# Patient Record
Sex: Male | Born: 1980 | Race: White | Hispanic: No | Marital: Married | Smoking: Current every day smoker
Health system: Southern US, Community
[De-identification: ages and names within clinical notes are randomized; demographics above are authoritative.]

## PROBLEM LIST (undated history)

## (undated) DIAGNOSIS — I2510 Atherosclerotic heart disease of native coronary artery without angina pectoris: Secondary | ICD-10-CM

## (undated) DIAGNOSIS — K219 Gastro-esophageal reflux disease without esophagitis: Secondary | ICD-10-CM

## (undated) DIAGNOSIS — M779 Enthesopathy, unspecified: Secondary | ICD-10-CM

## (undated) DIAGNOSIS — I739 Peripheral vascular disease, unspecified: Secondary | ICD-10-CM

## (undated) DIAGNOSIS — I6529 Occlusion and stenosis of unspecified carotid artery: Secondary | ICD-10-CM

## (undated) DIAGNOSIS — I219 Acute myocardial infarction, unspecified: Secondary | ICD-10-CM

## (undated) DIAGNOSIS — J939 Pneumothorax, unspecified: Secondary | ICD-10-CM

## (undated) DIAGNOSIS — I214 Non-ST elevation (NSTEMI) myocardial infarction: Secondary | ICD-10-CM

## (undated) HISTORY — DX: Enthesopathy, unspecified: M77.9

## (undated) HISTORY — DX: Non-ST elevation (NSTEMI) myocardial infarction: I21.4

## (undated) HISTORY — DX: Pneumothorax, unspecified: J93.9

## (undated) HISTORY — PX: APPENDECTOMY: SHX54

## (undated) HISTORY — DX: Gastro-esophageal reflux disease without esophagitis: K21.9

## (undated) HISTORY — DX: Occlusion and stenosis of unspecified carotid artery: I65.29

## (undated) HISTORY — DX: Atherosclerotic heart disease of native coronary artery without angina pectoris: I25.10

## (undated) HISTORY — DX: Acute myocardial infarction, unspecified: I21.9

---

## 2004-08-12 ENCOUNTER — Inpatient Hospital Stay: Payer: Self-pay | Admitting: Vascular Surgery

## 2010-05-25 ENCOUNTER — Emergency Department: Payer: Self-pay | Admitting: Internal Medicine

## 2010-06-18 ENCOUNTER — Inpatient Hospital Stay: Payer: Self-pay | Admitting: *Deleted

## 2010-07-08 ENCOUNTER — Ambulatory Visit: Payer: Self-pay | Admitting: Internal Medicine

## 2010-10-28 ENCOUNTER — Ambulatory Visit: Payer: Self-pay | Admitting: Internal Medicine

## 2011-04-22 DIAGNOSIS — I219 Acute myocardial infarction, unspecified: Secondary | ICD-10-CM

## 2011-04-22 HISTORY — DX: Acute myocardial infarction, unspecified: I21.9

## 2011-04-22 HISTORY — PX: CARDIAC CATHETERIZATION: SHX172

## 2011-04-22 HISTORY — PX: CORONARY ANGIOPLASTY: SHX604

## 2011-04-22 HISTORY — PX: CORONARY ARTERY BYPASS GRAFT: SHX141

## 2011-11-05 ENCOUNTER — Inpatient Hospital Stay: Payer: Self-pay | Admitting: Internal Medicine

## 2011-11-05 LAB — BASIC METABOLIC PANEL
Anion Gap: 11 (ref 7–16)
Creatinine: 1.02 mg/dL (ref 0.60–1.30)
EGFR (African American): >60
EGFR (Non-African Amer.): >60
Glucose: 101 mg/dL — ABNORMAL HIGH (ref 65–99)
Sodium: 141 mmol/L (ref 136–145)

## 2011-11-05 LAB — CBC
HCT: 48.5 % (ref 40.0–52.0)
HGB: 16.1 g/dL (ref 13.0–18.0)
MCH: 31.4 pg (ref 26.0–34.0)
MCV: 94 fL (ref 80–100)
Platelet: 201 10*3/uL (ref 150–440)
RBC: 5.14 10*6/uL (ref 4.40–5.90)
RDW: 12.2 % (ref 11.5–14.5)
WBC: 14.8 10*3/uL — ABNORMAL HIGH (ref 3.8–10.6)

## 2011-11-05 LAB — CK TOTAL AND CKMB (NOT AT ARMC): CK-MB: 2.1 ng/mL (ref 0.5–3.6)

## 2011-11-05 LAB — TROPONIN I: Troponin-I: 0.28 ng/mL — ABNORMAL HIGH

## 2011-11-12 ENCOUNTER — Encounter: Payer: Self-pay | Admitting: Cardiovascular Disease

## 2011-11-20 ENCOUNTER — Emergency Department: Payer: Self-pay | Admitting: Emergency Medicine

## 2011-11-20 HISTORY — PX: CARDIAC CATHETERIZATION: SHX172

## 2011-11-20 LAB — APTT: Activated PTT: 28.1 secs (ref 23.6–35.9)

## 2011-11-20 LAB — COMPREHENSIVE METABOLIC PANEL
Alkaline Phosphatase: 109 U/L (ref 50–136)
Calcium, Total: 9.2 mg/dL (ref 8.5–10.1)
Chloride: 104 mmol/L (ref 98–107)
Co2: 29 mmol/L (ref 21–32)
EGFR (African American): >60
EGFR (Non-African Amer.): >60
Glucose: 142 mg/dL — ABNORMAL HIGH (ref 65–99)
Osmolality: 282 (ref 275–301)
SGPT (ALT): 73 U/L
Sodium: 140 mmol/L (ref 136–145)

## 2011-11-20 LAB — CBC
HGB: 13.2 g/dL (ref 13.0–18.0)
MCH: 31.4 pg (ref 26.0–34.0)
MCHC: 34.1 g/dL (ref 32.0–36.0)
MCV: 92 fL (ref 80–100)
Platelet: 516 10*3/uL — ABNORMAL HIGH (ref 150–440)
RBC: 4.21 10*6/uL — ABNORMAL LOW (ref 4.40–5.90)
RDW: 12.5 % (ref 11.5–14.5)

## 2011-11-20 LAB — CK TOTAL AND CKMB (NOT AT ARMC)
CK, Total: 102 U/L (ref 35–232)
CK, Total: 90 U/L (ref 35–232)
CK-MB: <0.5 ng/mL — ABNORMAL LOW (ref 0.5–3.6)

## 2011-11-20 LAB — TROPONIN I: Troponin-I: 0.29 ng/mL — ABNORMAL HIGH

## 2011-11-20 LAB — PROTIME-INR: INR: 1

## 2011-11-27 ENCOUNTER — Encounter: Payer: Self-pay | Admitting: Cardiovascular Disease

## 2011-11-27 ENCOUNTER — Encounter: Payer: Self-pay | Admitting: *Deleted

## 2011-11-28 ENCOUNTER — Encounter: Payer: Self-pay | Admitting: Cardiovascular Disease

## 2011-11-28 ENCOUNTER — Ambulatory Visit (INDEPENDENT_AMBULATORY_CARE_PROVIDER_SITE_OTHER): Payer: 59 | Admitting: Cardiovascular Disease

## 2011-11-28 VITALS — BP 98/60 | HR 89 | Ht 74.0 in | Wt 252.8 lb

## 2011-11-28 DIAGNOSIS — I2510 Atherosclerotic heart disease of native coronary artery without angina pectoris: Secondary | ICD-10-CM | POA: Insufficient documentation

## 2011-11-28 DIAGNOSIS — Z951 Presence of aortocoronary bypass graft: Secondary | ICD-10-CM | POA: Insufficient documentation

## 2011-11-28 NOTE — Progress Notes (Signed)
Patient ID: Brandon Parks, male    DOB: [birth year], 31 y.o.   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 Balloon pump was placed and he was transferred to [facility] on Integrilin and heparin infusion. He underwent bypass surgery with LIMA to the LAD, vein graft to the OM, vein graft to the diagonal.  He developed recurrent chest pain/burning injury August and presented to [facility] and again was transferred to [facility] with repeat catheterization. He reports that something was "blocked up" but the details are not available to [place] at this time. He is recovering from his surgery. He denies any significant chest burning. 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  He is out of work, previously worked in Production designer, theatre/television/film. He does not have any further followup at [facility]. He was planning on being out of work for 2 months and then going back with light duty.  EKG today shows normal sinus rhythm with rate 18 beats per minute, T-wave abnormality V1 through V6, 1 and aVL   Outpatient Encounter Prescriptions as of 11/28/2011  Medication Sig Dispense Refill  . aspirin 81 MG tablet Take 81 mg by mouth daily.      Marland Kitchen atorvastatin (LIPITOR) 20 MG tablet Take 20 mg by mouth daily.      . clopidogrel (PLAVIX) 75 MG tablet Take 75 mg by mouth daily.      Marland Kitchen lisinopril (PRINIVIL,ZESTRIL) 2.5 MG tablet Take 2.5 mg by mouth daily.      . metoprolol tartrate (LOPRESSOR) 25 MG tablet Take 25 mg by mouth 2 (two) times daily.      . nitroGLYCERIN (NITROSTAT) 0.4 MG SL tablet Place 0.4 mg under the tongue every 5 (five) minutes as needed.      Marland Kitchen oxyCODONE (OXY IR/ROXICODONE) 5 MG immediate release tablet Takes 1-2 tablets every 6 hours as needed for pain.         Review of Systems  Constitutional: Negative.   HENT: Negative.   Eyes: Negative.   Respiratory: Negative.   Cardiovascular: Negative.   Gastrointestinal: Negative.   Musculoskeletal: Negative.   Skin: Negative.   Neurological: Negative.   Hematological: Negative.   Psychiatric/Behavioral: Negative.   All other systems reviewed and are negative.    BP 98/60  Pulse 89  Ht 6\' 2"  (1.88 m)  Wt 252 lb 12 oz (114.647 kg)  BMI 32.45 kg/m2  Physical Exam  Nursing note and vitals reviewed. Constitutional: He is oriented to person, place, and time. He appears well-developed and well-nourished.  HENT:  Head: Normocephalic.  Nose: Nose normal.  Mouth/Throat: Oropharynx is clear and moist.  Eyes: Conjunctivae are normal. Pupils are equal, round, and reactive to light.  Neck: Normal range of motion. Neck supple. No JVD present.  Cardiovascular: Normal rate, regular rhythm, S1 normal, S2 normal, normal heart sounds and intact distal pulses.  Exam reveals no gallop and no friction rub.   No murmur heard.      Well-healed mediastinal scar  Pulmonary/Chest: Effort normal and breath sounds normal. No respiratory distress. He has no wheezes. He has no rales. He exhibits no tenderness.  Abdominal: Soft. Bowel sounds are normal. He exhibits no distension. There is no tenderness.  Musculoskeletal: Normal range of motion. He exhibits no edema and no  tenderness.  Lymphadenopathy:    He has no cervical adenopathy.  Neurological: He is alert and oriented to person, place, and time. Coordination normal.  Skin: Skin is warm and dry. No rash noted. No erythema.  Psychiatric: He has a normal mood and affect. His behavior is normal. Judgment and thought content normal.           Assessment and Plan

## 2011-11-28 NOTE — Assessment & Plan Note (Signed)
Initial catheterization showing complex ostial and proximal LAD stenosis and extensive thrombus into the left main. Treated with bypass surgery. Currently with no chest burning

## 2011-11-28 NOTE — Assessment & Plan Note (Addendum)
July 2013 he had CABG x3. Catheterization in August 2013. Results are not available. We will try to obtain this for our records. He will likely need to be out of work for several months, then back on light duty. Possible full work at 3 months.

## 2011-11-28 NOTE — Patient Instructions (Addendum)
You are doing well. No medication changes were made.  We will set up heart track at [facility]  Please call us if you have new issues that need to be addressed before your next appt.  Your physician wants you to follow-up in: 3 months.  You will receive a reminder letter in the mail two months in advance. If you don't receive a letter, please call our office to schedule the follow-up appointment.

## 2011-12-02 ENCOUNTER — Telehealth: Payer: Self-pay | Admitting: Cardiovascular Disease

## 2011-12-02 ENCOUNTER — Other Ambulatory Visit: Payer: Self-pay

## 2011-12-02 MED ORDER — VARENICLINE TARTRATE 0.5 MG X 11 & 1 MG X 42 PO MISC: ORAL | Status: DC

## 2011-12-02 NOTE — Telephone Encounter (Signed)
Rx sent to [facility] and Mrs Cantara was notified.

## 2011-12-02 NOTE — Telephone Encounter (Signed)
Pt called wanting to know if he could get a script for Chantix. [facility] [address] [place]. Please call pt wife if called in [phone].

## 2011-12-03 ENCOUNTER — Other Ambulatory Visit: Payer: Self-pay | Admitting: *Deleted

## 2011-12-03 MED ORDER — VARENICLINE TARTRATE 0.5 MG X 11 & 1 MG X 42 PO MISC: ORAL | Status: AC

## 2011-12-03 NOTE — Telephone Encounter (Signed)
Refilled Chantix

## 2011-12-09 ENCOUNTER — Ambulatory Visit: Payer: Self-pay | Admitting: Cardiovascular Disease

## 2011-12-10 ENCOUNTER — Ambulatory Visit: Payer: Self-pay | Admitting: Cardiovascular Disease

## 2011-12-11 ENCOUNTER — Telehealth: Payer: Self-pay

## 2011-12-11 NOTE — Telephone Encounter (Signed)
Pt had CABG x 1 month ago at [facility]. He c/o lower portion of sternal incision being "opened" and draining. He says drainage was at first, clear but now is white.  I advised him to call [facility] office ASAP to tell them what is going on. I explained he probably needs to be seen. I gave him their #. He verb. Understanding and will call them now.

## 2012-04-08 ENCOUNTER — Telehealth: Payer: Self-pay | Admitting: Cardiovascular Disease

## 2012-04-08 NOTE — Telephone Encounter (Signed)
Pt wife called wanting to know if pt is ok to get into hot tub and has other questions for nurse

## 2012-04-08 NOTE — Telephone Encounter (Signed)
Pts wife asks if ok for pt to get in hot tub.  I advised against this, given the fact that he is not quite 6 months post CABG and is taking beta blockers. She verb. Understanding. She also asks if pt is cleared for cortisone injections in shoulder for tendonitis.  She reminds me pt is hesitant since he was on prednisone "when he had his heart attack". I told wife I would discuss with Dr. Mariah Milling and let her know if he is cleared to have cortisone injections. Understanding verb

## 2012-04-08 NOTE — Telephone Encounter (Signed)
Pt wife called wanting to speak to nurse. Ask if ok for pt to get into hot tub after CABG

## 2012-04-14 NOTE — Telephone Encounter (Signed)
Cortisone should be ok Should time in hot tub ok, 5 to 10 min, not too hot

## 2012-04-15 NOTE — Telephone Encounter (Signed)
Pt's wife informed Understanding verb

## 2012-05-26 ENCOUNTER — Ambulatory Visit: Payer: 59 | Admitting: Cardiovascular Disease

## 2012-05-26 ENCOUNTER — Encounter: Payer: Self-pay | Admitting: *Deleted

## 2012-06-11 ENCOUNTER — Ambulatory Visit: Payer: 59 | Admitting: Cardiovascular Disease

## 2012-06-11 ENCOUNTER — Ambulatory Visit (INDEPENDENT_AMBULATORY_CARE_PROVIDER_SITE_OTHER): Payer: 59 | Admitting: Cardiovascular Disease

## 2012-06-11 ENCOUNTER — Encounter: Payer: Self-pay | Admitting: Cardiovascular Disease

## 2012-06-11 VITALS — BP 112/88 | HR 78 | Ht 74.0 in | Wt 269.0 lb

## 2012-06-11 DIAGNOSIS — I25810 Atherosclerosis of coronary artery bypass graft(s) without angina pectoris: Secondary | ICD-10-CM

## 2012-06-11 DIAGNOSIS — Z79899 Other long term (current) drug therapy: Secondary | ICD-10-CM

## 2012-06-11 DIAGNOSIS — E785 Hyperlipidemia, unspecified: Secondary | ICD-10-CM | POA: Insufficient documentation

## 2012-06-11 DIAGNOSIS — I10 Essential (primary) hypertension: Secondary | ICD-10-CM

## 2012-06-11 DIAGNOSIS — Z951 Presence of aortocoronary bypass graft: Secondary | ICD-10-CM

## 2012-06-11 DIAGNOSIS — R079 Chest pain, unspecified: Secondary | ICD-10-CM

## 2012-06-11 MED ORDER — ATORVASTATIN CALCIUM 20 MG PO TABS: 20.0000 mg | ORAL_TABLET | Freq: Every day | ORAL | Status: DC

## 2012-06-11 MED ORDER — LISINOPRIL 2.5 MG PO TABS: 2.5000 mg | ORAL_TABLET | Freq: Every day | ORAL | Status: DC

## 2012-06-11 MED ORDER — CLOPIDOGREL BISULFATE 75 MG PO TABS: 75.0000 mg | ORAL_TABLET | Freq: Every day | ORAL | Status: DC

## 2012-06-11 MED ORDER — METOPROLOL TARTRATE 25 MG PO TABS: 25.0000 mg | ORAL_TABLET | Freq: Two times a day (BID) | ORAL | Status: DC

## 2012-06-11 MED ORDER — NITROGLYCERIN 0.4 MG SL SUBL: 0.4000 mg | SUBLINGUAL_TABLET | SUBLINGUAL | Status: DC | PRN

## 2012-06-11 NOTE — Assessment & Plan Note (Signed)
Rare brief chest pain. We have suggested it pain scale more frequent or more severe, lasting longer, he take nitroglycerin sublingual and call the office.

## 2012-06-11 NOTE — Assessment & Plan Note (Signed)
Continue his current cardiac regimen. He is on aspirin, Plavix, beta blocker, low-dose ACE inhibitor.

## 2012-06-11 NOTE — Assessment & Plan Note (Signed)
We'll check lipid panel and LFTs today

## 2012-06-11 NOTE — Progress Notes (Signed)
Patient ID: Brandon Parks, male    DOB: 07-30-1980, 32 y.o.   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 Balloon pump was placed and he was transferred to [facility] on Integrilin and heparin infusion. He underwent bypass surgery with LIMA to the LAD, vein graft to the OM, vein graft to the diagonal.   recurrent chest pain/burning injury August  2013 and presented to [facility] and again was transferred to [facility] with repeat catheterization. He reports that something was "blocked up" but the details are not available to [place]. He  recovered from his surgery. He denies any significant chest burning. 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  He does report having periodic discomfort from the stent mediastinal incision. He often feels he is back to normal and lifts something heavy. He reports lifting a 4 wheeler by himself and he hurt his chest and has not been back at work since then, only doing light work. He has numbness down his left leg when he is on the ground for prolonged period of time.  Possibly still smoking a small amount, using nicotine supplement  EKG today shows normal sinus rhythm with rate 78 beats per minute, T-wave abnormality 1 and aVL   Outpatient Encounter Prescriptions as of 06/11/2012  Medication Sig Dispense Refill  . aspirin 81 MG tablet Take 81 mg by mouth daily.      Marland Kitchen atorvastatin (LIPITOR) 20 MG tablet Take 1 tablet (20 mg total) by mouth daily.  90 tablet  4  . clopidogrel (PLAVIX) 75 MG tablet Take 1 tablet (75 mg total) by mouth daily.  90 tablet  4  . lisinopril (PRINIVIL,ZESTRIL) 2.5 MG tablet Take 1 tablet (2.5 mg total) by mouth daily.  90 tablet  4  . metoprolol tartrate (LOPRESSOR) 25 MG tablet  Take 1 tablet (25 mg total) by mouth 2 (two) times daily.  180 tablet  4  . nitroGLYCERIN (NITROSTAT) 0.4 MG SL tablet Place 1 tablet (0.4 mg total) under the tongue every 5 (five) minutes as needed.  25 tablet  6     Review of Systems  Constitutional: Negative.   HENT: Negative.   Eyes: Negative.   Respiratory: Negative.   Cardiovascular: Positive for chest pain.  Gastrointestinal: Negative.   Musculoskeletal: Negative.   Skin: Negative.   Neurological: Negative.   Psychiatric/Behavioral: Negative.   All other systems reviewed and are negative.    BP 112/88  Pulse 78  Ht 6\' 2"  (1.88 m)  Wt 269 lb (122.018 kg)  BMI 34.52 kg/m2  Physical Exam  Nursing note and vitals reviewed. Constitutional: He is oriented to person, place, and time. He appears well-developed and well-nourished.  HENT:  Head: Normocephalic.  Nose: Nose normal.  Mouth/Throat: Oropharynx is clear and moist.  Eyes: Conjunctivae are normal. Pupils are equal, round, and reactive to light.  Neck: Normal range of motion. Neck supple. No JVD present.  Cardiovascular: Normal rate, regular rhythm, S1 normal, S2 normal, normal heart sounds and intact distal pulses.  Exam reveals no gallop and no friction rub.   No murmur heard. Well-healed mediastinal scar  Pulmonary/Chest: Effort normal and breath sounds normal. No respiratory distress. He has no wheezes. He has no rales. He exhibits no tenderness.  Abdominal: Soft. Bowel  sounds are normal. He exhibits no distension. There is no tenderness.  Musculoskeletal: Normal range of motion. He exhibits no edema and no tenderness.  Lymphadenopathy:    He has no cervical adenopathy.  Neurological: He is alert and oriented to person, place, and time. Coordination normal.  Skin: Skin is warm and dry. No rash noted. No erythema.  Psychiatric: He has a normal mood and affect. His behavior is normal. Judgment and thought content normal.      Assessment and Plan

## 2012-06-11 NOTE — Patient Instructions (Addendum)
You are doing well. No medication changes were made.  We will check your lab work today  Please call us if you have new issues that need to be addressed before your next appt.  Your physician wants you to follow-up in: 6 months.  You will receive a reminder letter in the mail two months in advance. If you don't receive a letter, please call our office to schedule the follow-up appointment.

## 2012-06-18 ENCOUNTER — Telehealth: Payer: Self-pay

## 2012-06-18 NOTE — Telephone Encounter (Signed)
Message copied by Festus Aloe on Fri Jun 18, 2012  9:36 AM ------      Message from: [facility], MELISSA E      Created: Thu Jun 17, 2012  8:07 AM      Regarding: FW: labs                   ----- Message -----         From: Antonieta Iba, MD         Sent: 06/16/2012   6:17 PM           To: Marcelle Overlie, RN      Subject: RE: labs                                                 Cholesterol is okay on current dose of Lipitor      thx            ----- Message -----         From: Marcelle Overlie, RN         Sent: 06/14/2012   4:56 PM           To: Antonieta Iba, MD      Subject: labs                                                     fyi      Labs scanned in       ------

## 2012-06-18 NOTE — Telephone Encounter (Signed)
Patient notified lab work looks good and to continue on the current dose of lipitor.

## 2012-10-12 ENCOUNTER — Telehealth: Payer: Self-pay

## 2012-10-12 NOTE — Telephone Encounter (Signed)
Request from [facility], sent to [employer] on 10/12/2012 .

## 2012-10-27 ENCOUNTER — Telehealth: Payer: Self-pay

## 2012-10-27 NOTE — Telephone Encounter (Signed)
Request from [employer], sent to [employer] on 10/28/2012 .

## 2012-11-12 ENCOUNTER — Telehealth: Payer: Self-pay

## 2012-11-12 NOTE — Telephone Encounter (Signed)
Request from [employer]ability [employer] , sent to [employer] on 11/12/2012 .

## 2012-12-10 ENCOUNTER — Ambulatory Visit: Payer: 59 | Admitting: Cardiovascular Disease

## 2012-12-30 ENCOUNTER — Ambulatory Visit: Payer: Self-pay | Admitting: Cardiovascular Disease

## 2012-12-30 ENCOUNTER — Ambulatory Visit (INDEPENDENT_AMBULATORY_CARE_PROVIDER_SITE_OTHER): Payer: 59 | Admitting: Cardiovascular Disease

## 2012-12-30 ENCOUNTER — Encounter: Payer: Self-pay | Admitting: Cardiovascular Disease

## 2012-12-30 VITALS — BP 131/95 | HR 77 | Ht 74.0 in | Wt 266.8 lb

## 2012-12-30 DIAGNOSIS — I10 Essential (primary) hypertension: Secondary | ICD-10-CM

## 2012-12-30 DIAGNOSIS — E785 Hyperlipidemia, unspecified: Secondary | ICD-10-CM

## 2012-12-30 DIAGNOSIS — R079 Chest pain, unspecified: Secondary | ICD-10-CM

## 2012-12-30 DIAGNOSIS — Z951 Presence of aortocoronary bypass graft: Secondary | ICD-10-CM

## 2012-12-30 DIAGNOSIS — R0789 Other chest pain: Secondary | ICD-10-CM | POA: Insufficient documentation

## 2012-12-30 NOTE — Progress Notes (Signed)
Patient ID: Brandon Parks, male    DOB: 1980/11/22, 32 y.o.   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 Balloon pump was placed and he was transferred to [facility] on Integrilin and heparin infusion. He underwent bypass surgery with LIMA to the LAD, vein graft to the OM, vein graft to the diagonal.  He developed recurrent chest pain August  2013 and presented to [facility] and again was transferred to [facility] with repeat catheterization. He reports that something was "blocked up" but the details are not available to [place]. He  recovered from his surgery. He denies any significant chest burning. 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  He has had discomfort from the mediastinal incision  Starting approximately 2 months after the surgery. He reports that he was lifting something heavy and he hurt his mediastinum. Also hurt his chest a second time lifting a 4 wheeler by himself. He has pain when he stretches his arms out to his side. Pain is excruciating, left of the mediastinum, halfway down. Sometimes has pain with pushing, often with movement. He has pain daily, often several times per day  Possibly still smoking a small amount, using nicotine supplement  EKG today shows normal sinus rhythm with rate 78 beats per minute, T-wave abnormality 1 and aVL  Outpatient Encounter Prescriptions as of 12/30/2012  Medication Sig Dispense Refill  . aspirin 81 MG tablet Take 81 mg by mouth daily.      Marland Kitchen atorvastatin (LIPITOR) 20 MG tablet Take 1 tablet (20 mg total) by mouth daily.  90 tablet  4  . clopidogrel (PLAVIX) 75 MG tablet Take 1 tablet (75 mg total) by mouth daily.  90 tablet  4  . lisinopril (PRINIVIL,ZESTRIL) 2.5 MG tablet Take 1  tablet (2.5 mg total) by mouth daily.  90 tablet  4  . metoprolol tartrate (LOPRESSOR) 25 MG tablet Take 1 tablet (25 mg total) by mouth 2 (two) times daily.  180 tablet  4  . nitroGLYCERIN (NITROSTAT) 0.4 MG SL tablet Place 1 tablet (0.4 mg total) under the tongue every 5 (five) minutes as needed.  25 tablet  6  . oxyCODONE (OXY IR/ROXICODONE) 5 MG immediate release tablet Takes 1-2 tablets every 6 hours as needed for pain.       No facility-administered encounter medications on file as of 12/30/2012.     Review of Systems  Constitutional: Negative.   HENT: Negative.   Eyes: Negative.   Respiratory: Negative.   Cardiovascular: Positive for chest pain.  Gastrointestinal: Negative.   Musculoskeletal: Negative.   Skin: Negative.   Neurological: Negative.   Psychiatric/Behavioral: Negative.   All other systems reviewed and are negative.    BP 131/95  Pulse 77  Ht 6\' 2"  (1.88 m)  Wt 266 lb 12 oz (120.997 kg)  BMI 34.23 kg/m2  Physical Exam  Nursing note and vitals reviewed. Constitutional: He is oriented to person, place, and time. He appears well-developed and well-nourished.  HENT:  Head: Normocephalic.  Nose: Nose normal.  Mouth/Throat: Oropharynx is clear and moist.  Eyes: Conjunctivae are normal. Pupils are equal, round, and reactive to light.  Neck: Normal range of motion. Neck supple. No JVD present.  Cardiovascular: Normal rate, regular rhythm, S1 normal, S2 normal, normal heart  sounds and intact distal pulses.  Exam reveals no gallop and no friction rub.   No murmur heard. Well-healed mediastinal scar  Pulmonary/Chest: Effort normal and breath sounds normal. No respiratory distress. He has no wheezes. He has no rales. He exhibits no tenderness.  Abdominal: Soft. Bowel sounds are normal. He exhibits no distension. There is no tenderness.  Musculoskeletal: Normal range of motion. He exhibits no edema and no tenderness.  Lymphadenopathy:    He has no cervical  adenopathy.  Neurological: He is alert and oriented to person, place, and time. Coordination normal.  Skin: Skin is warm and dry. No rash noted. No erythema.  Psychiatric: He has a normal mood and affect. His behavior is normal. Judgment and thought content normal.      Assessment and Plan

## 2012-12-30 NOTE — Assessment & Plan Note (Signed)
He does report having vague daily chest pain which he feels is his heart, separate to his sternum pain. Options include long-acting nitrates for possible spasm, we'll also try ranexa 500 mg twice a day titrating up to 1000 mg twice a day. If no improvement on ranexa, we could hold the medication and start Imdur 30 mg daily.

## 2012-12-30 NOTE — Assessment & Plan Note (Signed)
No changes made to the medications.

## 2012-12-30 NOTE — Assessment & Plan Note (Signed)
Encouraged him to stay on his generic Lipitor. 

## 2012-12-30 NOTE — Assessment & Plan Note (Signed)
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 We will order a x-ray of his sternum.

## 2012-12-30 NOTE — Patient Instructions (Addendum)
We will take an Xray of your chest to look for "mal-union of the sternum" Please take the order over to the outpatient imaging center on [address]  Please start ranexa 500 nmg tceia a day for one week, Then increase to 1000 mg twice a day This medication is for the cardiac chest pains  Please call us if you have new issues that need to be addressed before your next appt.  Your physician wants you to follow-up in: 6 months.  You will receive a reminder letter in the mail two months in advance. If you don't receive a letter, please call our office to schedule the follow-up appointment.

## 2012-12-31 ENCOUNTER — Encounter: Payer: Self-pay | Admitting: Cardiovascular Disease

## 2012-12-31 ENCOUNTER — Telehealth: Payer: Self-pay

## 2012-12-31 NOTE — Telephone Encounter (Signed)
Spoke w/ pt.  Per Dr. Mariah Milling, called to inform pt of normal chest x-ray results.  Instructed pt that if pain still persists, we can order a CT. Pt reports that he is still having "quite a bit of pain in about a 3 inch circle on his chest." He is very concerned about the cost of a CT and "if he even wants to know what's wrong", as it may incur even more cost "in the long run." Instructed pt to think about it over the weekend and call me Monday. He is agreeable to this and will "try to figure something out to pay for it" and give me a date that would be good for him.

## 2013-04-20 ENCOUNTER — Telehealth: Payer: Self-pay

## 2013-04-20 NOTE — Telephone Encounter (Signed)
Request from disability determination services, sent to [employer] on 04/22/2012 .

## 2013-04-22 ENCOUNTER — Telehealth: Payer: Self-pay

## 2013-04-22 NOTE — Telephone Encounter (Signed)
Spoke w/ pt and informed him that I would leave paperwork at the front desk for pt to p/u. He states that he will have his wife pick it up for him.

## 2013-04-22 NOTE — Telephone Encounter (Signed)
Spoke w/ pt regarding paperwork received from [employer] screening results appeal form.   Pt reports that he was previously prescribed Chantix by Dr. Mariah MillingGollan but did not take it at that time. He reports that he recently started and is currently finishing up his 1st week.  He would like to be notified when it is signed so that he can pick it up.

## 2013-04-27 ENCOUNTER — Telehealth: Payer: Self-pay

## 2013-04-27 NOTE — Telephone Encounter (Signed)
Request from Disability [employer], sent to [employer] on 04/27/2013.  

## 2013-07-13 ENCOUNTER — Telehealth: Payer: Self-pay

## 2013-07-13 NOTE — Telephone Encounter (Signed)
Request from [employer], sent to [employer] on 07/14/2013.

## 2013-08-08 ENCOUNTER — Telehealth: Payer: Self-pay | Admitting: *Deleted

## 2013-08-08 DIAGNOSIS — M9689 Other intraoperative and postprocedural complications and disorders of the musculoskeletal system: Secondary | ICD-10-CM

## 2013-08-08 NOTE — Telephone Encounter (Signed)
Patient called and stating that bone on front of chest and having lots of pain. Dr. Mariah MillingGollan wanted a ct in the past. Patient wondering if he can order a ct scan.

## 2013-08-09 NOTE — Telephone Encounter (Signed)
Okay to order chest CT with no contrast To look for nonunion of the sternum following bypass surgery

## 2013-08-09 NOTE — Telephone Encounter (Signed)
Spoke w/ pt.  He reports that his sternum is becoming more painful, esp to the touch. He was offered to sched CT as last ov, but he declined due to the cost. He would like to see if this can be done on Friday. What type of CT would you like him to have?

## 2013-08-09 NOTE — Telephone Encounter (Signed)
Spoke w/ pt.  He is aware that he is sched for Chest CT w/o contrast on Fri, 06/14/13 @ 9:30, [facility] @ 9:15.

## 2013-08-09 NOTE — Telephone Encounter (Signed)
Left message for pt to call back  °

## 2013-08-12 ENCOUNTER — Telehealth: Payer: Self-pay

## 2013-08-12 NOTE — Telephone Encounter (Signed)
We will need to wait and preapprove the CT If he is severe pain, he could go to the emergency room If he does have chronic pain like this, he may need to see the chronic pain clinic or a CT surgeon for possible evaluation

## 2013-08-12 NOTE — Telephone Encounter (Signed)
Spoke w/ pt.  He reports that his CT was cancelled for today, as his ins needs "peer review approval" in order to cover this test. He does not understand this, as he reports that he is usually responsible for the majority of the cost of his testing thru this ins.  Pt states that his pain has worsened since his last ov, when he raises his arm, "feels like it's pulling on my ribs". Pt admits that he has fallen a few times while helping some friends since his last visit, and thinks he may have aggravated sx. He states that he does not want to have his chest "cracked open again", but he would like Dr. Mariah MillingGollan to be able to see what is going on so he can proceed w/ the next step. He would like to know if he should reschedule CT or if there is another test that can be done to give a good pic of his sternum.  Please advise.  Thank you.

## 2013-08-12 NOTE — Telephone Encounter (Signed)
Spoke w/ pt.  Advised him of Dr. Windell HummingbirdGollan's recommendations.  He states that he would prefer to wait until ins will pay for this test and have it rescheduled thru us.  He would also like to know if we can refer him to a surgeon, as he would like to have this issue fixed, rather than just treating the pain.  Advised him that if the pain worsens before pre-cert is obtained, to go to the ED.  He admits that he has considered this before, but he does not want to wait in the ED for that long.  Pt does not have PCP.  Please advise on who you recommend he see. Thank you.

## 2013-08-12 NOTE — Telephone Encounter (Signed)
Left message for pt to call back  °

## 2013-08-15 NOTE — Telephone Encounter (Signed)
Would wait on CT results before surgical referral If no "malunion" would need pain clinic and not surgery as nothing to fix

## 2013-08-19 ENCOUNTER — Encounter: Payer: Self-pay | Admitting: Cardiovascular Disease

## 2013-08-19 ENCOUNTER — Ambulatory Visit (INDEPENDENT_AMBULATORY_CARE_PROVIDER_SITE_OTHER): Payer: 59 | Admitting: Cardiovascular Disease

## 2013-08-19 VITALS — BP 110/84 | HR 82 | Ht 74.0 in | Wt 273.8 lb

## 2013-08-19 DIAGNOSIS — M9689 Other intraoperative and postprocedural complications and disorders of the musculoskeletal system: Secondary | ICD-10-CM

## 2013-08-19 DIAGNOSIS — Z951 Presence of aortocoronary bypass graft: Secondary | ICD-10-CM

## 2013-08-19 DIAGNOSIS — E785 Hyperlipidemia, unspecified: Secondary | ICD-10-CM

## 2013-08-19 DIAGNOSIS — R0789 Other chest pain: Secondary | ICD-10-CM

## 2013-08-19 DIAGNOSIS — R079 Chest pain, unspecified: Secondary | ICD-10-CM

## 2013-08-19 DIAGNOSIS — I2510 Atherosclerotic heart disease of native coronary artery without angina pectoris: Secondary | ICD-10-CM

## 2013-08-19 NOTE — Telephone Encounter (Signed)
Chest CT sched for 08/22/13 at 4:00 Approval # :  [id number] Exp:   10/03/13

## 2013-08-19 NOTE — Assessment & Plan Note (Signed)
Encouraged him to continue on his medications

## 2013-08-19 NOTE — Patient Instructions (Addendum)
We will reschedule your Chest CT:  Monday, May 4 @ 4:00 on [address]  and call you with the results.  We will refer you to the pain clinic:  Your records have been sent to them for review and they contact you with an appt time.   Call or return to clinic prn if these symptoms worsen or fail to improve as anticipated.

## 2013-08-19 NOTE — Assessment & Plan Note (Signed)
Currently with no symptoms of angina. No further workup at this time. Continue current medication regimen. 

## 2013-08-19 NOTE — Progress Notes (Signed)
Patient ID: Brandon LoreRobert Parks, male    DOB: 12/18/1980, 33 y.o.   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 Balloon pump was placed and he was transferred to [facility] on Integrilin and heparin infusion. He underwent bypass surgery with LIMA to the LAD, vein graft to the OM, vein graft to the diagonal.  He developed recurrent chest pain August  2013 and presented to [facility] and again was transferred [facility] with repeat catheterization. He reports that something was "blocked up" but the details are not available to us. He  recovered from his surgery. He denies any significant chest burning. 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  On his prior clinic visit, he reported having discomfort from the mediastinal incision  Starting approximately 2 months after the surgery. He reports that he was lifting something heavy and he hurt his mediastinum. Today he reports that he was lifting a carpet over his head . He did have acute pain at the time in the area left of his mediastinum . Also hurt his chest a second time lifting a 4 wheeler by himself.  He continues to have pain when he stretches his arms out to his side. Pain is excruciating, left of the mediastinum, halfway down. Sometimes has pain with pushing, often with movement. He has pain daily, often several times per day. He feels the pain has been getting worse over the past several months.  Recent x-ray of his sternum did not show wire fracture. CT scan was recommended  Possibly still smoking a small amount, using nicotine supplement  EKG today shows normal sinus rhythm with rate 82 beats per minute, T-wave abnormality 1 and aVL  Outpatient Encounter Prescriptions as of  08/19/2013  Medication Sig  . aspirin 81 MG tablet Take 81 mg by mouth daily.  Marland Kitchen. atorvastatin (LIPITOR) 20 MG tablet Take 1 tablet (20 mg total) by mouth daily.  . clopidogrel (PLAVIX) 75 MG tablet Take 1 tablet (75 mg total) by mouth daily.  Marland Kitchen. lisinopril (PRINIVIL,ZESTRIL) 2.5 MG tablet Take 1 tablet (2.5 mg total) by mouth daily.  . metoprolol tartrate (LOPRESSOR) 25 MG tablet Take 1 tablet (25 mg total) by mouth 2 (two) times daily.  . nitroGLYCERIN (NITROSTAT) 0.4 MG SL tablet Place 1 tablet (0.4 mg total) under the tongue every 5 (five) minutes as needed.  Marland Kitchen. oxyCODONE (OXY IR/ROXICODONE) 5 MG immediate release tablet Takes 1-2 tablets every 6 hours as needed for pain.    Review of Systems  Constitutional: Negative.   HENT: Negative.   Eyes: Negative.   Respiratory: Negative.   Cardiovascular: Positive for chest pain.  Gastrointestinal: Negative.   Endocrine: Negative.   Musculoskeletal: Negative.   Skin: Negative.   Allergic/Immunologic: Negative.   Neurological: Negative.   Hematological: Negative.   Psychiatric/Behavioral: Negative.   All other systems reviewed and are negative.   BP 110/84  Pulse 82  Ht 6\' 2"  (1.88 m)  Wt 273 lb 12 oz (124.172 kg)  BMI 35.13 kg/m2  Physical Exam  Nursing note and vitals reviewed. Constitutional: He is oriented to person, place, and time. He appears well-developed and well-nourished.  HENT:  Head: Normocephalic.  Nose: Nose normal.  Mouth/Throat: Oropharynx is clear and moist.  Eyes: Conjunctivae are normal. Pupils are  equal, round, and reactive to light.  Neck: Normal range of motion. Neck supple. No JVD present.  Cardiovascular: Normal rate, regular rhythm, S1 normal, S2 normal, normal heart sounds and intact distal pulses.  Exam reveals no gallop and no friction rub.   No murmur heard. Well-healed mediastinal scar  Pulmonary/Chest: Effort normal and breath sounds normal. No respiratory distress. He has no wheezes. He has no  rales. He exhibits no tenderness.  Abdominal: Soft. Bowel sounds are normal. He exhibits no distension. There is no tenderness.  Musculoskeletal: Normal range of motion. He exhibits no edema and no tenderness.  Pain with pushing on the left side of his mediastinum  Lymphadenopathy:    He has no cervical adenopathy.  Neurological: He is alert and oriented to person, place, and time. Coordination normal.  Skin: Skin is warm and dry. No rash noted. No erythema.  Psychiatric: He has a normal mood and affect. His behavior is normal. Judgment and thought content normal.      Assessment and Plan

## 2013-08-19 NOTE — Assessment & Plan Note (Signed)
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 Wires are intact on chest x-ray. CT scan of the chest has been ordered and approved. Also referred him to pain clinic. Uncertain if he would be a candidate for localized pain therapy, nerve block.

## 2013-08-22 ENCOUNTER — Ambulatory Visit: Payer: Self-pay | Admitting: Cardiovascular Disease

## 2013-08-23 ENCOUNTER — Other Ambulatory Visit: Payer: Self-pay

## 2013-08-23 DIAGNOSIS — R079 Chest pain, unspecified: Secondary | ICD-10-CM

## 2013-08-23 DIAGNOSIS — M9689 Other intraoperative and postprocedural complications and disorders of the musculoskeletal system: Secondary | ICD-10-CM

## 2013-09-20 ENCOUNTER — Telehealth: Payer: Self-pay

## 2013-09-20 NOTE — Telephone Encounter (Signed)
Pt would like CT results

## 2013-09-20 NOTE — Telephone Encounter (Signed)
Reviewed CT results w/ pt.  He reports continued "intense", daily pain that is interfering w/ his day-to-day activities.  He does not have a PCP and states that going to the pain clinic is "costing me a lot of money, I can't work and I still don't know what's wrong". Pt would like to know how to proceed from here.  Please advise.  Thank you.

## 2013-09-20 NOTE — Telephone Encounter (Signed)
CT did not show any findings that would explain his chest pain He was clearly musculoskeletal, noncardiac Do not know where that leads [place] other than the pain clinic as they can try to alleviate his pain with a shot or other treatment  No further cardiac testing needed as pain is musculoskeletal No surgeon would do surgery given the findings of well healed sternum He could certainly meet with the surgeon who did the surgery but they would want to see his CT results They would likely not do any surgery unless they found something that was missed

## 2013-09-21 NOTE — Telephone Encounter (Signed)
Spoke w/ pt.  Advised him of Dr. Windell Hummingbird recommendation.  He verbalizes understanding and will call if we can be of further assistance.

## 2013-09-28 ENCOUNTER — Ambulatory Visit: Payer: Self-pay | Admitting: Pain Medicine

## 2013-09-29 ENCOUNTER — Ambulatory Visit: Payer: Self-pay | Admitting: Pain Medicine

## 2013-11-02 ENCOUNTER — Ambulatory Visit: Payer: Self-pay | Admitting: Pain Medicine

## 2014-02-03 ENCOUNTER — Ambulatory Visit (INDEPENDENT_AMBULATORY_CARE_PROVIDER_SITE_OTHER): Payer: 59 | Admitting: Cardiovascular Disease

## 2014-02-03 ENCOUNTER — Encounter: Payer: Self-pay | Admitting: Cardiovascular Disease

## 2014-02-03 VITALS — BP 110/90 | HR 72 | Ht 74.0 in | Wt 280.8 lb

## 2014-02-03 DIAGNOSIS — R079 Chest pain, unspecified: Secondary | ICD-10-CM

## 2014-02-03 DIAGNOSIS — I10 Essential (primary) hypertension: Secondary | ICD-10-CM

## 2014-02-03 DIAGNOSIS — R0789 Other chest pain: Secondary | ICD-10-CM

## 2014-02-03 DIAGNOSIS — Z951 Presence of aortocoronary bypass graft: Secondary | ICD-10-CM

## 2014-02-03 DIAGNOSIS — E785 Hyperlipidemia, unspecified: Secondary | ICD-10-CM

## 2014-02-03 DIAGNOSIS — I25119 Atherosclerotic heart disease of native coronary artery with unspecified angina pectoris: Secondary | ICD-10-CM

## 2014-02-03 MED ORDER — ATORVASTATIN CALCIUM 20 MG PO TABS: 20.0000 mg | ORAL_TABLET | Freq: Every day | ORAL | Status: DC

## 2014-02-03 MED ORDER — CLOPIDOGREL BISULFATE 75 MG PO TABS: 75.0000 mg | ORAL_TABLET | Freq: Every day | ORAL | Status: DC

## 2014-02-03 MED ORDER — NITROGLYCERIN 0.4 MG SL SUBL: 0.4000 mg | SUBLINGUAL_TABLET | SUBLINGUAL | Status: DC | PRN

## 2014-02-03 MED ORDER — METOPROLOL TARTRATE 25 MG PO TABS: 25.0000 mg | ORAL_TABLET | Freq: Two times a day (BID) | ORAL | Status: DC

## 2014-02-03 NOTE — Assessment & Plan Note (Addendum)
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 No further workup at this time. Continue current medication regimen. We have offered isosorbide mononitrate 30 mg daily. He prefers to take metoprolol. He we'll restart his Plavix in addition to his aspirin

## 2014-02-03 NOTE — Progress Notes (Signed)
Patient ID: Ruben ImRobert N Palos, male    DOB: 03/15/1981, 33 y.o.   MRN: [id number]  HPI Comments: Mr. 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 Balloon pump was placed and he was transferred to [facility] on Integrilin and heparin infusion. He underwent bypass surgery with LIMA to the LAD, vein graft to the OM, vein graft to the diagonal.  He developed recurrent chest pain August  2013 and presented [facility] and again was [facility] with repeat catheterization. He reports that something was "blocked up" but the details are not available to us. He  recovered from his surgery. He denies any significant chest burning. 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  In followup, overall he feels well. He continues to have occasional chest pain symptoms, sometimes at rest, sometimes with exertion. This has been a chronic issue since his surgery.   He has always had discomfort from the mediastinal incision  Starting approximately 2 months after the surgery. He reports that he was lifting something heavy and he hurt his mediastinum.  he was lifting a carpet over his head . He did have acute pain at the time in the area left of his mediastinum . Also hurt his chest a second time lifting a 4 wheeler by himself.  Today in office, he reports that he is not taking any of his medications Prior lab work showing total cholesterol 211, LDL 162, HDL 29, hemoglobin A1c 5.5  Previous x-ray of his sternum did not show wire fracture. CT scan was recommended  still smoking a small amount, using nicotine supplement  EKG today shows normal sinus rhythm with rate 72 beats per minute, T-wave abnormality 1 and aVL  Outpatient Encounter Prescriptions as of 02/03/2014  Medication  Sig  . aspirin 81 MG tablet Take 81 mg by mouth daily.  . nitroGLYCERIN (NITROSTAT) 0.4 MG SL tablet Place 1 tablet (0.4 mg total) under the tongue every 5 (five) minutes as needed.  Marland Kitchen. atorvastatin (LIPITOR) 20 MG tablet Take 1 tablet (20 mg total) by mouth daily.  . clopidogrel (PLAVIX) 75 MG tablet Take 1 tablet (75 mg total) by mouth daily.  Marland Kitchen. lisinopril (PRINIVIL,ZESTRIL) 2.5 MG tablet Take 1 tablet (2.5 mg total) by mouth daily.  . metoprolol tartrate (LOPRESSOR) 25 MG tablet Take 1 tablet (25 mg total) by mouth 2 (two) times daily.  Marland Kitchen. oxyCODONE (OXY IR/ROXICODONE) 5 MG immediate release tablet Takes 1-2 tablets every 6 hours as needed for pain.   he's not taking any of his medications at this time  Review of Systems  Constitutional: Negative.   HENT: Negative.   Eyes: Negative.   Respiratory: Negative.   Cardiovascular: Positive for chest pain.  Gastrointestinal: Negative.   Endocrine: Negative.   Musculoskeletal: Negative.   Skin: Negative.   Allergic/Immunologic: Negative.   Neurological: Negative.   Hematological: Negative.   Psychiatric/Behavioral: Negative.   All other systems reviewed and are negative.   BP 110/90  Pulse 72  Ht 6\' 2"  (1.88 m)  Wt 280 lb 12 oz (127.347 kg)  BMI 36.03 kg/m2  Physical Exam  Nursing note and vitals reviewed. Constitutional: He is oriented to person, place, and time. He appears well-developed and well-nourished.  HENT:  Head: Normocephalic.  Nose: Nose normal.  Mouth/Throat: Oropharynx is clear  and moist.  Eyes: Conjunctivae are normal. Pupils are equal, round, and reactive to light.  Neck: Normal range of motion. Neck supple. No JVD present.  Cardiovascular: Normal rate, regular rhythm, S1 normal, S2 normal, normal heart sounds and intact distal pulses.  Exam reveals no gallop and no friction rub.   No murmur heard. Well-healed mediastinal scar  Pulmonary/Chest: Effort normal and breath sounds normal. No respiratory distress. He  has no wheezes. He has no rales. He exhibits no tenderness.  Abdominal: Soft. Bowel sounds are normal. He exhibits no distension. There is no tenderness.  Musculoskeletal: Normal range of motion. He exhibits no edema and no tenderness.  Lymphadenopathy:    He has no cervical adenopathy.  Neurological: He is alert and oriented to person, place, and time. Coordination normal.  Skin: Skin is warm and dry. No rash noted. No erythema.  Psychiatric: He has a normal mood and affect. His behavior is normal. Judgment and thought content normal.      Assessment and Plan

## 2014-02-03 NOTE — Assessment & Plan Note (Signed)
We have recommended he restart his Lipitor daily. Recheck cholesterol at his convenience in several months time

## 2014-02-03 NOTE — Assessment & Plan Note (Signed)
We have suggested he restart his metoprolol 25 mg twice a day

## 2014-02-03 NOTE — Assessment & Plan Note (Signed)
No further workup. Symptoms are relatively stable. Chronic atypical chest pain

## 2014-02-03 NOTE — Patient Instructions (Addendum)
Your next appointment will be scheduled in our new office located at :  [facility]  [address], [address]  [place], [place] [address]  You are doing well. No medication changes were made.  Please call us if you have new issues that need to be addressed before your next appt.  Your physician wants you to follow-up in: 6 months.  You will receive a reminder letter in the mail two months in advance. If you don't receive a letter, please call our office to schedule the follow-up appointment.   

## 2014-02-03 NOTE — Assessment & Plan Note (Signed)
He continues to have chronic sternum pain. By his report, does not take pain medication

## 2014-08-08 NOTE — Consult Note (Signed)
PATIENT NAME:  Brandon Parks, Marston N MR#:  [id number] DATE OF BIRTH:  [birth year]  DATE OF CONSULTATION:  11/05/2011  REFERRING PHYSICIAN:  Dr. Luberta MutterKonidena  CONSULTING PHYSICIAN:  Marcina MillardAlexander Emilyann Banka, MD  CHIEF COMPLAINT: Chest pain.   REASON FOR CONSULTATION: Consultation requested by Dr. Luberta MutterKonidena for evaluation of chest pain and borderline elevated troponin.   HISTORY OF PRESENT ILLNESS: The patient is a 34 year old gentleman referred for evaluation of chest pain and elevated troponin. The patient recently has been experiencing bilateral shoulder tendinitis and has been on prednisone. Two days ago he started to note midsternal burning sensation attributed to heartburn. The [facility] today where his symptoms have progressed. In the Emergency Room EKG was unremarkable. The patient was treated initially with GI cocktail, Valium, and Dilaudid without relief. Troponin was 0.28. The patient is now admitted to the CCU for possible acute coronary syndrome. The patient reports the discomfort to be a burning sensation perhaps with radiation down both arms to his elbows. The patient has had no prior history of chest pain.   PAST MEDICAL HISTORY: Bilateral shoulder tendinitis.  MEDICATIONS: Prednisone taper.   SOCIAL HISTORY: The patient is part of an apartment maintenance crew. He previously was smoking a pack of cigarettes a day. Right now he smokes a little less than a pack a day. The patient is married and has one stepson.   FAMILY HISTORY: No immediate family history for coronary artery disease.   REVIEW OF SYSTEMS: CONSTITUTIONAL: No fever or chills. EYES: No blurry vision. EARS: No hearing loss. RESPIRATORY: No shortness of breath. CARDIOVASCULAR: Midsternal chest discomfort described as burning as described above. GI: No nausea, vomiting, diarrhea, or constipation. GU: No dysuria or hematuria. ENDOCRINE: No polyuria or polydipsia. INTEGUMENTARY: No rashes. MUSCULOSKELETAL:  Bilateral shoulder arthritis as described above. NEUROLOGICAL: No focal muscle weakness or numbness. PSYCHOLOGICAL: No anxiety or depression.   PHYSICAL EXAMINATION:   GENERAL: The patient appears anxious and diaphoretic and in moderate discomfort.   HEENT: Pupils equal and reactive to light and accommodation.   NECK: Supple without thyromegaly.   LUNGS: Clear.   CARDIOVASCULAR: Normal JVP. Normal PMI. Regular rate and rhythm. Normal S1, S2. No appreciable gallop, murmur, or rub.   ABDOMEN: Soft, nontender. Pulses were equal and bilateral.   MUSCULOSKELETAL: Normal muscle tone.   NEUROLOGIC: The patient is alert and oriented x3. Motor and sensory both grossly intact.   ACCESSORY DATA: Chest CT was performed which reportedly showed normal thoracic aorta without evidence for dissection. No evidence for pulmonary embolus.   IMPRESSION: This is a 34 year old gentleman who presents with substernal chest discomfort described as a burning sensation with unremarkable EKG. Initial troponin is borderline elevated. The patient has recently been on prednisone for bilateral shoulder tendinitis.    RECOMMENDATIONS:  1. Agree with overall current therapy.  2. Would agree with heparin bolus and drip.  3. Agree with nitroglycerin drip if tolerated. 4. Add low dose metoprolol       5. Further recommendations including cardiac catheterization pending patient's initial clinical course. If the patient develops ECG changes, then would proceed directly to cardiac catheterization.  ____________________________ Marcina MillardAlexander Legacy Lacivita, MD ap:drc D: 11/05/2011 16:37:22 ET T: 11/05/2011 17:18:36 ET JOB#: [id number]  cc: Marcina MillardAlexander Shaneece Stockburger, MD, <Dictator> Marcina MillardALEXANDER Jaqualyn Juday MD ELECTRONICALLY SIGNED 11/18/2011 8:26

## 2014-08-08 NOTE — Discharge Summary (Signed)
PATIENT NAME:  Brandon Parks, Brandon Parks MR#:  [id number] DATE OF BIRTH:  [birth year]  DATE OF ADMISSION:  11/05/2011 DATE OF DISCHARGE:  11/05/2011  DISPOSITION: To [facility] for emergency bypass surgery.   REASON FOR TRANSFER: The patient has occluded 95% LAD and has a thrombus in LAD. The patient is going for emergency bypass.   HOSPITAL COURSE: The patient is a 34 year old male with no past medical history except history of tendinitis in shoulders, on steroids for the last five days, came in with chest pain and diaphoresis. The patient complained of chest pain in the middle on the left side, burning sensation, did not get better with GI cocktail and also Dilaudid and sublingual nitroglycerin. EKG not significant initially for first two EKGs. Troponins up to 0.28. Because of symptoms with diaphoresis, the patient was admitted to the Intensive Care Unit for possible acute coronary syndrome, started on heparin drip and nitro drip. Because of persistent chest pain, he had an EKG which showed ST elevation myocardial infarction, taken to emergency cardiac catheterization which showed complex ostial proximal LAD stenosis with extensive thrombus extending back to the left main. The patient is right now [facility]. The patient is started on Eptifibatide dip and also heparin drip and nitro drip, IV fluids with normal saline. The patient did not get beta blockers because of bradycardia, heart rate was in 50s. The patient's CT angiogram of the chest did not show any PE. Creatinine is normal with BUN 13, creatinine 1.02. The patient's chest x-ray did not show any acute changes. CBC shows slight elevation of WBC 14.8, thought to be secondary to recent prednisone use. The [facility].   TIME SPENT ON DISCHARGE PREPARATION: Less than 30 minutes.   ____________________________ Katha HammingSnehalatha Lexee Brashears, MD sk:ap D: 11/05/2011 18:56:42 ET T: 11/06/2011 13:29:53  ET JOB#: [id number] cc: Katha HammingSnehalatha Gresham Caetano, MD, <Dictator> Katha HammingSNEHALATHA Lexis Potenza MD ELECTRONICALLY SIGNED 11/14/2011 21:30

## 2014-08-08 NOTE — H&P (Signed)
PATIENT NAME:  Brandon Parks, Brandon Parks MR#:  [id number] DATE OF BIRTH:  1980-06-29  DATE OF ADMISSION:  11/05/2011  PRIMARY DOCTOR: None.  ER PHYSICIAN: Dr. Olivia MackieGina Martin    CHIEF COMPLAINT: Chest pain.   HISTORY OF PRESENT ILLNESS: The patient is a 34 year old male with history of recent tendinitis on prednisone for the last five days who came in because of chest pain on the left side. He mainly complains of burning in the chest. No radiation of pain to the left side or back. The patient is very diaphoretic and having chest pain. In the ER he received a GI cocktail along with Valium and Dilaudid 1 mg IV, and sublingual nitro. Chest pain did not get better and he continues to have chest pain. Repeat EKG did not show much changes but troponin up to 0.28. I spoke with Dr. Gwen PoundsKowalski who recommended to start him on heparin and nitro drips and admit to CCU for possible acute coronary syndrome. He is having still chest pain on the left side and no other complaints. No trouble breathing. No nausea or vomiting.   PAST MEDICAL HISTORY: Tendinitis of both shoulders. Steroids were started five days ago. No other medical problems.   ALLERGIES: No known allergies.   SOCIAL HISTORY: Right now he is on smoking cessation program using nicotine patches and electronic cigars. No alcohol. No drugs. 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   PAST SURGICAL HISTORY: Significant for appendectomy.   FAMILY HISTORY: No family history of premature coronary artery disease.   MEDICATIONS: On prednisone right now. He is on 30 mg of prednisone now, was started at 45 mg and then going down to 5 mg until next Monday.   REVIEW OF SYSTEMS: CONSTITUTIONAL: Has no fever. EYES: No blurred vision. ENT: No tinnitus. No ear pain. No epistaxis. No difficulty swallowing. RESPIRATORY: No trouble breathing. No cough. CARDIOVASCULAR: Chest pain present on the left side with diaphoresis. Has no pedal edema. No dyspnea on exertion. GI: Complains of  heartburn and epigastric pain. No abdominal pain. No constipation. No diarrhea. GU: No dysuria. ENDOCRINE: No polyuria or nocturia. INTEGUMENTARY: No skin rashes. MUSCULOSKELETAL: Has tendinitis in both shoulders. NEUROLOGIC: No numbness or weakness. PSYCH: No anxiety or insomnia.   PHYSICAL EXAMINATION:   VITAL SIGNS: Temperature 96.4, pulse 63, respirations 22, blood pressure 165/87, repeat blood pressure 138/85, pulse 60. The patient's sats are 90% on room air.  GENERAL: The patient is awake, alert, and oriented in moderate distress because of chest pain and sweating profusely.   HEENT: Head atraumatic, normocephalic. Pupils equally reacting to light. Extraocular movements are intact.   ENT: The patient has no tympanic membrane congestion. No turbinate hypertrophy. No oropharyngeal erythema.   NECK: Normal range of motion. No JVD. No carotid bruit.   RESPIRATORY: Clear to auscultation. No wheeze. No rales.   CARDIOVASCULAR: S1, S2 regular. No murmurs. PMI not displaced.   ABDOMEN: Soft, nontender, nondistended. Bowel sounds present.   MUSCULOSKELETAL: Strength 5/5 in upper and lower extremities.   SKIN: No skin rashes.   NEUROLOGIC: No cranial nerve deficit. No sensory deficit. No dysarthria.   LABORATORY, DIAGNOSTIC, AND RADIOLOGICAL DATA: CT angiography of the chest showed no significant abnormality. No aortic dissection. Thoracic aorta unremarkable.   WBC 14.8, hemoglobin 16.1, hematocrit 48.5, platelets 201. Electrolytes sodium 141, potassium 3.9, chloride 104, bicarb 26, BUN 13, creatinine 1.02, glucose 101. Troponin 0.28. CK total 171. CPK-MB 2.1.   Chest x-ray no acute disease of the chest.   EKG showed  sinus brady, 57 beats per minute. No ST-T changes. EKG is repeated twice because of the patient's symptoms and both EKGs did not reveal any acute ST changes.   ASSESSMENT AND PLAN:  1. This is a 34 year old male with chest pain, diaphoresis, and elevated troponin with  symptoms concerning for ACS. The patient is going to be admitted to CCU. I spoke with Dr. Gwen Pounds who is on call for Cardiology who recommended heparin and nitro drip. Start him on both and use morphine for the pain control. Unable to give beta-blockers because of bradycardia. Continue the aspirin. Keep Parks.p.o. for possible cardiac cath.  2. Acute gastritis. The patient has been on high dose steroids and will start him on IV Protonix along with Mylanta and see how he responds.  3. Leukocytosis likely secondary to steroids. No evidence of fever. No evidence of pneumonia on x-ray. Will watch CBC at this time.   TIME SPENT: About 55 minutes.   ____________________________ Katha Hamming, MD sk:drc D: 11/05/2011 15:35:50 ET T: 11/05/2011 16:01:52 ET JOB#: [id number]  cc: Katha Hamming, MD, <Dictator> Katha Hamming MD ELECTRONICALLY SIGNED 11/24/2011 21:52

## 2015-01-19 ENCOUNTER — Ambulatory Visit (INDEPENDENT_AMBULATORY_CARE_PROVIDER_SITE_OTHER): Payer: 59 | Admitting: Cardiovascular Disease

## 2015-01-19 ENCOUNTER — Encounter: Payer: Self-pay | Admitting: Cardiovascular Disease

## 2015-01-19 VITALS — BP 120/78 | HR 68 | Ht 74.0 in | Wt 266.2 lb

## 2015-01-19 DIAGNOSIS — I739 Peripheral vascular disease, unspecified: Secondary | ICD-10-CM

## 2015-01-19 DIAGNOSIS — I10 Essential (primary) hypertension: Secondary | ICD-10-CM | POA: Diagnosis not present

## 2015-01-19 DIAGNOSIS — I25119 Atherosclerotic heart disease of native coronary artery with unspecified angina pectoris: Secondary | ICD-10-CM | POA: Diagnosis not present

## 2015-01-19 DIAGNOSIS — Z951 Presence of aortocoronary bypass graft: Secondary | ICD-10-CM | POA: Diagnosis not present

## 2015-01-19 DIAGNOSIS — E785 Hyperlipidemia, unspecified: Secondary | ICD-10-CM

## 2015-01-19 MED ORDER — ATORVASTATIN CALCIUM 40 MG PO TABS: 20.0000 mg | ORAL_TABLET | Freq: Every day | ORAL | Status: DC

## 2015-01-19 MED ORDER — METOPROLOL TARTRATE 25 MG PO TABS: 25.0000 mg | ORAL_TABLET | Freq: Two times a day (BID) | ORAL | Status: DC

## 2015-01-19 NOTE — Assessment & Plan Note (Signed)
Currently with no symptoms of angina Stressed importance of weight loss, education compliance, cholesterol control

## 2015-01-19 NOTE — Assessment & Plan Note (Signed)
Recommended he restart his metoprolol.

## 2015-01-19 NOTE — Progress Notes (Signed)
Patient ID: Brandon Parks, male    DOB: 1980-05-19, 34 y.o.   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 Balloon pump was placed and he was transferred to [facility] on [facility] and heparin infusion. He underwent bypass surgery with LIMA to the LAD, vein graft to the OM, vein graft to the diagonal.  He developed recurrent chest pain August  2013 and presented to [facility] and again was transferred to [facility] with repeat catheterization. He reports that something was "blocked up" but the details are not available to [place]. He  recovered from his surgery. He denies any significant chest burning. 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  In follow-up today, he is not taking any of his medications. He did not seem to think he needed any until he had recent lab work done through his work. He is smoking, using a vapor cigarette, several cigarettes per day. Review of lab work with him shows total cholesterol 208, LDL 157, elevated BMI of 33.  His biggest complaint is right leg pain when he exerts himself. This has been a chronic issue, getting worse over the past several months. Pain through the thigh down to his toes. No pain at rest.  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 He reports that he was lifting something heavy and he hurt his mediastinum.  he was lifting a carpet over his head . He did have acute pain at the time in the area left of his mediastinum . Also hurt his chest a second time lifting a four wheeler by  himself.  Previous x-ray of his sternum did not show wire fracture. CT scan was recommended   No Known Allergies  Current Outpatient Prescriptions on File Prior to Visit  Medication Sig Dispense Refill  . aspirin 81 MG tablet Take 81 mg by mouth daily.    . nitroGLYCERIN (NITROSTAT) 0.4 MG SL tablet Place 1 tablet (0.4 mg total) under the tongue every 5 (five) minutes as needed. (Patient not taking: Reported on 01/19/2015) 25 tablet 6   No current facility-administered medications on file prior to visit.   he's not taking any of his medications at this time including Lipitor, metoprolol, aspirin, Plavix   Past Medical History  Diagnosis Date  . Tendinitis     bilateral  . GERD (gastroesophageal reflux disease)   . Carotid artery occlusion   . NSTEMI (non-ST elevated myocardial infarction)   . CAD (coronary artery disease)   . Pneumothorax on left   . MI (myocardial infarction) 2013    Past Surgical History  Procedure Laterality Date  . Appendectomy    . Coronary artery bypass graft  2013    @ [facility]  . Cardiac catheterization  2013    @ [facility]; [facility]  . Coronary angioplasty  2013      . Cardiac catheterization  Aug 2013    @ [facility]    Social History  reports that he has been smoking Cigarettes.  He has a 7 pack-year smoking history. He does not have any smokeless tobacco history on file.  He reports that he drinks about 1.2 oz of alcohol per week. He reports that he uses illicit drugs (Marijuana).  Family History Family history is unknown by patient.   Review of Systems  Constitutional: Negative.   Respiratory: Negative.   Cardiovascular: Negative.   Gastrointestinal: Negative.   Musculoskeletal: Negative.        Right leg pain with walking  Neurological: Negative.   Hematological: Negative.   Psychiatric/Behavioral: Negative.   All other systems reviewed and are negative.   BP 120/78 mmHg  Pulse 68  Ht  (1.88 m)  Wt 266 lb 4 oz (120.77 kg)  BMI  34.17 kg/m2  Physical Exam  Constitutional: He is oriented to person, place, and time. He appears well-developed and well-nourished.  HENT:  Head: Normocephalic.  Nose: Nose normal.  Mouth/Throat: Oropharynx is clear and moist.  Eyes: Conjunctivae are normal. Pupils are equal, round, and reactive to light.  Neck: Normal range of motion. Neck supple. No JVD present.  Cardiovascular: Normal rate, regular rhythm, S1 normal, S2 normal, normal heart sounds and intact distal pulses.  Exam reveals no gallop and no friction rub.   No murmur heard. Pulses:      Dorsalis pedis pulses are 0 on the right side, and 2+ on the left side.       Posterior tibial pulses are 0 on the right side, and 2+ on the left side.  Well-healed mediastinal scar  Pulmonary/Chest: Effort normal and breath sounds normal. No respiratory distress. He has no wheezes. He has no rales. He exhibits no tenderness.  Abdominal: Soft. Bowel sounds are normal. He exhibits no distension. There is no tenderness.  Musculoskeletal: Normal range of motion. He exhibits no edema or tenderness.  Lymphadenopathy:    He has no cervical adenopathy.  Neurological: He is alert and oriented to person, place, and time. Coordination normal.  Skin: Skin is warm and dry. No rash noted. No erythema.  Psychiatric: He has a normal mood and affect. His behavior is normal. Judgment and thought content normal.      Assessment and Plan   Nursing note and vitals reviewed.

## 2015-01-19 NOTE — Assessment & Plan Note (Signed)
Currently with no symptoms of angina. No further workup at this time. We have recommended he restart his medications including aspirin, statin, beta blocker He does not want Plavix

## 2015-01-19 NOTE — Patient Instructions (Addendum)
  For leg pain:  We will order ABI/LE arterial ultrasound for claudication Ambulatory ABIs  Please restart your medications  Please call us if you have new issues that need to be addressed before your next appt.  Your physician wants you to follow-up in: 6 months.  You will receive a reminder letter in the mail two months in advance. If you don't receive a letter, please call our office to schedule the follow-up appointment.  Ankle-Brachial Index Test The Ankle-Brachial index is a test used to find peripheral vascular disease (PVD). PVD is also known as peripheral arterial disease (PAD). PVD is a blocking or hardening of the arteries anywhere within the circulatory system beyond the heart. The cause is cholesterol deposits within the blood vessels (atherosclerosis). These deposits cause arteries to narrow. The delivery of oxygen to tissues is impaired as a result. This can cause muscle pain and fatigue. This is called claudication.  PVD means there may also be build up of cholesterol in the:  Heart, increasing the risk for heart attacks.  Brain, increasing the risk for strokes. This test measures the blood flow in the arms and legs. The test also determines if blood vessels are clogged by cholesterol deposits.  HOW IS THE ANKLE-BRACHIAL INDEX TEST DONE? The test is done while you are lying down and resting. The arm (brachial) and ankle systolic pressure are measured. The measurements are taken two times on both sides. Systolic pressure is the pressure within the arteries when the heart pumps. The highest systolic pressure of the ankle is then divided by the highest arm systolic pressure. The result is the ankle-brachial pressure ratio or ABI. There should be a difference of less than 10 mm Hg. Sometimes this test is repeated after five minutes of exercising on a treadmill.  You may have leg pain during the treadmill portion of the test if you suffer from PAD. If the index number drops after  exercise, this may show that PAD is present. Document Released: 04/11/2004 Document Revised: 04/12/2013 Document Reviewed: 06/02/2007 [facility] Patient Information 2015 [place], [place]. This information is not intended to replace advice given to you by your health care provider. Make sure you discuss any questions you have with your health care provider.

## 2015-01-19 NOTE — Assessment & Plan Note (Signed)
We have encouraged continued exercise, careful diet management in an effort to lose weight. Recommended he start Lipitor 40 mg daily

## 2015-02-16 ENCOUNTER — Ambulatory Visit (INDEPENDENT_AMBULATORY_CARE_PROVIDER_SITE_OTHER): Payer: 59

## 2015-02-16 DIAGNOSIS — I10 Essential (primary) hypertension: Secondary | ICD-10-CM

## 2015-02-16 DIAGNOSIS — I25119 Atherosclerotic heart disease of native coronary artery with unspecified angina pectoris: Secondary | ICD-10-CM | POA: Diagnosis not present

## 2015-02-16 DIAGNOSIS — Z951 Presence of aortocoronary bypass graft: Secondary | ICD-10-CM

## 2015-02-16 DIAGNOSIS — I739 Peripheral vascular disease, unspecified: Secondary | ICD-10-CM

## 2015-02-26 ENCOUNTER — Ambulatory Visit (INDEPENDENT_AMBULATORY_CARE_PROVIDER_SITE_OTHER): Payer: 59 | Admitting: Cardiovascular Disease

## 2015-02-26 ENCOUNTER — Encounter: Payer: Self-pay | Admitting: Cardiovascular Disease

## 2015-02-26 VITALS — BP 118/78 | HR 74 | Ht 74.0 in | Wt 267.0 lb

## 2015-02-26 DIAGNOSIS — Z01812 Encounter for preprocedural laboratory examination: Secondary | ICD-10-CM | POA: Diagnosis not present

## 2015-02-26 DIAGNOSIS — I739 Peripheral vascular disease, unspecified: Secondary | ICD-10-CM | POA: Diagnosis not present

## 2015-02-26 DIAGNOSIS — R079 Chest pain, unspecified: Secondary | ICD-10-CM | POA: Diagnosis not present

## 2015-02-26 DIAGNOSIS — Z720 Tobacco use: Secondary | ICD-10-CM | POA: Insufficient documentation

## 2015-02-26 NOTE — Patient Instructions (Addendum)
Medication Instructions:  Your physician recommends that you continue on your current medications as directed. Please refer to the Current Medication list given to you today.   Labwork: BMET, CBC, PT/INR  Testing/Procedures: Your physician has requested you have an abdominal aortogram. Wednesday, November 9. Arrival time: 6:30am NOTHING TO EAT OR DRINK AFTER MIDNIGHT THE EVENING BEFORE YOUR PROCEDURE. You may take you medications w/a sip of water.  [facility] [address] [place]  [phone]  Peripheral Vascular Disease Peripheral vascular disease (PVD) is a disease of the blood vessels that are not part of your heart and brain. A simple term for PVD is poor circulation. In most cases, PVD narrows the blood vessels that carry blood from your heart to the rest of your body. This can result in a decreased supply of blood to your arms, legs, and internal organs, like your stomach or kidneys. However, it most often affects a person's lower legs and feet. There are two types of PVD.  Organic PVD. This is the more common type. It is caused by damage to the structure of blood vessels.  Functional PVD. This is caused by conditions that make blood vessels contract and tighten (spasm). Without treatment, PVD tends to get worse over time. PVD can also lead to acute ischemic limb. This is when an arm or limb suddenly has trouble getting enough blood. This is a medical emergency. CAUSES Each type of PVD has many different causes. The most common cause of PVD is buildup of a fatty material (plaque) inside of your arteries (atherosclerosis). Small amounts of plaque can break off from the walls of the blood vessels and become lodged in a smaller artery. This blocks blood flow and can cause acute ischemic limb. Other common causes of PVD include:  Blood clots that form inside of blood vessels.  Injuries to blood vessels.  Diseases that cause inflammation of blood  vessels or cause blood vessel spasms.  Health behaviors and health history that increase your risk of developing PVD. RISK FACTORS  You may have a greater risk of PVD if you:  Have a family history of PVD.  Have certain medical conditions, including:  High cholesterol.  Diabetes.  High blood pressure (hypertension).  Coronary heart disease.  Past problems with blood clots.  Past injury, such as burns or a broken bone. These may have damaged blood vessels in your limbs.  Buerger disease. This is caused by inflamed blood vessels in your hands and feet.  Some forms of arthritis.  Rare birth defects that affect the arteries in your legs.  Use tobacco.  Do not get enough exercise.  Are obese.  Are age 34 or older. SIGNS AND SYMPTOMS  PVD may cause many different symptoms. Your symptoms depend on what part of your body is not getting enough blood. Some common signs and symptoms include:  Cramps in your lower legs. This may be a symptom of poor leg circulation (claudication).  Pain and weakness in your legs while you are physically active that goes away when you rest (intermittent claudication).  Leg pain when at rest.  Leg numbness, tingling, or weakness.  Coldness in a leg or foot, especially when compared with the other leg.  Skin or hair changes. These can include:  Hair loss.  Shiny skin.  Pale or bluish skin.  Thick toenails.  Inability to get or maintain an erection (erectile dysfunction). People with PVD are more prone to developing ulcers and sores on their  toes, feet, or legs. These may take longer than normal to heal. DIAGNOSIS Your health care provider may diagnose PVD from your signs and symptoms. The health care provider will also do a physical exam. You may have tests to find out what is causing your PVD and determine its severity. Tests may include:  Blood pressure recordings from your arms and legs and measurements of the strength of your  pulses (pulse volume recordings).  Imaging studies using sound waves to take pictures of the blood flow through your blood vessels (Doppler ultrasound).  Injecting a dye into your blood vessels before having imaging studies using:  X-rays (angiogram or arteriogram).  Computer-generated X-rays (CT angiogram).  A powerful electromagnetic field and a computer (magnetic resonance angiogram or MRA). TREATMENT Treatment for PVD depends on the cause of your condition and the severity of your symptoms. It also depends on your age. Underlying causes need to be treated and controlled. These include long-lasting (chronic) conditions, such as diabetes, high cholesterol, and high blood pressure. You may need to first try making lifestyle changes and taking medicines. Surgery may be needed if these do not work. Lifestyle changes may include:  Quitting smoking.  Exercising regularly.  Following a low-fat, low-cholesterol diet. Medicines may include:  Blood thinners to prevent blood clots.  Medicines to improve blood flow.  Medicines to improve your blood cholesterol levels. Surgical procedures may include:  A procedure that uses an inflated balloon to open a blocked artery and improve blood flow (angioplasty).  A procedure to put in a tube (stent) to keep a blocked artery open (stent implant).  Surgery to reroute blood flow around a blocked artery (peripheral bypass surgery).  Surgery to remove dead tissue from an infected wound on the affected limb.  Amputation. This is surgical removal of the affected limb. This may be necessary in cases of acute ischemic limb that are not improved through medical or surgical treatments. HOME CARE INSTRUCTIONS  Take medicines only as directed by your health care provider.  Do not use any tobacco products, including cigarettes, chewing tobacco, or electronic cigarettes. If you need help quitting, ask your health care provider.  Lose weight if you are  overweight, and maintain a healthy weight as directed by your health care provider.  Eat a diet that is low in fat and cholesterol. If you need help, ask your health care provider.  Exercise regularly. Ask your health care provider to suggest some good activities for you.  Use compression stockings or other mechanical devices as directed by your health care provider.  Take good care of your feet.  Wear comfortable shoes that fit well.  Check your feet often for any cuts or sores. SEEK MEDICAL CARE IF:  You have cramps in your legs while walking.  You have leg pain when you are at rest.  You have coldness in a leg or foot.  Your skin changes.  You have erectile dysfunction.  You have cuts or sores on your feet that are not healing. SEEK IMMEDIATE MEDICAL CARE IF:  Your arm or leg turns cold and blue.  Your arms or legs become red, warm, swollen, painful, or numb.  You have chest pain or trouble breathing.  You suddenly have weakness in your face, arm, or leg.  You become very confused or lose the ability to speak.  You suddenly have a very bad headache or lose your vision.   This information is not intended to replace advice given to you by your  health care provider. Make sure you discuss any questions you have with your health care provider.   Document Released: 05/15/2004 Document Revised: 04/28/2014 Document Reviewed: 09/15/2013 Elsevier Interactive Patient Education 2016 [employer].    Follow-Up: Your physician recommends that you schedule a follow-up appointment in: three weeks with Dr. Kirke Corin.    Any Other Special Instructions Will Be Listed Below (If Applicable).     If you need a refill on your cardiac medications before your next appointment, please call your pharmacy.

## 2015-02-26 NOTE — Assessment & Plan Note (Signed)
No convincing evidence of anginal chest pain.

## 2015-02-26 NOTE — Assessment & Plan Note (Signed)
Continue treatment with atorvastatin with a target LDL of less than 70. 

## 2015-02-26 NOTE — Progress Notes (Signed)
Primary cardiologist: Dr. Gollan  HPI  Brandon Parks is a 34-year-old gentleman who was referred for evaluation of claudication and peripheral arterial disease. He has known history of coronary artery disease status post CABG in July 2013 for left main disease/dissection, tobacco use and hyperlipidemia.   He developed recurrent chest pain August  2013 and presented to [facility] and again was transferred to [facility] with repeat catheterization. He reports that something was "blocked up" but the details are not available to us. He  recovered from his surgery.  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  Over the last 4 months, he has experienced severe right leg pain and numbness with minimal walking. He usually is able to walk 40-50 feet before he gets the pain in his leg. This also happens with going up 5 steps. There is no rest pain or lower extremity ulceration. He underwent noninvasive evaluation which showed normal ABI bilaterally. 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   No Known Allergies   Current Outpatient Prescriptions on File Prior to Visit  Medication Sig Dispense Refill  . aspirin 81 MG tablet Take 81 mg by mouth daily.    . atorvastatin (LIPITOR) 40 MG tablet Take 0.5 tablets (20 mg total) by mouth daily. 90 tablet 3  . metoprolol tartrate (LOPRESSOR) 25 MG tablet Take 1 tablet (25 mg total) by mouth 2 (two) times daily. 180 tablet 3  . nitroGLYCERIN (NITROSTAT) 0.4 MG SL tablet Place 1 tablet (0.4 mg total) under the tongue every 5 (five) minutes as needed. 25 tablet 6   No current facility-administered medications on file prior to visit.     Past Medical History  Diagnosis Date  . Tendinitis     bilateral  . GERD (gastroesophageal reflux disease)   . Carotid artery occlusion   . NSTEMI  (non-ST elevated myocardial infarction) (HCC)   . CAD (coronary artery disease)   . Pneumothorax on left   . MI (myocardial infarction) (HCC) 2013     Past Surgical History  Procedure Laterality Date  . Appendectomy    . Coronary artery bypass graft  2013    @ [facility]  . Cardiac catheterization  2013    @ [facility]; [facility]  . Coronary angioplasty  2013    @[facility]  . Cardiac catheterization  Aug 2013    @ [facility]     Family History  Problem Relation Age of Onset  . Family history unknown: Yes     Social History   Social History  . Marital Status: Married    Spouse Name: N/A  . Number of Children: N/A  . Years of Education: N/A   Occupational History  . Not on file.   Social History Main Topics  . Smoking status: Current Every Day Smoker -- 1.00 packs/day for 7 years    Types: Cigarettes    Last Attempt to Quit: 11/09/2011  . Smokeless tobacco: Not on file  . Alcohol Use: 1.2 oz/week    2 Cans of beer per week     Comment: ocassionally  . Drug Use: Yes    Special: Marijuana     Comment: past  . Sexual Activity: Not on file   Other Topics Concern  . Not on file   Social History Narrative     ROS A 10 point review   of system was performed. It is negative other than that mentioned in the history of present illness.   PHYSICAL EXAM   BP 118/78 mmHg  Pulse 74  Ht 6\' 2"  (1.88 m)  Wt 267 lb (121.11 kg)  BMI 34.27 kg/m2 Constitutional: He is oriented to person, place, and time. He appears well-developed and well-nourished. No distress.  HENT: No nasal discharge.  Head: Normocephalic and atraumatic.  Eyes: Pupils are equal and round.  No discharge. Neck: Normal range of motion. Neck supple. No JVD present. No thyromegaly present.  Cardiovascular: Normal rate, regular rhythm, normal heart sounds. Exam reveals no gallop and no friction rub. No murmur heard.  Pulmonary/Chest: Effort normal and breath sounds normal. No stridor. No respiratory distress. He has no  wheezes. He has no rales. He exhibits no tenderness.  Abdominal: Soft. Bowel sounds are normal. He exhibits no distension. There is no tenderness. There is no rebound and no guarding.  Musculoskeletal: Normal range of motion. He exhibits no edema and no tenderness.  Neurological: He is alert and oriented to person, place, and time. Coordination normal.  Skin: Skin is warm and dry. No rash noted. He is not diaphoretic. No erythema. No pallor.  Psychiatric: He has a normal mood and affect. His behavior is normal. Judgment and thought content normal.  Vascular: Radial pulses: +1 on the right side and +2 on the left side. Femoral pulses: +1 on the right side and +2 on the left side. Distal pulses are palpable bilaterally.     EKG: Normal sinus rhythm with no significant ST or T wave changes.   ASSESSMENT AND PLAN

## 2015-02-26 NOTE — Assessment & Plan Note (Signed)
I had a prolonged discussion with him about the importance of smoking cessation. I explained to him the strong association between nicotine use and peripheral arterial disease.

## 2015-02-26 NOTE — Assessment & Plan Note (Signed)
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 His symptoms are clearly lifestyle limiting and has significantly affected his ability to perform activities of daily living. Due to that, I recommend proceeding with abdominal aortogram, lower extremity runoff and possible endovascular intervention. I discussed the risks and benefits with him. Plan access via the right femoral artery.

## 2015-02-27 LAB — CBC
Hematocrit: 45.7 % (ref 37.5–51.0)
Hemoglobin: 15.9 g/dL (ref 12.6–17.7)
MCH: 30.9 pg (ref 26.6–33.0)
MCHC: 34.8 g/dL (ref 31.5–35.7)
MCV: 89 fL (ref 79–97)
PLATELETS: 220 10*3/uL (ref 150–379)
RBC: 5.14 x10E6/uL (ref 4.14–5.80)
RDW: 12.2 % — AB (ref 12.3–15.4)
WBC: 4.9 10*3/uL (ref 3.4–10.8)

## 2015-02-27 LAB — BASIC METABOLIC PANEL
BUN / CREAT RATIO: 12 (ref 8–19)
BUN: 12 mg/dL (ref 6–20)
CHLORIDE: 103 mmol/L (ref 97–106)
CO2: 22 mmol/L (ref 18–29)
Calcium: 9.1 mg/dL (ref 8.7–10.2)
Creatinine, Ser: 1.01 mg/dL (ref 0.76–1.27)
GFR calc non Af Amer: 97 mL/min/{1.73_m2} (ref >59)
GFR, EST AFRICAN AMERICAN: 112 mL/min/{1.73_m2} (ref >59)
GLUCOSE: 87 mg/dL (ref 65–99)
POTASSIUM: 4.5 mmol/L (ref 3.5–5.2)
Sodium: 142 mmol/L (ref 136–144)

## 2015-02-27 LAB — PROTIME-INR
INR: 1 (ref 0.8–1.2)
PROTHROMBIN TIME: 10.8 s (ref 9.1–12.0)

## 2015-02-28 ENCOUNTER — Encounter (HOSPITAL_COMMUNITY)
Admission: RE | Disposition: A | Discharge status: Home / Self Care | Payer: 59 | Source: Ambulatory Visit | Attending: Cardiovascular Disease

## 2015-02-28 ENCOUNTER — Other Ambulatory Visit: Payer: Self-pay | Admitting: Cardiovascular Disease

## 2015-02-28 ENCOUNTER — Ambulatory Visit (HOSPITAL_COMMUNITY)
Admission: RE | Admit: 2015-02-28 | Discharge: 2015-02-28 | Disposition: A | Discharge status: Home / Self Care | Payer: 59 | Source: Ambulatory Visit | Attending: Cardiovascular Disease | Admitting: Cardiovascular Disease

## 2015-02-28 DIAGNOSIS — I70211 Atherosclerosis of native arteries of extremities with intermittent claudication, right leg: Secondary | ICD-10-CM | POA: Diagnosis not present

## 2015-02-28 DIAGNOSIS — I2542 Coronary artery dissection: Secondary | ICD-10-CM | POA: Insufficient documentation

## 2015-02-28 DIAGNOSIS — I2510 Atherosclerotic heart disease of native coronary artery without angina pectoris: Secondary | ICD-10-CM | POA: Insufficient documentation

## 2015-02-28 DIAGNOSIS — I252 Old myocardial infarction: Secondary | ICD-10-CM | POA: Insufficient documentation

## 2015-02-28 DIAGNOSIS — E785 Hyperlipidemia, unspecified: Secondary | ICD-10-CM | POA: Insufficient documentation

## 2015-02-28 DIAGNOSIS — F17210 Nicotine dependence, cigarettes, uncomplicated: Secondary | ICD-10-CM | POA: Insufficient documentation

## 2015-02-28 DIAGNOSIS — K219 Gastro-esophageal reflux disease without esophagitis: Secondary | ICD-10-CM | POA: Insufficient documentation

## 2015-02-28 DIAGNOSIS — I6529 Occlusion and stenosis of unspecified carotid artery: Secondary | ICD-10-CM | POA: Diagnosis not present

## 2015-02-28 DIAGNOSIS — I739 Peripheral vascular disease, unspecified: Secondary | ICD-10-CM | POA: Insufficient documentation

## 2015-02-28 DIAGNOSIS — Z951 Presence of aortocoronary bypass graft: Secondary | ICD-10-CM | POA: Insufficient documentation

## 2015-02-28 HISTORY — PX: PERIPHERAL VASCULAR CATHETERIZATION: SHX172C

## 2015-02-28 LAB — POCT ACTIVATED CLOTTING TIME
Activated Clotting Time: 128 seconds
Activated Clotting Time: 128 seconds

## 2015-02-28 SURGERY — ABDOMINAL AORTOGRAM

## 2015-02-28 MED ORDER — LIDOCAINE HCL (PF) 1 % IJ SOLN
INTRAMUSCULAR | Status: AC
  Filled 2015-02-28: qty 30

## 2015-02-28 MED ORDER — SODIUM CHLORIDE 0.9 % IJ SOLN: 3.0000 mL | INTRAMUSCULAR | Status: DC | PRN

## 2015-02-28 MED ORDER — FENTANYL CITRATE (PF) 100 MCG/2ML IJ SOLN
INTRAMUSCULAR | Status: DC | PRN
  Administered 2015-02-28: 50 ug via INTRAVENOUS
  Administered 2015-02-28: 25 ug via INTRAVENOUS

## 2015-02-28 MED ORDER — CLOPIDOGREL BISULFATE 300 MG PO TABS
ORAL_TABLET | ORAL | Status: AC
  Filled 2015-02-28: qty 2

## 2015-02-28 MED ORDER — FAMOTIDINE IN NACL 20-0.9 MG/50ML-% IV SOLN
INTRAVENOUS | Status: DC | PRN
  Administered 2015-02-28: 20 mg via INTRAVENOUS

## 2015-02-28 MED ORDER — CLOPIDOGREL BISULFATE 75 MG PO TABS: 75.0000 mg | ORAL_TABLET | Freq: Every day | ORAL | Status: DC

## 2015-02-28 MED ORDER — ASPIRIN 81 MG PO CHEW
81.0000 mg | CHEWABLE_TABLET | ORAL | Status: AC
  Administered 2015-02-28: 81 mg via ORAL

## 2015-02-28 MED ORDER — MIDAZOLAM HCL 2 MG/2ML IJ SOLN
INTRAMUSCULAR | Status: AC
  Filled 2015-02-28: qty 4

## 2015-02-28 MED ORDER — SODIUM CHLORIDE 0.9 % IV SOLN: 250.0000 mL | INTRAVENOUS | Status: DC | PRN

## 2015-02-28 MED ORDER — HEPARIN (PORCINE) IN NACL 2-0.9 UNIT/ML-% IJ SOLN
INTRAMUSCULAR | Status: AC
  Filled 2015-02-28: qty 1000

## 2015-02-28 MED ORDER — HEPARIN SODIUM (PORCINE) 1000 UNIT/ML IJ SOLN
INTRAMUSCULAR | Status: AC
  Filled 2015-02-28: qty 1

## 2015-02-28 MED ORDER — LIDOCAINE HCL (PF) 1 % IJ SOLN
INTRAMUSCULAR | Status: DC | PRN
  Administered 2015-02-28: 15 mL

## 2015-02-28 MED ORDER — MIDAZOLAM HCL 2 MG/2ML IJ SOLN
INTRAMUSCULAR | Status: DC | PRN
  Administered 2015-02-28: 1 mg via INTRAVENOUS

## 2015-02-28 MED ORDER — SODIUM CHLORIDE 0.9 % IJ SOLN: 3.0000 mL | Freq: Two times a day (BID) | INTRAMUSCULAR | Status: DC

## 2015-02-28 MED ORDER — FAMOTIDINE IN NACL 20-0.9 MG/50ML-% IV SOLN
INTRAVENOUS | Status: AC
  Filled 2015-02-28: qty 50

## 2015-02-28 MED ORDER — FENTANYL CITRATE (PF) 100 MCG/2ML IJ SOLN
INTRAMUSCULAR | Status: AC
  Filled 2015-02-28: qty 4

## 2015-02-28 MED ORDER — SODIUM CHLORIDE 0.9 % IV SOLN
INTRAVENOUS | Status: DC
  Administered 2015-02-28: 1000 mL via INTRAVENOUS

## 2015-02-28 MED ORDER — CLOPIDOGREL BISULFATE 75 MG PO TABS
ORAL_TABLET | ORAL | Status: DC | PRN
  Administered 2015-02-28: 600 mg via ORAL

## 2015-02-28 MED ORDER — IODIXANOL 320 MG/ML IV SOLN
INTRAVENOUS | Status: DC | PRN
  Administered 2015-02-28: 130 mL via INTRAVENOUS

## 2015-02-28 MED ORDER — ASPIRIN 81 MG PO CHEW
CHEWABLE_TABLET | ORAL | Status: AC
  Filled 2015-02-28: qty 1

## 2015-02-28 MED ORDER — SODIUM CHLORIDE 0.9 % IV SOLN: INTRAVENOUS | Status: AC

## 2015-02-28 MED ORDER — HEPARIN SODIUM (PORCINE) 1000 UNIT/ML IJ SOLN
INTRAMUSCULAR | Status: DC | PRN
  Administered 2015-02-28: 5000 [IU] via INTRAVENOUS

## 2015-02-28 SURGICAL SUPPLY — 18 items
BALLN MUSTANG 10X20X75 (BALLOONS) ×3
BALLN MUSTANG 9X20X75 (BALLOONS) ×3
BALLOON MUSTANG 10X20X75 (BALLOONS) ×1 IMPLANT
BALLOON MUSTANG 9X20X75 (BALLOONS) ×1 IMPLANT
CATH ANGIO 5F PIGTAIL 65CM (CATHETERS) ×3 IMPLANT
KIT ENCORE 26 ADVANTAGE (KITS) ×3 IMPLANT
KIT MICROINTRODUCER STIFF 5F (SHEATH) ×3 IMPLANT
KIT PV (KITS) ×3 IMPLANT
SHEATH BRITE TIP 6FR 35CM (SHEATH) ×3 IMPLANT
SHEATH PINNACLE 5F 10CM (SHEATH) ×3 IMPLANT
STENT EXPRESS LD 8X17X75 (Permanent Stent) ×3 IMPLANT
STOPCOCK MORSE 400PSI 3WAY (MISCELLANEOUS) ×3 IMPLANT
SYR MEDRAD MARK V 150ML (SYRINGE) ×3 IMPLANT
SYRINGE MEDRAD AVANTA MACH 7 (SYRINGE) ×3 IMPLANT
TAPE RADIOPAQUE TURBO (MISCELLANEOUS) ×3 IMPLANT
TRANSDUCER W/STOPCOCK (MISCELLANEOUS) ×3 IMPLANT
TRAY PV CATH (CUSTOM PROCEDURE TRAY) ×3 IMPLANT
WIRE HITORQ VERSACORE ST 145CM (WIRE) ×3 IMPLANT

## 2015-02-28 NOTE — H&P (View-Only) (Signed)
Primary cardiologist: Dr. Mariah Milling  HPI  Brandon Parks is a 34 year old gentleman who was referred for evaluation of claudication and peripheral arterial disease. He has known history of coronary artery disease status post CABG in July 2013 for left main disease/dissection, tobacco use and hyperlipidemia.   He developed recurrent chest pain August  2013 and presented to [facility] and again was transferred to [facility] with repeat catheterization. He reports that something was "blocked up" but the details are not available to [place]. He  recovered from his surgery.  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  Over the last 4 months, he has experienced severe right leg pain and numbness with minimal walking. He usually is able to walk 40-50 feet before he gets the pain in his leg. This also happens with going up 5 steps. There is no rest pain or lower extremity ulceration. He underwent noninvasive evaluation which showed normal ABI bilaterally. 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   No Known Allergies   Current Outpatient Prescriptions on File Prior to Visit  Medication Sig Dispense Refill  . aspirin 81 MG tablet Take 81 mg by mouth daily.    Marland Kitchen atorvastatin (LIPITOR) 40 MG tablet Take 0.5 tablets (20 mg total) by mouth daily. 90 tablet 3  . metoprolol tartrate (LOPRESSOR) 25 MG tablet Take 1 tablet (25 mg total) by mouth 2 (two) times daily. 180 tablet 3  . nitroGLYCERIN (NITROSTAT) 0.4 MG SL tablet Place 1 tablet (0.4 mg total) under the tongue every 5 (five) minutes as needed. 25 tablet 6   No current facility-administered medications on file prior to visit.     Past Medical History  Diagnosis Date  . Tendinitis     bilateral  . GERD (gastroesophageal reflux disease)   . Carotid artery occlusion   . NSTEMI  (non-ST elevated myocardial infarction) (HCC)   . CAD (coronary artery disease)   . Pneumothorax on left   . MI (myocardial infarction) (HCC) 2013     Past Surgical History  Procedure Laterality Date  . Appendectomy    . Coronary artery bypass graft  2013    @ [facility]  . Cardiac catheterization  2013    @ [facility]; [facility]  . Coronary angioplasty  2013      . Cardiac catheterization  Aug 2013    @ [facility]     Family History  Problem Relation Age of Onset  . Family history unknown: Yes     Social History   Social History  . Marital Status: Married    Spouse Name: N/A  . Number of Children: N/A  . Years of Education: N/A   Occupational History  . Not on file.   Social History Main Topics  . Smoking status: Current Every Day Smoker -- 1.00 packs/day for 7 years    Types: Cigarettes    Last Attempt to Quit: 11/09/2011  . Smokeless tobacco: Not on file  . Alcohol Use: 1.2 oz/week    2 Cans of beer per week     Comment: ocassionally  . Drug Use: Yes    Special: Marijuana     Comment: past  . Sexual Activity: Not on file   Other Topics Concern  . Not on file   Social History Narrative     ROS A 10 point review  of system was performed. It is negative other than that mentioned in the history of present illness.   PHYSICAL EXAM   BP 118/78 mmHg  Pulse 74  Ht 6\' 2"  (1.88 m)  Wt 267 lb (121.11 kg)  BMI 34.27 kg/m2 Constitutional: He is oriented to person, place, and time. He appears well-developed and well-nourished. No distress.  HENT: No nasal discharge.  Head: Normocephalic and atraumatic.  Eyes: Pupils are equal and round.  No discharge. Neck: Normal range of motion. Neck supple. No JVD present. No thyromegaly present.  Cardiovascular: Normal rate, regular rhythm, normal heart sounds. Exam reveals no gallop and no friction rub. No murmur heard.  Pulmonary/Chest: Effort normal and breath sounds normal. No stridor. No respiratory distress. He has no  wheezes. He has no rales. He exhibits no tenderness.  Abdominal: Soft. Bowel sounds are normal. He exhibits no distension. There is no tenderness. There is no rebound and no guarding.  Musculoskeletal: Normal range of motion. He exhibits no edema and no tenderness.  Neurological: He is alert and oriented to person, place, and time. Coordination normal.  Skin: Skin is warm and dry. No rash noted. He is not diaphoretic. No erythema. No pallor.  Psychiatric: He has a normal mood and affect. His behavior is normal. Judgment and thought content normal.  Vascular: Radial pulses: +1 on the right side and +2 on the left side. Femoral pulses: +1 on the right side and +2 on the left side. Distal pulses are palpable bilaterally.     EKG: Normal sinus rhythm with no significant ST or T wave changes.   ASSESSMENT AND PLAN

## 2015-02-28 NOTE — Progress Notes (Signed)
Site area: right groin a 6 french long arterial sheath was removed  Site Prior to Removal:  Level 0  Pressure Applied For 15 MINUTES    Minutes Beginning at 1010am  Manual:   Yes.    Patient Status During Pull:  stable  Post Pull Groin Site:  Level 0  Post Pull Instructions Given:  Yes.    Post Pull Pulses Present:  Yes.    Dressing Applied:  Yes.    Comments:  VS remain stable during sheath pull

## 2015-02-28 NOTE — Interval H&P Note (Signed)
History and Physical Interval Note:  02/28/2015 8:39 AM  Brandon Parks  has presented today for surgery, with the diagnosis of pad  The various methods of treatment have been discussed with the patient and family. After consideration of risks, benefits and other options for treatment, the patient has consented to  Procedure(s): Abdominal Aortogram (N/A) as a surgical intervention .  The patient's history has been reviewed, patient examined, no change in status, stable for surgery.  I have reviewed the patient's chart and labs.  Questions were answered to the patient's satisfaction.     Lorine BearsMuhammad Arida

## 2015-02-28 NOTE — Discharge Instructions (Signed)
Start Plavix 75 mg once daily . [facility]. Angiogram, Care After    These instructions give you information about caring for yourself after your procedure. Your doctor may also give you more specific instructions. Call your doctor if you have any problems or questions after your procedure.  HOME CARE  Take medicines only as told by your doctor.  Follow your doctor's instructions about:  Care of the area where the tube was inserted.  Bandage (dressing) changes and removal.  You may shower 24-48 hours after the procedure or as told by your doctor.  Do not take baths, swim, or use a hot tub until your doctor approves.  Every day, check the area where the tube was inserted. Watch for:  Redness, swelling, or pain.  Fluid, blood, or pus.  Do not apply powder or lotion to the site.  Do not lift anything that is heavier than 10 lb (4.5 kg) for 5 days or as told by your doctor.  Ask your doctor when you can:  Return to work or school.  Do physical activities or play sports.  Have sex.  Do not drive or operate heavy machinery for 24 hours or as told by your doctor.  Have someone with you for the first 24 hours after the procedure.  Keep all follow-up visits as told by your doctor. This is important. GET HELP IF:  You have a fever.   You have chills.   You have more bleeding from the area where the tube was inserted. Hold pressure on the area.  You have redness, swelling, or pain in the area where the tube was inserted.  You have fluid or pus coming from the area. GET HELP RIGHT AWAY IF:   You have a lot of pain in the area where the tube was inserted.  The area where the tube was inserted is bleeding, and the bleeding does not stop after 30 minutes of holding steady pressure on the area.  The area near or just beyond the insertion site becomes pale, cool, tingly, or numb.   This information is not intended to replace advice given  to you by your health care provider. Make sure you discuss any questions you have with your health care provider.   Document Released: 07/04/2008 Document Revised: 04/28/2014 Document Reviewed: 09/08/2012 Elsevier Interactive Patient Education [employer]2016 Elsevier Inc.

## 2015-03-01 ENCOUNTER — Encounter (HOSPITAL_COMMUNITY): Payer: Self-pay | Admitting: Cardiovascular Disease

## 2015-03-01 MED FILL — Heparin Sodium (Porcine) 2 Unit/ML in Sodium Chloride 0.9%: INTRAMUSCULAR | Qty: 1000 | Status: AC

## 2015-03-01 MED FILL — Clopidogrel Bisulfate Tab 300 MG (Base Equiv): ORAL | Qty: 2 | Status: AC

## 2015-03-30 ENCOUNTER — Encounter: Payer: Self-pay | Admitting: Cardiovascular Disease

## 2015-03-30 ENCOUNTER — Ambulatory Visit (INDEPENDENT_AMBULATORY_CARE_PROVIDER_SITE_OTHER): Payer: 59 | Admitting: Cardiovascular Disease

## 2015-03-30 VITALS — BP 110/70 | HR 74 | Ht 74.0 in | Wt 265.2 lb

## 2015-03-30 DIAGNOSIS — I25118 Atherosclerotic heart disease of native coronary artery with other forms of angina pectoris: Secondary | ICD-10-CM

## 2015-03-30 DIAGNOSIS — E785 Hyperlipidemia, unspecified: Secondary | ICD-10-CM

## 2015-03-30 DIAGNOSIS — I10 Essential (primary) hypertension: Secondary | ICD-10-CM

## 2015-03-30 DIAGNOSIS — I739 Peripheral vascular disease, unspecified: Secondary | ICD-10-CM | POA: Diagnosis not present

## 2015-03-30 DIAGNOSIS — Z720 Tobacco use: Secondary | ICD-10-CM

## 2015-03-30 DIAGNOSIS — R079 Chest pain, unspecified: Secondary | ICD-10-CM

## 2015-03-30 NOTE — Assessment & Plan Note (Signed)
He reports increased frequency of chest pain. ECG is unremarkable. He has known history of coronary artery disease status post CABG. I requested a treadmill nuclear stress test.

## 2015-03-30 NOTE — Assessment & Plan Note (Signed)
His lipid profile was not optimal in the summer but he was not taking atorvastatin regularly. I stressed the importance of taking this medication on a regular basis and improving his overall lifestyle.

## 2015-03-30 NOTE — Assessment & Plan Note (Signed)
He is doing very well after recent stent placement to the right common iliac artery for severe claudication. No recurrent symptoms since then. Continue dual antiplatelet therapy which I think should be continued long-term given the presence of peripheral arterial disease and coronary artery disease. I requested an aortoiliac duplex an ABI.

## 2015-03-30 NOTE — Patient Instructions (Addendum)
Medication Instructions:  Your physician recommends that you continue on your current medications as directed. Please refer to the Current Medication list given to you today.   Labwork: none  Testing/Procedures: Your physician has requested that you have a lexiscan myoview. For further information please visit [email or web address] Please follow instruction sheet, as given.  [facility] MYOVIEW  Your caregiver has ordered a Stress Test with nuclear imaging. The purpose of this test is to evaluate the blood supply to your heart muscle. This procedure is referred to as a "Non-Invasive Stress Test." This is because other than having an IV started in your vein, nothing is inserted or "invades" your body. Cardiac stress tests are done to find areas of poor blood flow to the heart by determining the extent of coronary artery disease (CAD). Some patients exercise on a treadmill, which naturally increases the blood flow to your heart, while others who are  unable to walk on a treadmill due to physical limitations have a pharmacologic/chemical stress agent called Lexiscan . This medicine will mimic walking on a treadmill by temporarily increasing your coronary blood flow.   Please note: these test may take anywhere between 2-4 hours to complete  PLEASE REPORT TO [facility] ENTRANCE  THE VOLUNTEERS AT THE FIRST DESK WILL DIRECT YOU WHERE TO GO  Date of Procedure:_____Friday, Dec 16, 8:30am________________________________  Arrival Time for Procedure:_____________8:15am_____________  Instructions regarding medication:     _xx___:  Hold metoprolol  night before procedure and morning of procedure   PLEASE NOTIFY THE OFFICE AT LEAST 24 HOURS IN ADVANCE IF YOU ARE UNABLE TO KEEP YOUR APPOINTMENT.  [phone] AND  PLEASE NOTIFY NUCLEAR MEDICINE AT [facility] AT LEAST 24 HOURS IN ADVANCE IF YOU ARE UNABLE TO KEEP YOUR APPOINTMENT. [phone]  How to prepare for your Myoview test:   Do not eat or  drink after midnight  No caffeine for 24 hours prior to test  No smoking 24 hours prior to test.  Your medication may be taken with water.  If your doctor stopped a medication because of this test, do not take that medication.  Ladies, please do not wear dresses.  Skirts or pants are appropriate. Please wear a short sleeve shirt.  No perfume, cologne or lotion.  Wear comfortable walking shoes. No heels!          Your physician has requested that you have an ankle brachial index (ABI). During this test an ultrasound and blood pressure cuff are used to evaluate the arteries that supply the arms and legs with blood. Allow thirty minutes for this exam. There are no restrictions or special instructions.  Your physician has requested that you have an aorta iliac duplex. Allow 30 minutes for this exam. Do not eat after midnight the day before and avoid carbonated beverages    Follow-Up: Your physician wants you to follow-up in: 6 months You will receive a reminder letter in the mail two months in advance. If you don't receive a letter, please call our office to schedule the follow-up appointment.   Any Other Special Instructions Will Be Listed Below (If Applicable).     If you need a refill on your cardiac medications before your next appointment, please call your pharmacy.  Ankle-Brachial Index Test The ankle-brachial index (ABI) test is used to find peripheral vascular disease (PVD). PVD is also known as peripheral arterial disease (PAD). PVD is the blocking or hardening of the arteries anywhere within the circulatory system beyond the heart. PVD is caused by  cholesterol deposits in your blood vessels (atherosclerosis). These deposits cause arteries to narrow. The delivery of oxygen to your tissues is impaired as a result. This can cause muscle pain and fatigue. This is called claudication. PVD means there may also be buildup of cholesterol in your:  Heart. This increases the risk  of heart attacks.  Brain. This increases the risk of strokes. The ankle-brachial index test measures the blood flow in your arms and legs. This test also determines if blood vessels in your leg are narrowed by cholesterol deposits. There are additional causes of a reduced ankle-brachial index, such as inflammation of vessels or a clot in the vessels. However, these are much less common than narrowing due to cholesterol deposits. HOW IS THE ANKLE-BRACHIAL INDEX TEST DONE? The test is done while you are lying down and resting. Measurements are taken of the systolic pressure:  In your arm (brachial).  In your ankle at several points along your leg. Systolic pressure is the pressure inside your arteries when your heart pumps. The measurements are taken several times on both sides. Then, the highest systolic pressure of the ankle is divided by the highest brachial systolic pressure. The result is the ankle-brachial pressure ratio, or ABI. Sometimes this test is repeated after you have exercised on a treadmill for five minutes. You may have leg pain during the exercise portion of the test if you suffer from PAD. If the index number drops after exercise, this may show that PAD is present. A normal ABI ratio is between 0.9 and 1.4. A value below 0.9 is considered abnormal.    This information is not intended to replace advice given to you by your health care provider. Make sure you discuss any questions you have with your health care provider.   Document Released: 04/11/2004 Document Revised: 04/28/2014 Document Reviewed: 11/11/2013 Elsevier Interactive Patient Education 2016 Elsevier Inc. Cardiac Nuclear Scanning A cardiac nuclear scan is used to check your heart for problems, such as the following:  A portion of the heart is not getting enough blood.  Part of the heart muscle has died, which happens with a heart attack.  The heart wall is not working normally.  In this test, a radioactive dye  (tracer) is injected into your bloodstream. After the tracer has traveled to your heart, a scanning device is used to measure how much of the tracer is absorbed by or distributed to various areas of your heart. LET [facility] PROVID[facility] KNOW ABOUT:  Any allergies you have.  All medicines you are taking, including vitamins, herbs, eye drops, creams, and over-the-counter medicines.  Previous problems you or members of your family have had with the use of anesthetics.  Any blood disorders you have.  Previous surgeries you have had.  Medical conditions you have.  RISKS AND COMPLICATIONS Generally, this is a safe procedure. However, as with any procedure, problems can occur. Possible problems include:   Serious chest pain.  Rapid heartbeat.  Sensation of warmth in your chest. This usually passes quickly. BEFORE THE PROCEDURE Ask your health care provider about changing or stopping your regular medicines. PROCEDURE This procedure is usually done at a hospital and takes 2-4 hours.  An IV tube is inserted into one of your veins.  Your health care provider will inject a small amount of radioactive tracer through the tube.  You will then wait for 20-40 minutes while the tracer travels through your bloodstream.  You will lie down on an exam table so  images of your heart can be taken. Images will be taken for about 15-20 minutes.  You will exercise on a treadmill or stationary bike. While you exercise, your heart activity will be monitored with an electrocardiogram (ECG), and your blood pressure will be checked.  If you are unable to exercise, you may be given a medicine to make your heart beat faster.  When blood flow to your heart has peaked, tracer will again be injected through the IV tube.  After 20-40 minutes, you will get back on the exam table and have more images taken of your heart.  When the procedure is over, your IV tube will be removed. AFTER THE PROCEDURE  You  will likely be able to leave shortly after the test. Unless your health care provider tells you otherwise, you may return to your normal schedule, including diet, activities, and medicines.  Make sure you find out how and when you will get your test results.   This information is not intended to replace advice given to you by your health care provider. Make sure you discuss any questions you have with your health care provider.   Document Released: 05/02/2004 Document Revised: 04/12/2013 Document Reviewed: 03/16/2013 Elsevier Interactive Patient Education [employer]

## 2015-03-30 NOTE — Progress Notes (Signed)
Primary cardiologist: Dr. Mariah MillingGollan  HPI  Mr. Brandon Parks is a 34 year old gentleman who is here today for a follow-up visit of  peripheral arterial disease. He has known history of coronary artery disease status post CABG in July 2013 for left main disease/dissection, tobacco use and hyperlipidemia.   He developed recurrent chest pain August  2013 and [facility] and [facility]. He reports that something was "blocked up" but the details are not available to us. He  recovered from his surgery.  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  He was seen recently for severe right leg claudication.  He underwent noninvasive evaluation which showed normal ABI bilaterally. 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 I proceeded with angiography which showed severe right common iliac artery stenosis and mild left common iliac artery stenosis. A balloon expandable stent was placed to the right common iliac artery without complications. He reports resolution of claudication completely. He complains of increased frequency of chest pain described as tightness both at rest and occasionally with physical activities. This has been going on for a few months now.   No Known Allergies   Current Outpatient Prescriptions on File Prior to Visit  Medication Sig Dispense Refill  . aspirin 81 MG tablet Take 81 mg by mouth daily.    Marland Kitchen. atorvastatin (LIPITOR) 40 MG tablet Take 0.5 tablets (20 mg total) by mouth daily. 90 tablet 3  . clopidogrel (PLAVIX) 75 MG tablet Take 1 tablet (75 mg total) by mouth daily. 30 tablet 2  . metoprolol tartrate (LOPRESSOR) 25 MG tablet Take 1 tablet (25 mg total) by mouth 2 (two) times daily. 180 tablet 3  . nitroGLYCERIN (NITROSTAT) 0.4 MG SL tablet  Place 1 tablet (0.4 mg total) under the tongue every 5 (five) minutes as needed. 25 tablet 6   No current facility-administered medications on file prior to visit.     Past Medical History  Diagnosis Date  . Tendinitis     bilateral  . GERD (gastroesophageal reflux disease)   . Carotid artery occlusion   . NSTEMI (non-ST elevated myocardial infarction) (HCC)   . CAD (coronary artery disease)   . Pneumothorax on left   . MI (myocardial infarction) (HCC) 2013     Past Surgical History  Procedure Laterality Date  . Appendectomy    . Coronary artery bypass graft  2013    @ [facility]  . Cardiac catheterization  2013    @ [facility]; [facility]  . Coronary angioplasty  2013    @[facility]   . Cardiac catheterization  Aug 2013    @ [facility]  . Peripheral vascular catheterization N/A 02/28/2015    Procedure: Abdominal Aortogram;  Surgeon: Iran OuchMuhammad A Brianna Bennett, MD;  Location: [facility] INVASIVE CV LAB;  Service: Cardiovascular;  Laterality: N/A;     Family History  Problem Relation Age of Onset  . Family history unknown: Yes     Social History   Social History  . Marital Status: Married    Spouse Name: N/A  . Number of Children: N/A  . Years of Education: N/A   Occupational History  . Not on file.   Social History Main Topics  . Smoking status: Current Every Day Smoker -- 1.00 packs/day for 7 years    Types: Cigarettes  Last Attempt to Quit: 11/09/2011  . Smokeless tobacco: Not on file  . Alcohol Use: 1.2 oz/week    2 Cans of beer per week     Comment: ocassionally  . Drug Use: Yes    Special: Marijuana     Comment: past  . Sexual Activity: Not on file   Other Topics Concern  . Not on file   Social History Narrative     ROS A 10 point review of system was performed. It is negative other than that mentioned in the history of present illness.   PHYSICAL EXAM   BP 110/70 mmHg  Pulse 74  Ht  (1.88 m)  Wt 265 lb 4 oz (120.317 kg)  BMI 34.04 kg/m2 Constitutional: He is  oriented to person, place, and time. He appears well-developed and well-nourished. No distress.  HENT: No nasal discharge.  Head: Normocephalic and atraumatic.  Eyes: Pupils are equal and round.  No discharge. Neck: Normal range of motion. Neck supple. No JVD present. No thyromegaly present.  Cardiovascular: Normal rate, regular rhythm, normal heart sounds. Exam reveals no gallop and no friction rub. No murmur heard.  Pulmonary/Chest: Effort normal and breath sounds normal. No stridor. No respiratory distress. He has no wheezes. He has no rales. He exhibits no tenderness.  Abdominal: Soft. Bowel sounds are normal. He exhibits no distension. There is no tenderness. There is no rebound and no guarding.  Musculoskeletal: Normal range of motion. He exhibits no edema and no tenderness.  Neurological: He is alert and oriented to person, place, and time. Coordination normal.  Skin: Skin is warm and dry. No rash noted. He is not diaphoretic. No erythema. No pallor.  Psychiatric: He has a normal mood and affect. His behavior is normal. Judgment and thought content normal.  Vascular: Radial pulses: +1 on the right side and +2 on the left side. Femoral pulses: Normal bilaterally.  No groin hematoma   EKG: Normal sinus rhythm with no significant ST or T wave changes.   ASSESSMENT AND PLAN

## 2015-03-30 NOTE — Assessment & Plan Note (Signed)
I had a prolonged discussion with him about the importance of smoking cessation. He wants to try electronic cigarettes. His wife was successful in quitting smoking few years ago with Chantix.

## 2015-04-05 ENCOUNTER — Telehealth: Payer: Self-pay

## 2015-04-05 NOTE — Telephone Encounter (Signed)
Reviewed treadmill myoview appt and instructions w/pt. He is agreeable w/plan and states [facility], 8[facility]15[facility]. Pt had no further questions.

## 2015-04-06 ENCOUNTER — Encounter
Admission: RE | Admit: 2015-04-06 | Discharge: 2015-04-06 | Disposition: A | Discharge status: Home / Self Care | Payer: 59 | Source: Ambulatory Visit | Attending: Cardiovascular Disease | Admitting: Cardiovascular Disease

## 2015-04-06 DIAGNOSIS — R079 Chest pain, unspecified: Secondary | ICD-10-CM | POA: Diagnosis present

## 2015-04-06 LAB — NM MYOCAR MULTI W/SPECT W/WALL MOTION / EF
CHL CUP NUCLEAR SDS: 1
CHL CUP NUCLEAR SRS: 13
CHL CUP RESTING HR STRESS: 92 {beats}/min
LV sys vol: 90 mL
LVDIAVOL: 157 mL
NUC STRESS TID: 1.08
Peak HR: 116 {beats}/min
Percent HR: 62 %
SSS: 12

## 2015-04-06 MED ORDER — TECHNETIUM TC 99M SESTAMIBI - CARDIOLITE
30.0000 | Freq: Once | INTRAVENOUS | Status: AC | PRN
  Administered 2015-04-06: 10:00:00 29.38 via INTRAVENOUS

## 2015-04-06 MED ORDER — REGADENOSON 0.4 MG/5ML IV SOLN
0.4000 mg | Freq: Once | INTRAVENOUS | Status: AC
  Administered 2015-04-06: 0.4 mg via INTRAVENOUS

## 2015-04-06 MED ORDER — TECHNETIUM TC 99M SESTAMIBI - CARDIOLITE
10.0000 | Freq: Once | INTRAVENOUS | Status: AC | PRN
  Administered 2015-04-06: 09:00:00 12.95 via INTRAVENOUS

## 2015-04-20 ENCOUNTER — Ambulatory Visit: Payer: 59

## 2015-04-20 ENCOUNTER — Other Ambulatory Visit: Payer: Self-pay | Admitting: Cardiovascular Disease

## 2015-04-20 DIAGNOSIS — Z95820 Peripheral vascular angioplasty status with implants and grafts: Secondary | ICD-10-CM

## 2015-04-20 DIAGNOSIS — I739 Peripheral vascular disease, unspecified: Secondary | ICD-10-CM | POA: Diagnosis not present

## 2015-04-20 DIAGNOSIS — Z959 Presence of cardiac and vascular implant and graft, unspecified: Secondary | ICD-10-CM | POA: Diagnosis not present

## 2015-04-30 ENCOUNTER — Other Ambulatory Visit: Payer: Self-pay

## 2015-04-30 DIAGNOSIS — I6523 Occlusion and stenosis of bilateral carotid arteries: Secondary | ICD-10-CM

## 2015-05-24 ENCOUNTER — Encounter (HOSPITAL_COMMUNITY): Payer: Self-pay | Admitting: *Deleted

## 2015-10-18 ENCOUNTER — Other Ambulatory Visit: Payer: Self-pay | Admitting: Cardiovascular Disease

## 2015-10-18 DIAGNOSIS — I779 Disorder of arteries and arterioles, unspecified: Secondary | ICD-10-CM

## 2015-10-26 ENCOUNTER — Ambulatory Visit: Payer: 59

## 2015-10-26 ENCOUNTER — Encounter (INDEPENDENT_AMBULATORY_CARE_PROVIDER_SITE_OTHER): Payer: Self-pay

## 2015-10-26 ENCOUNTER — Other Ambulatory Visit: Payer: Self-pay | Admitting: Cardiovascular Disease

## 2015-10-26 DIAGNOSIS — I779 Disorder of arteries and arterioles, unspecified: Secondary | ICD-10-CM

## 2015-10-30 ENCOUNTER — Telehealth: Payer: Self-pay | Admitting: Cardiovascular Disease

## 2015-10-30 NOTE — Telephone Encounter (Signed)
3 attempts to schedule fu from recall list.  Deleting recall.  °

## 2015-11-02 ENCOUNTER — Other Ambulatory Visit: Payer: Self-pay

## 2015-11-02 DIAGNOSIS — I739 Peripheral vascular disease, unspecified: Secondary | ICD-10-CM

## 2015-12-26 ENCOUNTER — Telehealth: Payer: Self-pay | Admitting: Cardiovascular Disease

## 2015-12-26 NOTE — Telephone Encounter (Signed)
S/w pt who reports right leg numbness when walking, similar to sx experienced prior to stent placement in Nov 2016. Denies any discoloration or leg coolness. He is concerned the "stent may have come out" and would like to know if it is guaranteed.  July 7 duplex results: "Inform patient that duplex showed re- narrwoing of right iliac stent but the blood flow is still reasonable . Continue to monitor symptoms, work on quitting smoking and repeat duplex in 6 months,"  Pt states he is still smoking. Advised pt to work on quitting as suggested at prior office visits. He is agreeable to follow up visit w/Dr. Kirke CorinArida on Sept 7 @ 3:15pm.

## 2015-12-26 NOTE — Telephone Encounter (Signed)
Pt states he would like to know more information about the stent in his leg. States when he walks he is uncomfortable. Please call.

## 2015-12-27 ENCOUNTER — Ambulatory Visit (INDEPENDENT_AMBULATORY_CARE_PROVIDER_SITE_OTHER): Payer: 59 | Admitting: Cardiovascular Disease

## 2015-12-27 ENCOUNTER — Encounter: Payer: Self-pay | Admitting: Cardiovascular Disease

## 2015-12-27 VITALS — BP 110/72 | HR 71 | Ht 74.0 in | Wt 258.8 lb

## 2015-12-27 DIAGNOSIS — I25118 Atherosclerotic heart disease of native coronary artery with other forms of angina pectoris: Secondary | ICD-10-CM

## 2015-12-27 DIAGNOSIS — E785 Hyperlipidemia, unspecified: Secondary | ICD-10-CM | POA: Diagnosis not present

## 2015-12-27 DIAGNOSIS — I10 Essential (primary) hypertension: Secondary | ICD-10-CM | POA: Diagnosis not present

## 2015-12-27 DIAGNOSIS — Z720 Tobacco use: Secondary | ICD-10-CM

## 2015-12-27 DIAGNOSIS — I739 Peripheral vascular disease, unspecified: Secondary | ICD-10-CM | POA: Diagnosis not present

## 2015-12-27 MED ORDER — ATORVASTATIN CALCIUM 40 MG PO TABS: 20.0000 mg | ORAL_TABLET | Freq: Every day | ORAL | 3 refills | Status: DC

## 2015-12-27 NOTE — Patient Instructions (Addendum)
Medication Instructions:  Your physician recommends that you continue on your current medications as directed. Please refer to the Current Medication list given to you today.   Labwork: none  Testing/Procedures: You will need aorta iliac duplex in January 2018  Follow-Up: Your physician wants you to follow-up in: January 2018 with Dr. Katheren PullerArida You will receive a reminder letter in the mail two months in advance. If you don't receive a letter, please call our office to schedule the follow-up appointment.   Any Other Special Instructions Will Be Listed Below (If Applicable).     If you need a refill on your cardiac medications before your next appointment, please call your pharmacy.   Steps to Quit Smoking  Smoking tobacco can be harmful to your health and can affect almost every organ in your body. Smoking puts you, and those around you, at risk for developing many serious chronic diseases. Quitting smoking is difficult, but it is one of the best things that you can do for your health. It is never too late to quit. WHAT ARE THE BENEFITS OF QUITTING SMOKING? When you quit smoking, you lower your risk of developing serious diseases and conditions, such as:  Lung cancer or lung disease, such as COPD.  Heart disease.  Stroke.  Heart attack.  Infertility.  Osteoporosis and bone fractures. Additionally, symptoms such as coughing, wheezing, and shortness of breath may get better when you quit. You may also find that you get sick less often because your body is stronger at fighting off colds and infections. If you are pregnant, quitting smoking can help to reduce your chances of having a baby of low birth weight. HOW DO I GET READY TO QUIT? When you decide to quit smoking, create a plan to make sure that you are successful. Before you quit:  Pick a date to quit. Set a date within the next two weeks to give you time to prepare.  Write down the reasons why you are quitting. Keep this  list in places where you will see it often, such as on your bathroom mirror or in your car or wallet.  Identify the people, places, things, and activities that make you want to smoke (triggers) and avoid them. Make sure to take these actions:  Throw away all cigarettes at home, at work, and in your car.  Throw away smoking accessories, such as Set designerashtrays and lighters.  Clean your car and make sure to empty the ashtray.  Clean your home, including curtains and carpets.  Tell your family, friends, and coworkers that you are quitting. Support from your loved ones can make quitting easier.  Talk with your health care provider about your options for quitting smoking.  Find out what treatment options are covered by your health insurance. WHAT STRATEGIES CAN I USE TO QUIT SMOKING?  Talk with your healthcare provider about different strategies to quit smoking. Some strategies include:  Quitting smoking altogether instead of gradually lessening how much you smoke over a period of time. Research shows that quitting "cold [place]key" is more successful than gradually quitting.  Attending in-person counseling to help you build problem-solving skills. You are more likely to have success in quitting if you attend several counseling sessions. Even short sessions of 10 minutes can be effective.  Finding resources and support systems that can help you to quit smoking and remain smoke-free after you quit. These resources are most helpful when you use them often. They can include:  Online chats with a Veterinary surgeoncounselor.  Telephone quitlines.  Printed Materials engineer.  Support groups or group counseling.  Text messaging programs.  Mobile phone applications.  Taking medicines to help you quit smoking. (If you are pregnant or breastfeeding, talk with your health care provider first.) Some medicines contain nicotine and some do not. Both types of medicines help with cravings, but the medicines that include  nicotine help to relieve withdrawal symptoms. Your health care provider may recommend:  Nicotine patches, gum, or lozenges.  Nicotine inhalers or sprays.  Non-nicotine medicine that is taken by mouth. Talk with your health care provider about combining strategies, such as taking medicines while you are also receiving in-person counseling. Using these two strategies together makes you more likely to succeed in quitting than if you used either strategy on its own. If you are pregnant or breastfeeding, talk with your health care provider about finding counseling or other support strategies to quit smoking. Do not take medicine to help you quit smoking unless told to do so by your health care provider. WHAT THINGS CAN I DO TO MAKE IT EASIER TO QUIT? Quitting smoking might feel overwhelming at first, but there is a lot that you can do to make it easier. Take these important actions:  Reach out to your family and friends and ask that they support and encourage you during this time. Call telephone quitlines, reach out to support groups, or work with a counselor for support.  Ask people who smoke to avoid smoking around you.  Avoid places that trigger you to smoke, such as bars, parties, or smoke-break areas at work.  Spend time around people who do not smoke.  Lessen stress in your life, because stress can be a smoking trigger for some people. To lessen stress, try:  Exercising regularly.  Deep-breathing exercises.  Yoga.  Meditating.  Performing a body scan. This involves closing your eyes, scanning your body from head to toe, and noticing which parts of your body are particularly tense. Purposefully relax the muscles in those areas.  Download or purchase mobile phone or tablet apps (applications) that can help you stick to your quit plan by providing reminders, tips, and encouragement. There are many free apps, such as QuitGuide from the [employer] developer for Disease Control and Prevention).  You can find other support for quitting smoking (smoking cessation) through smokefree.gov and other websites. HOW WILL I FEEL WHEN I QUIT SMOKING? Within the first 24 hours of quitting smoking, you may start to feel some withdrawal symptoms. These symptoms are usually most noticeable 2-3 days after quitting, but they usually do not last beyond 2-3 weeks. Changes or symptoms that you might experience include:  Mood swings.  Restlessness, anxiety, or irritation.  Difficulty concentrating.  Dizziness.  Strong cravings for sugary foods in addition to nicotine.  Mild weight gain.  Constipation.  Nausea.  Coughing or a sore throat.  Changes in how your medicines work in your body.  A depressed mood.  Difficulty sleeping (insomnia). After the first 2-3 weeks of quitting, you may start to notice more positive results, such as:  Improved sense of smell and taste.  Decreased coughing and sore throat.  Slower heart rate.  Lower blood pressure.  Clearer skin.  The ability to breathe more easily.  Fewer sick days. Quitting smoking is very challenging for most people. Do not get discouraged if you are not successful the first time. Some people need to make many attempts to quit before they achieve long-term success. Do your best to stick  to your quit plan, and talk with your health care provider if you have any questions or concerns.   This information is not intended to replace advice given to you by your health care provider. Make sure you discuss any questions you have with your health care provider.   Document Released: 04/01/2001 Document Revised: 08/22/2014 Document Reviewed: 08/22/2014 Elsevier Interactive Patient Education [employer].

## 2015-12-27 NOTE — Progress Notes (Signed)
Cardiology Office Note   Date:  12/27/2015   ID:  Brandon Parks, DOB [birth year], MRN [id number]  PCP:  No PCP Per Patient  Cardiologist:   Lorine Bears, MD   Chief Complaint  Patient presents with  . Other    6 month f/u c/o right leg numbness/discomfort.  Pt mentioned he stopped Plavix about 1 yr ago, metoprolol 6 months ago and Lipitor has not taken for 2-3 days. Meds reviewd verbally with pt.      History of Present Illness: Brandon Parks is a 35 y.o. male who presents for  a follow-up visit of  peripheral arterial disease and coronary artery disease. He has known history of coronary artery disease status post CABG in July 2013 for ostial LAD plaque rupture with extensive thrombus and possible dissection , tobacco use and hyperlipidemia.  He was seen in 2016 for severe right leg claudication.  He underwent noninvasive evaluation which showed normal ABI bilaterally. 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 I proceeded with angiography which showed severe right common iliac artery stenosis and mild left common iliac artery stenosis. A balloon expandable stent was placed to the right common iliac artery without complications. He reports recurrent claudication affecting the right leg since May but not as severe as before stenting. Unfortunately, he continues to smoke and has stopped taking atorvastatin. He denies any chest pain or shortness of breath.   Past Medical History:  Diagnosis Date  . CAD (coronary artery disease)   . Carotid artery occlusion   . GERD (gastroesophageal reflux disease)   . MI (myocardial infarction) (HCC) 2013  . NSTEMI (non-ST elevated myocardial infarction) (HCC)   . Pneumothorax on left   . Tendinitis    bilateral    Past Surgical History:  Procedure Laterality Date  . APPENDECTOMY    . CARDIAC CATHETERIZATION   2013   @ [facility]; [facility]  . CARDIAC CATHETERIZATION  Aug 2013   @ [facility]  . CORONARY ANGIOPLASTY  2013   @[email or web address]   . CORONARY ARTERY BYPASS GRAFT  2013   @ [facility]  . PERIPHERAL VASCULAR CATHETERIZATION N/A 02/28/2015   Procedure: Abdominal Aortogram;  Surgeon: Iran Ouch, MD;  Location: [facility];  Service: Cardiovascular;  Laterality: N/A;     Current Outpatient Prescriptions  Medication Sig Dispense Refill  . aspirin 81 MG tablet Take 81 mg by mouth daily.    . nitroGLYCERIN (NITROSTAT) 0.4 MG SL tablet Place 1 tablet (0.4 mg total) under the tongue every 5 (five) minutes as needed. 25 tablet 6  . atorvastatin (LIPITOR) 40 MG tablet Take 0.5 tablets (20 mg total) by mouth daily. (Patient not taking: Reported on 12/27/2015) 90 tablet 3   No current facility-administered medications for this visit.     Allergies:   Review of patient's allergies indicates no known allergies.    Social History:  The patient  reports that he has been smoking Cigarettes.  He has a 7.00 pack-year smoking history. He has never used smokeless tobacco. He reports that he drinks about 1.2 oz of alcohol per week . He reports that he uses drugs, including Marijuana.   Family History:  The patient's Family history is unknown by patient.    ROS:  Please see the history of present illness.   Otherwise, review of systems are positive for none.  All other systems are reviewed and negative.    PHYSICAL EXAM: VS:  BP 110/72 (BP Location: Left Arm, Patient Position: Sitting, Cuff Size: Normal)   Pulse 71   Ht 6\' 2"  (1.88 m)   Wt 258 lb 12 oz (117.4 kg)   BMI 33.22 kg/m  , BMI Body mass index is 33.22 kg/m. GEN: Well nourished, well developed, in no acute distress  HEENT: normal  Neck: no JVD, carotid bruits, or masses Cardiac: RRR; no murmurs, rubs, or gallops,no edema  Respiratory:  clear to auscultation bilaterally, normal work of breathing GI: soft, nontender, nondistended, + BS MS: no deformity  or atrophy  Skin: warm and dry, no rash Neuro:  Strength and sensation are intact Psych: euthymic mood, full affect Vascular: Femoral pulses are only mildly diminished bilaterally. Dorsalis pedis is +1 bilaterally.  EKG:  EKG is ordered today. The ekg ordered today demonstrates normal sinus rhythm with no significant ST or T wave changes.   Recent Labs: 02/26/2015: BUN 12; Creatinine, Ser 1.01; Platelets 220; Potassium 4.5; Sodium 142    Lipid Panel No results found for: CHOL, TRIG, HDL, CHOLHDL, VLDL, LDLCALC, LDLDIRECT    Wt Readings from Last 3 Encounters:  12/27/15 258 lb 12 oz (117.4 kg)  03/30/15 265 lb 4 oz (120.3 kg)  02/26/15 267 lb (121.1 kg)      No flowsheet data found.    ASSESSMENT AND PLAN:  1.  Peripheral arterial disease: Moderate right leg claudication with evidence of in-stent restenosis in the right common iliac artery. His symptoms are not as severe as before stenting last year. Unfortunately, he continues to smoke which is likely contributing to restenosis and he stopped taking atorvastatin. Given that his symptoms are currently not severe, I recommend continuing medical therapy, walking program and attempt smoking cessation. Repeat aortoiliac duplex in January and if symptoms worsen, I recommend proceeding with repeat angiography and possible covered stent placement.  2. Coronary artery disease involving native coronary arteries without angina: Continue medical therapy.  3. Hyperlipidemia: I refilled his atorvastatin and strongly advised him to take this medication regularly.  4. Tobacco use: I had a prolonged discussion with him about the importance of smoking cessation.    Disposition:   FU with me in 4 months  Signed,  Lorine BearsMuhammad Mohsin Crum, MD  12/27/2015 3:26 PM    [facility]

## 2016-02-06 ENCOUNTER — Telehealth: Payer: Self-pay | Admitting: Cardiovascular Disease

## 2016-02-06 NOTE — Telephone Encounter (Addendum)
Pt states he does not need smoking issue addressed on [employer] Screenings form, just weight. He states he has lost 10lbs "recenlty". Records indicate 8 lb loss 03/30/15- 12/27/15. Informed pt I will ask Dr. Kirke CorinArida if he will document this information on form but can not address any further weight loss.  Pt agreeable w/plan.  Forward to MD

## 2016-02-06 NOTE — Telephone Encounter (Signed)
Pt brought [employer] Screenings form by requesting completion by Dr.Arida. Left message on pt cell VM that PCP will need to complete form. Mailed to patient's home address.

## 2016-02-06 NOTE — Telephone Encounter (Signed)
Pt called back and states he does not have PCP and would like Dr. Kirke CorinArida to reconsider completing [employer] Screening. Would like it to state he is working on losing weight and smoking cessation. Reports 1/2 pack/day smoker. States Dr. Mariah MillingGollan has filled this out for him before.

## 2016-02-12 ENCOUNTER — Telehealth: Payer: Self-pay | Admitting: Cardiovascular Disease

## 2016-02-12 NOTE — Telephone Encounter (Signed)
Informed pt that eHealth screening form ready for pick up. He understands that Dec 2016 and September 2017 weights were documented only. No other information regarding attempted weight loss has been referenced. Placed at front desk.

## 2016-06-19 ENCOUNTER — Other Ambulatory Visit: Payer: Self-pay | Admitting: Cardiovascular Disease

## 2016-06-19 DIAGNOSIS — I779 Disorder of arteries and arterioles, unspecified: Secondary | ICD-10-CM

## 2016-06-20 ENCOUNTER — Ambulatory Visit: Payer: 59

## 2016-06-20 ENCOUNTER — Encounter: Payer: Self-pay | Admitting: Cardiovascular Disease

## 2016-06-20 ENCOUNTER — Ambulatory Visit (INDEPENDENT_AMBULATORY_CARE_PROVIDER_SITE_OTHER): Payer: 59 | Admitting: Cardiovascular Disease

## 2016-06-20 VITALS — BP 110/72 | HR 74 | Ht 74.0 in | Wt 261.8 lb

## 2016-06-20 DIAGNOSIS — I739 Peripheral vascular disease, unspecified: Secondary | ICD-10-CM | POA: Diagnosis not present

## 2016-06-20 DIAGNOSIS — Z01812 Encounter for preprocedural laboratory examination: Secondary | ICD-10-CM

## 2016-06-20 DIAGNOSIS — I10 Essential (primary) hypertension: Secondary | ICD-10-CM | POA: Diagnosis not present

## 2016-06-20 DIAGNOSIS — I25118 Atherosclerotic heart disease of native coronary artery with other forms of angina pectoris: Secondary | ICD-10-CM | POA: Diagnosis not present

## 2016-06-20 DIAGNOSIS — I779 Disorder of arteries and arterioles, unspecified: Secondary | ICD-10-CM

## 2016-06-20 MED ORDER — CLOPIDOGREL BISULFATE 75 MG PO TABS: 75.0000 mg | ORAL_TABLET | Freq: Every day | ORAL | 3 refills | Status: DC

## 2016-06-20 MED ORDER — CLOPIDOGREL BISULFATE 75 MG PO TABS: 75.0000 mg | ORAL_TABLET | Freq: Every day | ORAL | 5 refills | Status: DC

## 2016-06-20 NOTE — Patient Instructions (Addendum)
Medication Instructions:  Your physician has recommended you make the following change in your medication:  START taking Plavix 75mg  once daily  Labwork: BMET, CBC, PT/INR today  Testing/Procedures: Abdominal aortogram with lower extremity angiography Wednesday, March 7 Ar[facility] 11[facility]30am  [facility] [address] Short Stay Entrance [place] [phone]  Nothing to eat or drink after midnight the evening before your procedure. Please pack an overnight bag. You may take all of your morning medications with a sip of water.  Follow-Up: Your physician recommends that you schedule a follow-up appointment in: one month with Dr. Kirke CorinArida.    Any Other Special Instructions Will Be Listed Below (If Applicable).     If you need a refill on your cardiac medications before your next appointment, please call your pharmacy.

## 2016-06-20 NOTE — Progress Notes (Signed)
  Cardiology Office Note   Date:  06/20/2016   ID:  Brandon Parks, DOB 05/08/1980, MRN [id number]  PCP:  No PCP Per Patient  Cardiologist:   Johan Creveling, MD   Chief Complaint  Patient presents with  . OTHER    4 month f/u c/o right leg pain/numbness . Meds reviewed verbally with pt.      History of Present Illness: Brandon Parks is a 35 y.o. male who presents for  a follow-up visit of  peripheral arterial disease and coronary artery disease. He has known history of coronary artery disease status post CABG in July 2013 for ostial LAD plaque rupture with extensive thrombus and possible dissection , tobacco use and hyperlipidemia.  He was seen in 2016 for severe right leg claudication.  He underwent noninvasive evaluation which showed normal ABI bilaterally. 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 Angiography confirmed severeRight common iliac artery stenosis that was treated successfully with stent placement.  The patient developed recurrent claudication over the last 6 months with evidence of restenosis. He attempted a walking program with no improvement in symptoms. If anything, his claudication is getting worse and currently happening after walking only 20 feet. This has significantly affected his ability to perform his work. Unfortunately, he has not been able to quit smoking but was able to cut down from 2 packs per day to one pack per day. She denies any chest pain or shortness of breath.    Past Medical History:  Diagnosis Date  . CAD (coronary artery disease)   . Carotid artery occlusion   . GERD (gastroesophageal reflux disease)   . MI (myocardial infarction) 2013  . NSTEMI (non-ST elevated myocardial infarction) (HCC)   . Pneumothorax on left   . Tendinitis    bilateral    Past Surgical History:  Procedure Laterality Date  .  APPENDECTOMY    . CARDIAC CATHETERIZATION  2013   @ [facility]; [facility]  . CARDIAC CATHETERIZATION  Aug 2013   @ [facility]  . CORONARY ANGIOPLASTY  2013   @[email or web address]  . CORONARY ARTERY BYPASS GRAFT  2013   @ [facility]  . PERIPHERAL VASCULAR CATHETERIZATION N/A 02/28/2015   Procedure: Abdominal Aortogram;  Surgeon: Yariela Tison A Margel Joens, MD;  Location: [facility];  Service: Cardiovascular;  Laterality: N/A;     Current Outpatient Prescriptions  Medication Sig Dispense Refill  . aspirin 81 MG tablet Take 81 mg by mouth daily.    . atorvastatin (LIPITOR) 40 MG tablet Take 0.5 tablets (20 mg total) by mouth daily. 30 tablet 3  . nitroGLYCERIN (NITROSTAT) 0.4 MG SL tablet Place 1 tablet (0.4 mg total) under the tongue every 5 (five) minutes as needed. 25 tablet 6   No current facility-administered medications for this visit.     Allergies:   Patient has no known allergies.    Social History:  The patient  reports that he has been smoking Cigarettes.  He has a 7.00 pack-year smoking history. He has never used smokeless tobacco. He reports that he drinks about 1.2 oz of alcohol per week . He reports that he uses drugs, including Marijuana.   Family History:  The patient's Family history is unknown by patient.    ROS:  Please see the history of present illness.   Otherwise, review of systems are positive for none.   All   other systems are reviewed and negative.    PHYSICAL EXAM: VS:  BP 110/72 (BP Location: Left Arm, Patient Position: Sitting, Cuff Size: Normal)   Pulse 74   Ht 6' 2" (1.88 m)   Wt 261 lb 12 oz (118.7 kg)   BMI 33.61 kg/m  , BMI Body mass index is 33.61 kg/m. GEN: Well nourished, well developed, in no acute distress  HEENT: normal  Neck: no JVD, carotid bruits, or masses Cardiac: RRR; no murmurs, rubs, or gallops,no edema  Respiratory:  clear to auscultation bilaterally, normal work of breathing GI: soft, nontender, nondistended, + BS MS: no deformity or atrophy  Skin: warm and  dry, no rash Neuro:  Strength and sensation are intact Psych: euthymic mood, full affect Vascular: Femoral pulse is barely palpable on the right and mildly diminished on the left  EKG:  EKG is ordered today. The ekg ordered today demonstrates normal sinus rhythm with no significant ST or T wave changes.   Recent Labs: No results found for requested labs within last 8760 hours.    Lipid Panel No results found for: CHOL, TRIG, HDL, CHOLHDL, VLDL, LDLCALC, LDLDIRECT    Wt Readings from Last 3 Encounters:  06/20/16 261 lb 12 oz (118.7 kg)  12/27/15 258 lb 12 oz (117.4 kg)  03/30/15 265 lb 4 oz (120.3 kg)      No flowsheet data found.    ASSESSMENT AND PLAN:  1.  Peripheral arterial disease: Currently with severe lifestyle limiting right leg claudication (Rutherford class III). A failed a walking program and his symptoms have progressed significantly. Vascular studies today showed a drop in ABI to 0.84 on the right and 1.08 on the left. Duplex showed significant bilateral iliac artery disease worse on the right side. Due to severity of his symptoms, I recommend proceeding with angiography and possible endovascular intervention with the intention to use covered stents. I discussed the procedure in detail as well as risks than benefits. The patient might require intervention on the left iliac artery as well given the significant progression of stenosis over the last 6 months. I elected to add clopidogrel 75 mg once daily  2. Coronary artery disease involving native coronary arteries without angina: Continue medical therapy.  3. Hyperlipidemia: Continue treatment with atorvastatin  4. Tobacco use: I again discussed with him the importance of smoking cessation. He was able to decrease the amount to one pack per day but has not been able to quit completely.    Disposition:   FU with me in 1 months  Signed,  Everette Mall, MD  06/20/2016 2:06 PM    [facility] 

## 2016-06-20 NOTE — Progress Notes (Signed)
Arterial  Studies revealed a high-grade stenosis in the proximal right common iliac artery, at the site of an 02/2015 stent placement. Referring provider alerted.

## 2016-06-21 LAB — VAS US LOWER EXTREMITY ARTERIAL DUPLEX
LEFT POPLITEAL PROX DYS VEL: 3 cm/s
LEFT SFA MID VEL: 0 cm/s
LEFT SFA PROX DYS VEL: 0 cm/s
LPOPPPSV: 46 cm/s
Left ant tibial mid sys: -28 cm/s
Left ant tibial mid sys: -3 cm/s
Left post tibial mid dia: 0 cm/s
Left super femoral dist sys PSV: -56 cm/s
Left super femoral mid sys PSV: -93 cm/s
Left super femoral prox sys PSV: -71 cm/s
RATIBMIDDIA: -4 cm/s
RIGHT ANT MID TIBIAL SYS PSV: -16 cm/s
RIGHT SUPER FEMORAL PROX EDV: -2 cm/s
RPEROMIDDIA: -4 cm/s
RPEROMIDSYS: -22 cm/s
RSFPPSV: -40 cm/s
RTIBMIDDIA: 3 cm/s
RTIBMIDSYS: 26 cm/s
RTPOPPROXDIA: 0 cm/s
RTSFADISTDIA: -4 cm/s
RTSFAMIDDIA: -7 cm/s
Right popliteal dist sys PSV: -24 cm/s
Right popliteal prox sys PSV: 21 cm/s
Right super femoral dist sys PSV: -30 cm/s
Right super femoral mid sys PSV: -41 cm/s
left post tibial mid sys: -39 cm/s

## 2016-06-21 LAB — BASIC METABOLIC PANEL
BUN/Creatinine Ratio: 14 (ref 9–20)
BUN: 13 mg/dL (ref 6–20)
CO2: 18 mmol/L (ref 18–29)
CREATININE: 0.95 mg/dL (ref 0.76–1.27)
Calcium: 9.3 mg/dL (ref 8.7–10.2)
Chloride: 103 mmol/L (ref 96–106)
GFR, EST AFRICAN AMERICAN: 119 mL/min/{1.73_m2} (ref >59)
GFR, EST NON AFRICAN AMERICAN: 103 mL/min/{1.73_m2} (ref >59)
Glucose: 117 mg/dL — ABNORMAL HIGH (ref 65–99)
POTASSIUM: 4.2 mmol/L (ref 3.5–5.2)
SODIUM: 143 mmol/L (ref 134–144)

## 2016-06-21 LAB — PROTIME-INR
INR: 1 (ref 0.8–1.2)
Prothrombin Time: 10.6 s (ref 9.1–12.0)

## 2016-06-21 LAB — CBC
HEMOGLOBIN: 16.8 g/dL (ref 13.0–17.7)
Hematocrit: 47 % (ref 37.5–51.0)
MCH: 31 pg (ref 26.6–33.0)
MCHC: 35.7 g/dL (ref 31.5–35.7)
MCV: 87 fL (ref 79–97)
PLATELETS: 167 10*3/uL (ref 150–379)
RBC: 5.42 x10E6/uL (ref 4.14–5.80)
RDW: 12.2 % — ABNORMAL LOW (ref 12.3–15.4)
WBC: 7 10*3/uL (ref 3.4–10.8)

## 2016-06-25 ENCOUNTER — Encounter (HOSPITAL_COMMUNITY)
Admission: RE | Disposition: A | Discharge status: Home / Self Care | Payer: Self-pay | Source: Ambulatory Visit | Attending: Cardiovascular Disease

## 2016-06-25 ENCOUNTER — Ambulatory Visit (HOSPITAL_COMMUNITY)
Admission: RE | Admit: 2016-06-25 | Discharge: 2016-06-25 | Disposition: A | Discharge status: Home / Self Care | Payer: 59 | Source: Ambulatory Visit | Attending: Cardiovascular Disease | Admitting: Cardiovascular Disease

## 2016-06-25 ENCOUNTER — Other Ambulatory Visit: Payer: Self-pay

## 2016-06-25 DIAGNOSIS — F17210 Nicotine dependence, cigarettes, uncomplicated: Secondary | ICD-10-CM | POA: Diagnosis not present

## 2016-06-25 DIAGNOSIS — Z951 Presence of aortocoronary bypass graft: Secondary | ICD-10-CM | POA: Diagnosis not present

## 2016-06-25 DIAGNOSIS — I70213 Atherosclerosis of native arteries of extremities with intermittent claudication, bilateral legs: Secondary | ICD-10-CM | POA: Insufficient documentation

## 2016-06-25 DIAGNOSIS — Y831 Surgical operation with implant of artificial internal device as the cause of abnormal reaction of the patient, or of later complication, without mention of misadventure at the time of the procedure: Secondary | ICD-10-CM | POA: Diagnosis not present

## 2016-06-25 DIAGNOSIS — I252 Old myocardial infarction: Secondary | ICD-10-CM | POA: Diagnosis not present

## 2016-06-25 DIAGNOSIS — Z7982 Long term (current) use of aspirin: Secondary | ICD-10-CM | POA: Diagnosis not present

## 2016-06-25 DIAGNOSIS — I6529 Occlusion and stenosis of unspecified carotid artery: Secondary | ICD-10-CM | POA: Diagnosis not present

## 2016-06-25 DIAGNOSIS — I2510 Atherosclerotic heart disease of native coronary artery without angina pectoris: Secondary | ICD-10-CM | POA: Diagnosis not present

## 2016-06-25 DIAGNOSIS — T82856A Stenosis of peripheral vascular stent, initial encounter: Secondary | ICD-10-CM | POA: Insufficient documentation

## 2016-06-25 DIAGNOSIS — I739 Peripheral vascular disease, unspecified: Secondary | ICD-10-CM

## 2016-06-25 DIAGNOSIS — E785 Hyperlipidemia, unspecified: Secondary | ICD-10-CM | POA: Insufficient documentation

## 2016-06-25 DIAGNOSIS — K219 Gastro-esophageal reflux disease without esophagitis: Secondary | ICD-10-CM | POA: Diagnosis not present

## 2016-06-25 HISTORY — PX: PERIPHERAL VASCULAR INTERVENTION: CATH118257

## 2016-06-25 HISTORY — PX: LOWER EXTREMITY ANGIOGRAPHY: CATH118251

## 2016-06-25 HISTORY — PX: ABDOMINAL AORTOGRAM: CATH118222

## 2016-06-25 LAB — POCT ACTIVATED CLOTTING TIME: ACTIVATED CLOTTING TIME: 241 s

## 2016-06-25 SURGERY — ABDOMINAL AORTOGRAM
Anesthesia: LOCAL

## 2016-06-25 MED ORDER — SODIUM CHLORIDE 0.9 % IV SOLN: 250.0000 mL | INTRAVENOUS | Status: DC | PRN

## 2016-06-25 MED ORDER — FENTANYL CITRATE (PF) 100 MCG/2ML IJ SOLN
INTRAMUSCULAR | Status: AC
  Filled 2016-06-25: qty 2

## 2016-06-25 MED ORDER — HEPARIN (PORCINE) IN NACL 2-0.9 UNIT/ML-% IJ SOLN
INTRAMUSCULAR | Status: DC | PRN
  Administered 2016-06-25: 1000 mL

## 2016-06-25 MED ORDER — HEPARIN SODIUM (PORCINE) 1000 UNIT/ML IJ SOLN
INTRAMUSCULAR | Status: DC | PRN
  Administered 2016-06-25: 9000 [IU] via INTRAVENOUS

## 2016-06-25 MED ORDER — CLOPIDOGREL BISULFATE 300 MG PO TABS
ORAL_TABLET | ORAL | Status: AC
  Filled 2016-06-25: qty 1

## 2016-06-25 MED ORDER — MORPHINE SULFATE (PF) 4 MG/ML IV SOLN: 2.0000 mg | INTRAVENOUS | Status: DC | PRN

## 2016-06-25 MED ORDER — MIDAZOLAM HCL 2 MG/2ML IJ SOLN
INTRAMUSCULAR | Status: DC | PRN
  Administered 2016-06-25: 2 mg via INTRAVENOUS
  Administered 2016-06-25 (×2): 1 mg via INTRAVENOUS

## 2016-06-25 MED ORDER — ASPIRIN 81 MG PO CHEW: 81.0000 mg | CHEWABLE_TABLET | Freq: Every day | ORAL | Status: DC

## 2016-06-25 MED ORDER — HEPARIN (PORCINE) IN NACL 2-0.9 UNIT/ML-% IJ SOLN
INTRAMUSCULAR | Status: AC
  Filled 2016-06-25: qty 1000

## 2016-06-25 MED ORDER — SODIUM CHLORIDE 0.9% FLUSH: 3.0000 mL | Freq: Two times a day (BID) | INTRAVENOUS | Status: DC

## 2016-06-25 MED ORDER — CLOPIDOGREL BISULFATE 300 MG PO TABS
ORAL_TABLET | ORAL | Status: DC | PRN
  Administered 2016-06-25: 300 mg via ORAL

## 2016-06-25 MED ORDER — LIDOCAINE HCL (PF) 1 % IJ SOLN
INTRAMUSCULAR | Status: DC | PRN
  Administered 2016-06-25: 20 mL

## 2016-06-25 MED ORDER — LIDOCAINE HCL (PF) 1 % IJ SOLN
INTRAMUSCULAR | Status: AC
  Filled 2016-06-25: qty 30

## 2016-06-25 MED ORDER — FENTANYL CITRATE (PF) 100 MCG/2ML IJ SOLN
INTRAMUSCULAR | Status: DC | PRN
  Administered 2016-06-25: 25 ug via INTRAVENOUS
  Administered 2016-06-25 (×3): 50 ug via INTRAVENOUS

## 2016-06-25 MED ORDER — MIDAZOLAM HCL 2 MG/2ML IJ SOLN
INTRAMUSCULAR | Status: AC
  Filled 2016-06-25: qty 2

## 2016-06-25 MED ORDER — SODIUM CHLORIDE 0.9% FLUSH: 3.0000 mL | INTRAVENOUS | Status: DC | PRN

## 2016-06-25 MED ORDER — ASPIRIN 81 MG PO CHEW
81.0000 mg | CHEWABLE_TABLET | ORAL | Status: AC
  Administered 2016-06-25: 81 mg via ORAL

## 2016-06-25 MED ORDER — SODIUM CHLORIDE 0.9 % IV SOLN
INTRAVENOUS | Status: DC
Start: 2016-06-25 — End: 2016-06-25
  Administered 2016-06-25: 13:00:00 via INTRAVENOUS

## 2016-06-25 MED ORDER — ASPIRIN 81 MG PO CHEW
CHEWABLE_TABLET | ORAL | Status: AC
  Administered 2016-06-25: 81 mg via ORAL
  Filled 2016-06-25: qty 1

## 2016-06-25 MED ORDER — IODIXANOL 320 MG/ML IV SOLN
INTRAVENOUS | Status: DC | PRN
  Administered 2016-06-25: 105 mL via INTRA_ARTERIAL

## 2016-06-25 MED ORDER — SODIUM CHLORIDE 0.9 % IV SOLN: INTRAVENOUS | Status: DC

## 2016-06-25 MED ORDER — HEPARIN SODIUM (PORCINE) 1000 UNIT/ML IJ SOLN
INTRAMUSCULAR | Status: AC
  Filled 2016-06-25: qty 1

## 2016-06-25 SURGICAL SUPPLY — 20 items
BALLN MUSTANG 7.0X20 75 (BALLOONS) ×3
BALLOON MUSTANG 7.0X20 75 (BALLOONS) ×2 IMPLANT
CATH ANGIO 5F PIGTAIL 65CM (CATHETERS) ×3 IMPLANT
COVER PRB 48X5XTLSCP FOLD TPE (BAG) ×2 IMPLANT
COVER PROBE 5X48 (BAG) ×1
DEVICE CLOSURE MYNXGRIP 6/7F (Vascular Products) ×6 IMPLANT
KIT ENCORE 26 ADVANTAGE (KITS) ×6 IMPLANT
KIT PV (KITS) ×3 IMPLANT
SHEATH BRITE TIP 7FR 35CM (SHEATH) ×6 IMPLANT
SHEATH PINNACLE 5F 10CM (SHEATH) ×3 IMPLANT
SHEATH PINNACLE 7F 10CM (SHEATH) ×3 IMPLANT
STENT EXPRESS LD 10X25X75 (Permanent Stent) ×3 IMPLANT
STENT ICAST 10X38X80 (Permanent Stent) ×3 IMPLANT
STOPCOCK MORSE 400PSI 3WAY (MISCELLANEOUS) ×3 IMPLANT
SYRINGE MEDRAD AVANTA MACH 7 (SYRINGE) ×3 IMPLANT
TRANSDUCER W/STOPCOCK (MISCELLANEOUS) ×3 IMPLANT
TRAY PV CATH (CUSTOM PROCEDURE TRAY) ×3 IMPLANT
TUBING CIL FLEX 10 FLL-RA (TUBING) ×3 IMPLANT
WIRE HITORQ VERSACORE ST 145CM (WIRE) ×6 IMPLANT
WIRE MINI STICK MAX (SHEATH) ×3 IMPLANT

## 2016-06-25 NOTE — Interval H&P Note (Signed)
History and Physical Interval Note:  06/25/2016 1:55 PM  Brandon Parks  has presented today for surgery, with the diagnosis of PAD  The various methods of treatment have been discussed with the patient and family. After consideration of risks, benefits and other options for treatment, the patient has consented to  Procedure(s): Abdominal Aortogram (N/A) Lower Extremity Angiography (N/A) as a surgical intervention .  The patient's history has been reviewed, patient examined, no change in status, stable for surgery.  I have reviewed the patient's chart and labs.  Questions were answered to the patient's satisfaction.     Lorine BearsMuhammad Jessamyn Watterson

## 2016-06-25 NOTE — Discharge Instructions (Signed)
Femoral Site Care °Refer to this sheet in the next few weeks. These instructions provide you with information about caring for yourself after your procedure. Your health care provider may also give you more specific instructions. Your treatment has been planned according to current medical practices, but problems sometimes occur. Call your health care provider if you have any problems or questions after your procedure. °What can I expect after the procedure? °After your procedure, it is typical to have the following: °· Bruising at the site that usually fades within 1-2 weeks. °· Blood collecting in the tissue (hematoma) that may be painful to the touch. It should usually decrease in size and tenderness within 1-2 weeks. °Follow these instructions at home: °· Take medicines only as directed by your health care provider. °· You may shower 24-48 hours after the procedure or as directed by your health care provider. Remove the bandage (dressing) and gently wash the site with plain soap and water. Pat the area dry with a clean towel. Do not rub the site, because this may cause bleeding. °· Do not take baths, swim, or use a hot tub until your health care provider approves. °· Check your insertion site every day for redness, swelling, or drainage. °· Do not apply powder or lotion to the site. °· Limit use of stairs to twice a day for the first 2-3 days or as directed by your health care provider. °· Do not squat for the first 2-3 days or as directed by your health care provider. °· Do not lift over 10 lb (4.5 kg) for 5 days after your procedure or as directed by your health care provider. °· Ask your health care provider when it is okay to: °¨ Return to work or school. °¨ Resume usual physical activities or sports. °¨ Resume sexual activity. °· Do not drive home if you are discharged the same day as the procedure. Have someone else drive you. °· You may drive 24 hours after the procedure unless otherwise instructed by  your health care provider. °· Do not operate machinery or power tools for 24 hours after the procedure or as directed by your health care provider. °· If your procedure was done as an outpatient procedure, which means that you went home the same day as your procedure, a responsible adult should be with you for the first 24 hours after you arrive home. °· Keep all follow-up visits as directed by your health care provider. This is important. °Contact a health care provider if: °· You have a fever. °· You have chills. °· You have increased bleeding from the site. Hold pressure on the site. °Get help right away if: °· You have unusual pain at the site. °· You have redness, warmth, or swelling at the site. °· You have drainage (other than a small amount of blood on the dressing) from the site. °· The site is bleeding, and the bleeding does not stop after 30 minutes of holding steady pressure on the site. °· Your leg or foot becomes pale, cool, tingly, or numb. °This information is not intended to replace advice given to you by your health care provider. Make sure you discuss any questions you have with your health care provider. °Document Released: 12/09/2013 Document Revised: 09/13/2015 Document Reviewed: 10/25/2013 °Elsevier Interactive Patient Education © 2017 [employer] ° °

## 2016-06-25 NOTE — H&P (View-Only) (Signed)
Cardiology Office Note   Date:  06/20/2016   ID:  Brandon Parks, DOB 09/28/1980, MRN [id number]  PCP:  No PCP Per Patient  Cardiologist:   Lorine BearsMuhammad Arida, MD   Chief Complaint  Patient presents with  . OTHER    4 month f/u c/o right leg pain/numbness . Meds reviewed verbally with pt.      History of Present Illness: Brandon Parks is a 36 y.o. male who presents for  a follow-up visit of  peripheral arterial disease and coronary artery disease. He has known history of coronary artery disease status post CABG in July 2013 for ostial LAD plaque rupture with extensive thrombus and possible dissection , tobacco use and hyperlipidemia.  He was seen in 2016 for severe right leg claudication.  He underwent noninvasive evaluation which showed normal ABI bilaterally. 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 Angiography confirmed severeRight common iliac artery stenosis that was treated successfully with stent placement.  The patient developed recurrent claudication over the last 6 months with evidence of restenosis. He attempted a walking program with no improvement in symptoms. If anything, his claudication is getting worse and currently happening after walking only 20 feet. This has significantly affected his ability to perform his work. Unfortunately, he has not been able to quit smoking but was able to cut down from 2 packs per day to one pack per day. She denies any chest pain or shortness of breath.    Past Medical History:  Diagnosis Date  . CAD (coronary artery disease)   . Carotid artery occlusion   . GERD (gastroesophageal reflux disease)   . MI (myocardial infarction) 2013  . NSTEMI (non-ST elevated myocardial infarction) (HCC)   . Pneumothorax on left   . Tendinitis    bilateral    Past Surgical History:  Procedure Laterality Date  .  APPENDECTOMY    . CARDIAC CATHETERIZATION  2013   @ [facility]; [facility]  . CARDIAC CATHETERIZATION  Aug 2013   @ [facility]  . CORONARY ANGIOPLASTY  2013   @[email or web address]   . CORONARY ARTERY BYPASS GRAFT  2013   @ [facility]  . PERIPHERAL VASCULAR CATHETERIZATION N/A 02/28/2015   Procedure: Abdominal Aortogram;  Surgeon: Iran OuchMuhammad A Arida, MD;  [facility];  Service: Cardiovascular;  Laterality: N/A;     Current Outpatient Prescriptions  Medication Sig Dispense Refill  . aspirin 81 MG tablet Take 81 mg by mouth daily.    Marland Kitchen. atorvastatin (LIPITOR) 40 MG tablet Take 0.5 tablets (20 mg total) by mouth daily. 30 tablet 3  . nitroGLYCERIN (NITROSTAT) 0.4 MG SL tablet Place 1 tablet (0.4 mg total) under the tongue every 5 (five) minutes as needed. 25 tablet 6   No current facility-administered medications for this visit.     Allergies:   Patient has no known allergies.    Social History:  The patient  reports that he has been smoking Cigarettes.  He has a 7.00 pack-year smoking history. He has never used smokeless tobacco. He reports that he drinks about 1.2 oz of alcohol per week . He reports that he uses drugs, including Marijuana.   Family History:  The patient's Family history is unknown by patient.    ROS:  Please see the history of present illness.   Otherwise, review of systems are positive for none.   All  other systems are reviewed and negative.    PHYSICAL EXAM: VS:  BP 110/72 (BP Location: Left Arm, Patient Position: Sitting, Cuff Size: Normal)   Pulse 74   Ht 6\' 2"  (1.88 m)   Wt 261 lb 12 oz (118.7 kg)   BMI 33.61 kg/m  , BMI Body mass index is 33.61 kg/m. GEN: Well nourished, well developed, in no acute distress  HEENT: normal  Neck: no JVD, carotid bruits, or masses Cardiac: RRR; no murmurs, rubs, or gallops,no edema  Respiratory:  clear to auscultation bilaterally, normal work of breathing GI: soft, nontender, nondistended, + BS MS: no deformity or atrophy  Skin: warm and  dry, no rash Neuro:  Strength and sensation are intact Psych: euthymic mood, full affect Vascular: Femoral pulse is barely palpable on the right and mildly diminished on the left  EKG:  EKG is ordered today. The ekg ordered today demonstrates normal sinus rhythm with no significant ST or T wave changes.   Recent Labs: No results found for requested labs within last 8760 hours.    Lipid Panel No results found for: CHOL, TRIG, HDL, CHOLHDL, VLDL, LDLCALC, LDLDIRECT    Wt Readings from Last 3 Encounters:  06/20/16 261 lb 12 oz (118.7 kg)  12/27/15 258 lb 12 oz (117.4 kg)  03/30/15 265 lb 4 oz (120.3 kg)      No flowsheet data found.    ASSESSMENT AND PLAN:  1.  Peripheral arterial disease: Currently with severe lifestyle limiting right leg claudication (Rutherford class III). A failed a walking program and his symptoms have progressed significantly. Vascular studies today showed a drop in ABI to 0.84 on the right and 1.08 on the left. Duplex showed significant bilateral iliac artery disease worse on the right side. Due to severity of his symptoms, I recommend proceeding with angiography and possible endovascular intervention with the intention to use covered stents. I discussed the procedure in detail as well as risks than benefits. The patient might require intervention on the left iliac artery as well given the significant progression of stenosis over the last 6 months. I elected to add clopidogrel 75 mg once daily  2. Coronary artery disease involving native coronary arteries without angina: Continue medical therapy.  3. Hyperlipidemia: Continue treatment with atorvastatin  4. Tobacco use: I again discussed with him the importance of smoking cessation. He was able to decrease the amount to one pack per day but has not been able to quit completely.    Disposition:   FU with me in 1 months  Signed,  Lorine Bears, MD  06/20/2016 2:06 PM    [facility]

## 2016-06-26 ENCOUNTER — Encounter (HOSPITAL_COMMUNITY): Payer: Self-pay | Admitting: Cardiovascular Disease

## 2016-06-26 ENCOUNTER — Telehealth: Payer: Self-pay

## 2016-06-26 NOTE — Telephone Encounter (Signed)
Per Dr. Kirke CorinArida, pt needs ABI and LEA s/p bilateral iliac stents. Pt agreeable to March 30, 3pm.

## 2016-06-29 ENCOUNTER — Encounter: Payer: Self-pay | Admitting: Emergency Medicine

## 2016-06-29 ENCOUNTER — Emergency Department
Admission: EM | Admit: 2016-06-29 | Discharge: 2016-06-29 | Disposition: A | Discharge status: Home / Self Care | Payer: 59 | Attending: Emergency Medicine | Admitting: Emergency Medicine

## 2016-06-29 ENCOUNTER — Emergency Department: Payer: 59

## 2016-06-29 DIAGNOSIS — Z79899 Other long term (current) drug therapy: Secondary | ICD-10-CM | POA: Insufficient documentation

## 2016-06-29 DIAGNOSIS — I10 Essential (primary) hypertension: Secondary | ICD-10-CM | POA: Insufficient documentation

## 2016-06-29 DIAGNOSIS — I2510 Atherosclerotic heart disease of native coronary artery without angina pectoris: Secondary | ICD-10-CM | POA: Insufficient documentation

## 2016-06-29 DIAGNOSIS — R229 Localized swelling, mass and lump, unspecified: Secondary | ICD-10-CM

## 2016-06-29 DIAGNOSIS — F1290 Cannabis use, unspecified, uncomplicated: Secondary | ICD-10-CM | POA: Insufficient documentation

## 2016-06-29 DIAGNOSIS — T8089XA Other complications following infusion, transfusion and therapeutic injection, initial encounter: Secondary | ICD-10-CM | POA: Diagnosis not present

## 2016-06-29 DIAGNOSIS — G8918 Other acute postprocedural pain: Secondary | ICD-10-CM | POA: Diagnosis not present

## 2016-06-29 DIAGNOSIS — F17210 Nicotine dependence, cigarettes, uncomplicated: Secondary | ICD-10-CM | POA: Diagnosis not present

## 2016-06-29 DIAGNOSIS — Y828 Other medical devices associated with adverse incidents: Secondary | ICD-10-CM | POA: Diagnosis not present

## 2016-06-29 LAB — CBC WITH DIFFERENTIAL/PLATELET
BASOS PCT: 1 %
Basophils Absolute: 0 10*3/uL (ref 0–0.1)
EOS PCT: 3 %
Eosinophils Absolute: 0.3 10*3/uL (ref 0–0.7)
HCT: 48.4 % (ref 40.0–52.0)
HEMOGLOBIN: 16.5 g/dL (ref 13.0–18.0)
Lymphocytes Relative: 25 %
Lymphs Abs: 2.1 10*3/uL (ref 1.0–3.6)
MCH: 31.6 pg (ref 26.0–34.0)
MCHC: 34.2 g/dL (ref 32.0–36.0)
MCV: 92.5 fL (ref 80.0–100.0)
MONO ABS: 0.8 10*3/uL (ref 0.2–1.0)
MONOS PCT: 10 %
NEUTROS PCT: 61 %
Neutro Abs: 5.3 10*3/uL (ref 1.4–6.5)
PLATELETS: 182 10*3/uL (ref 150–440)
RBC: 5.23 MIL/uL (ref 4.40–5.90)
RDW: 12.1 % (ref 11.5–14.5)
WBC: 8.5 10*3/uL (ref 3.8–10.6)

## 2016-06-29 LAB — BASIC METABOLIC PANEL
Anion gap: 7 (ref 5–15)
BUN: 13 mg/dL (ref 6–20)
CALCIUM: 8.6 mg/dL — AB (ref 8.9–10.3)
CHLORIDE: 105 mmol/L (ref 101–111)
CO2: 25 mmol/L (ref 22–32)
CREATININE: 1.02 mg/dL (ref 0.61–1.24)
GFR calc non Af Amer: >60 mL/min (ref >60)
Glucose, Bld: 148 mg/dL — ABNORMAL HIGH (ref 65–99)
Potassium: 4.3 mmol/L (ref 3.5–5.1)
SODIUM: 137 mmol/L (ref 135–145)

## 2016-06-29 MED ORDER — TRAMADOL HCL 50 MG PO TABS
50.0000 mg | ORAL_TABLET | Freq: Once | ORAL | Status: AC
  Administered 2016-06-29: 50 mg via ORAL
  Filled 2016-06-29: qty 1

## 2016-06-29 MED ORDER — TRAMADOL HCL 50 MG PO TABS
50.0000 mg | ORAL_TABLET | Freq: Four times a day (QID) | ORAL | 0 refills | Status: DC | PRN
Start: 2016-06-29 — End: 2016-08-19

## 2016-06-29 NOTE — Discharge Instructions (Signed)
Dr. Kirke CorinArida advised for you to call first thing in the morning for follow-up in his office so they can perform office study to make sure that the fistula heals on its own. He is continue with your current medications at home.  Please return immediately if condition worsens. Please contact her primary physician or the physician you were given for referral. If you have any specialist physicians involved in her treatment and plan please also contact them. Thank you for using [facility].

## 2016-06-29 NOTE — ED Triage Notes (Signed)
Pt presents to ED via POV with c/o post op problems. Pt states had bilateral femoral stents placed on Wednesday. Pt states has had increasing pain to the L side, pt also states has had knots "the size of a marble" forming around either surgical site, worse today. Pt denies significant bruising at this time.

## 2016-06-29 NOTE — ED Notes (Signed)
This RN spoke with Dr. Shaune PollackLord, per Dr. Shaune PollackLord, no orders at this time. Pt states is taking Plavix for his new stents.

## 2016-06-29 NOTE — ED Provider Notes (Signed)
Time Seen: Approximately 1516  I have reviewed the triage notes  Chief Complaint: Post-op Problem   History of Present Illness: Brandon Parks is a 36 y.o. male *who had recent stent placement in the femoral arteries. Patient states he's had a previous stent in the past but is noticed on this particular occasion that he said increased difficulty with pain especially in the left groin area. He's felt swelling over this region. Denies any leg weakness. He states no nausea or vomiting. Patient arrives by private vehicle and [facility]. He is a long history of coronary artery disease and peripheral vascular disease.   Past Medical History:  Diagnosis Date  . CAD (coronary artery disease)   . Carotid artery occlusion   . GERD (gastroesophageal reflux disease)   . MI (myocardial infarction) 2013  . NSTEMI (non-ST elevated myocardial infarction) (HCC)   . Pneumothorax on left   . Tendinitis    bilateral    Patient Active Problem List   Diagnosis Date Noted  . PAD (peripheral artery disease) (HCC) 02/26/2015  . Tobacco use 02/26/2015  . Sternum pain 12/30/2012  . Hyperlipidemia 06/11/2012  . Essential hypertension 06/11/2012  . CAD (coronary artery disease) 11/28/2011  . 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 ABDOMINAL AORTOGRAM N/A 06/25/2016   Procedure: Abdominal Aortogram;  Surgeon: Iran OuchMuhammad A Arida, MD;  [facility];  Service: Cardiovascular;  Laterality: N/A;  . APPENDECTOMY    . CARDIAC CATHETERIZATION  2013   @ [facility]; [place]  . CARDIAC CATHETERIZATION  Aug 2013   @ [facility]  . CORONARY ANGIOPLASTY  2013   @[facility]   . CORONARY ARTERY BYPASS GRAFT  2013   @ [facility]  . LOWER EXTREMITY ANGIOGRAPHY N/A 06/25/2016   Procedure: Lower Extremity Angiography;  Surgeon: Iran OuchMuhammad A Arida, MD;  [facility];  Service: Cardiovascular;  Laterality: N/A;  Limited study  .  PERIPHERAL VASCULAR CATHETERIZATION N/A 02/28/2015   Procedure: Abdominal Aortogram;  Surgeon: Iran OuchMuhammad A Arida, MD;  [facility];  Service: Cardiovascular;  Laterality: N/A;  . PERIPHERAL VASCULAR INTERVENTION Bilateral 06/25/2016   Procedure: Peripheral Vascular Intervention;  Surgeon: Iran OuchMuhammad A Arida, MD;  [facility];  Service: Cardiovascular;  Laterality: Bilateral;  Bilateral Kissing Iliac stents    Past Surgical History:  Procedure Laterality Date  . ABDOMINAL AORTOGRAM N/A 06/25/2016   Procedure: Abdominal Aortogram;  Surgeon: Iran OuchMuhammad A Arida, MD;  [facility];  Service: Cardiovascular;  Laterality: N/A;  . APPENDECTOMY    . CARDIAC CATHETERIZATION  2013   @ [facility]; [place]  . CARDIAC CATHETERIZATION  Aug 2013   @ [facility]  . CORONARY ANGIOPLASTY  2013   @[email or web address]   . CORONARY ARTERY BYPASS GRAFT  2013   @ [facility]  . LOWER EXTREMITY ANGIOGRAPHY N/A 06/25/2016   Procedure: Lower Extremity Angiography;  Surgeon: Iran OuchMuhammad A Arida, MD;  [facility];  Service: Cardiovascular;  Laterality: N/A;  Limited study  . PERIPHERAL VASCULAR CATHETERIZATION N/A 02/28/2015   Procedure: Abdominal Aortogram;  Surgeon: Iran OuchMuhammad A Arida, MD;  [facility];  Service: Cardiovascular;  Laterality: N/A;  . PERIPHERAL VASCULAR INTERVENTION Bilateral 06/25/2016   Procedure: Peripheral Vascular Intervention;  Surgeon: Iran OuchMuhammad A Arida, MD;  [facility];  Service: Cardiovascular;  Laterality: Bilateral;  Bilateral Kissing Iliac stents  Current Outpatient Rx  . Order #: [id number] Class: Normal  . Order #: [id number] Class: Historical Med  . Order #: [id number] Class: Historical Med  . Order #: [id number] Class: Normal  . Order #: [id number] Class: Historical Med  . Order #: [id number] Class: Historical Med    Allergies:  Patient has no known allergies.  Family History: Family History  Problem Relation Age of Onset  . Family  history unknown: Yes    Social History: Social History  Substance Use Topics  . Smoking status: Current Every Day Smoker    Packs/day: 1.00    Years: 7.00    Types: Cigarettes    Last attempt to quit: 11/09/2011  . Smokeless tobacco: Never Used  . Alcohol use 1.2 oz/week    2 Cans of beer per week     Comment: ocassionally     Review of Systems:   10 point review of systems was performed and was otherwise negative:  Constitutional: No fever Eyes: No visual disturbances ENT: No sore throat, ear pain Cardiac: No chest pain Respiratory: No shortness of breath, wheezing, or stridor Abdomen: No abdominal pain, no vomiting, No diarrhea Endocrine: No weight loss, No night sweats Extremities: No peripheral edema, cyanosis Skin: No rashes, easy bruising Neurologic: No focal weakness, trouble with speech or swollowing Urologic: No dysuria, Hematuria, or urinary frequency   Physical Exam:  ED Triage Vitals [06/29/16 1206]  Enc Vitals Group     BP 120/87     Pulse Rate 76     Resp 16     Temp 98.3 F (36.8 C)     Temp Source Oral     SpO2 98 %     Weight 261 lb (118.4 kg)     Height 6\' 2"  (1.88 m)     Head Circumference      Peak Flow      Pain Score 7     Pain Loc      Pain Edu?      Excl. in GC?     General: Awake , Alert , and Oriented times 3; GCS 15 Head: Normal cephalic , atraumatic Eyes: Pupils equal , round, reactive to light Nose/Throat: No nasal drainage, patent upper airway without erythema or exudate.  Neck: Supple, Full range of motion, No anterior adenopathy or palpable thyroid masses Lungs: Clear to ascultation without wheezes , rhonchi, or rales Heart: Regular rate, regular rhythm without murmurs , gallops , or rubs Abdomen: Soft, non tender without rebound, guarding , or rigidity; bowel sounds positive and symmetric in all 4 quadrants. No organomegaly .        Extremities:Palpation of the right groin area shows a oval-shaped induration located over  the femoral artery region with no pulsatile abnormalities. Right lower extremity has good 2+ dorsalis and posterior tibial pulses. Close examination of the left groin shows a does have a palpable pulse over the left groin area with some induration. Again neurovascularly intact in the left lower extremity. Neurologic: normal ambulation, Motor symmetric without deficits, sensory intact Skin: warm, dry, no rashes   Labs:   All laboratory work was reviewed including any pertinent negatives or positives listed below:  Labs Reviewed  CBC WITH DIFFERENTIAL/PLATELET  BASIC METABOLIC PANEL  Laboratory work was reviewed and showed no clinically significant abnormalities.   Radiology: "[place] Lower Ext Art Bilat  Result Date: 06/29/2016 CLINICAL DATA:  Palpable abnormalities in each groin after bilateral common femoral artery access for a procedure on 06/25/2016 EXAM: BILATERAL LOWER EXTREMITY ARTERIAL DUPLEX SCAN  TECHNIQUE: Gray-scale sonography as well as color Doppler and duplex ultrasound was performed to evaluate the arteries of both lower extremities. COMPARISON:  None. FINDINGS: Focused ultrasound exam in the area of patient concern at the right groin reveals heterogeneous material within the superficial soft tissues. Color and power Doppler assessment reveals no abnormal flow to suggest fistula or pseudoaneurysm. The irregular echogenicity within the soft tissues may represent a groin hematoma. Focused ultrasound exam in the left groin reveals on color and power Doppler assessment a communication between the common femoral artery in the common femoral vein, consistent with fistula. The sonographer was unable to identify/demonstrate a discrete pseudoaneurysm. IMPRESSION: 1. No flow abnormality in the right groin with palpable abnormality likely representing groin hematoma. 2. AV fistula identified left groin with communication between the common femoral artery and vein on Doppler ultrasound. Electronically  Signed   By: Kennith Center M.D.   On: 06/29/2016 17:31  "  I personally reviewed the radiologic studies    ED Course:  Patient's stay here was uneventful. He was given Ultram for pain and also be discharged on the same. Appears he has a small fistula located in his left groin area which likely explains his pain. The patient's case was reviewed with his surgeon Dr. Kirke Corin. The ultrasound results reviewed and the patient needs to follow up in his office for further assessment. Patient's results were explained to him and the necessity for follow-up and he should not return to work until he is been to the office and discussed his case with his vascular surgeon.     Assessment:  Arteriovenous fistula left femoral region Status post vascular stent     Plan:  Outpatient Patient was advised to return immediately if condition worsens. Patient was advised to follow up with their primary care physician or other specialized physicians involved in their outpatient care. The patient and/or family member/power of attorney had laboratory results reviewed at the bedside. All questions and concerns were addressed and appropriate discharge instructions were distributed by the nursing staff.             Jennye Moccasin, MD 06/29/16 [phone]

## 2016-06-30 ENCOUNTER — Ambulatory Visit (INDEPENDENT_AMBULATORY_CARE_PROVIDER_SITE_OTHER): Payer: 59 | Admitting: Cardiovascular Disease

## 2016-06-30 ENCOUNTER — Encounter: Payer: Self-pay | Admitting: Cardiovascular Disease

## 2016-06-30 VITALS — BP 110/74 | HR 73 | Ht 74.0 in | Wt 260.5 lb

## 2016-06-30 DIAGNOSIS — I739 Peripheral vascular disease, unspecified: Secondary | ICD-10-CM

## 2016-06-30 DIAGNOSIS — E785 Hyperlipidemia, unspecified: Secondary | ICD-10-CM | POA: Diagnosis not present

## 2016-06-30 DIAGNOSIS — Z720 Tobacco use: Secondary | ICD-10-CM | POA: Diagnosis not present

## 2016-06-30 DIAGNOSIS — I10 Essential (primary) hypertension: Secondary | ICD-10-CM | POA: Diagnosis not present

## 2016-06-30 DIAGNOSIS — I2510 Atherosclerotic heart disease of native coronary artery without angina pectoris: Secondary | ICD-10-CM

## 2016-06-30 NOTE — Patient Instructions (Addendum)
Medication Instructions:  Your physician recommends that you continue on your current medications as directed. Please refer to the Current Medication list given to you today.   Labwork: Lipid and liver profile next week. Nothing to eat or drink after midnight the evening before your labs.   Testing/Procedures: Your physician has requested that you have an ankle brachial index (ABI) and aorta iliacs next week. During this test an ultrasound and blood pressure cuff are used to evaluate the arteries that supply the arms and legs with blood. Allow thirty minutes for this exam. There are no restrictions or special instructions.  We will call you with an appointment date and time.   Follow-Up: Keep April 10 follow up appointment with Dr. Kirke CorinArida.   Any Other Special Instructions Will Be Listed Below (If Applicable). You may return to work on Monday, March 19.      If you need a refill on your cardiac medications before your next appointment, please call your pharmacy.

## 2016-06-30 NOTE — Progress Notes (Signed)
Cardiology Office Note   Date:  06/30/2016   ID:  Brandon Parks, DOB [birth year], MRN [id number]  PCP:  No PCP Per Patient  Cardiologist:   Lorine Bears, MD   Chief Complaint  Patient presents with  . other    Follow up from cardiac cath and stent placement. The pt. was at [facility] ER yesterday with pain in left groin. Meds reviewed by the pt. verbally.       History of Present Illness: Brandon Parks is a 36 y.o. male who presents for  a follow-up visit of  peripheral arterial disease and coronary artery disease. He has known history of coronary artery disease status post CABG in July 2013 for ostial LAD plaque rupture with extensive thrombus and possible dissection , tobacco use and hyperlipidemia.  He was seen in 2016 for severe right leg claudication due to severe right common iliac artery stenosis which was treated successfully with stent placement at that time. Unfortunately, he developed recurrent claudication due to severe in-stent restenosis. Duplex also showed progression of left common iliac artery stenosis. He underwent bilateral common iliac artery kissing stent placement last week. A covered stent was used on the right side. Mynx closure device was used bilaterally for femoral hemostasis. The patient developed bilateral groin pain worse on the left side. He went to the emergency room over the weekend. Ultrasound was done which showed no evidence of aneurysm but there was suggestion of left femoral artery AV fistula.   Past Medical History:  Diagnosis Date  . CAD (coronary artery disease)   . Carotid artery occlusion   . GERD (gastroesophageal reflux disease)   . MI (myocardial infarction) 2013  . NSTEMI (non-ST elevated myocardial infarction) (HCC)   . Pneumothorax on left   . Tendinitis    bilateral    Past Surgical History:  Procedure Laterality Date  . ABDOMINAL AORTOGRAM N/A 06/25/2016   Procedure: Abdominal Aortogram;  Surgeon: Iran Ouch, MD;  [facility];  Service: Cardiovascular;  Laterality: N/A;  . APPENDECTOMY    . CARDIAC CATHETERIZATION  2013   @ [facility]; [facility]  . CARDIAC CATHETERIZATION  Aug 2013   @ [facility]  . CORONARY ANGIOPLASTY  2013   @[facility]   . CORONARY ARTERY BYPASS GRAFT  2013   @ [facility]  . LOWER EXTREMITY ANGIOGRAPHY N/A 06/25/2016   Procedure: Lower Extremity Angiography;  Surgeon: Iran Ouch, MD;  [facility];  Service: Cardiovascular;  Laterality: N/A;  Limited study  . PERIPHERAL VASCULAR CATHETERIZATION N/A 02/28/2015   Procedure: Abdominal Aortogram;  Surgeon: Iran Ouch, MD;  [facility];  Service: Cardiovascular;  Laterality: N/A;  . PERIPHERAL VASCULAR INTERVENTION Bilateral 06/25/2016   Procedure: Peripheral Vascular Intervention;  Surgeon: Iran Ouch, MD;  [facility];  Service: Cardiovascular;  Laterality: Bilateral;  Bilateral Kissing Iliac stents     Current Outpatient Prescriptions  Medication Sig Dispense Refill  . clopidogrel (PLAVIX) 75 MG tablet Take 1 tablet (75 mg total) by mouth daily. 30 tablet 5  . MAGNESIUM PO Take 1 tablet by mouth daily.    . Menaquinone-7 (VITAMIN K2 PO) Take 1 tablet by mouth daily.    . nitroGLYCERIN (NITROSTAT) 0.4 MG SL tablet Place 1 tablet (0.4 mg total) under the tongue every 5 (five) minutes as needed. 25 tablet 6  . OVER THE COUNTER MEDICATION Take 1 capsule by mouth daily. Serra Peptase    .  Pomegranate, Punica granatum, (POMEGRANATE PO) Take 1 tablet by mouth 2 (two) times daily.    . traMADol (ULTRAM) 50 MG tablet Take 1 tablet (50 mg total) by mouth every 6 (six) hours as needed. 20 tablet 0   No current facility-administered medications for this visit.     Allergies:   Patient has no known allergies.    Social History:  The patient  reports that he has been smoking Cigarettes.  He has a 1.75 pack-year smoking history. He has never used smokeless tobacco. He reports that he drinks  about 1.2 oz of alcohol per week . He reports that he uses drugs, including Marijuana.   Family History:  The patient's Family history is unknown by patient.    ROS:  Please see the history of present illness.   Otherwise, review of systems are positive for none.   All other systems are reviewed and negative.    PHYSICAL EXAM: VS:  BP 110/74 (BP Location: Left Arm, Patient Position: Sitting, Cuff Size: Normal)   Pulse 73   Ht 6\' 2"  (1.88 m)   Wt 260 lb 8 oz (118.2 kg)   BMI 33.45 kg/m  , BMI Body mass index is 33.45 kg/m. GEN: Well nourished, well developed, in no acute distress  HEENT: normal  Neck: no JVD, carotid bruits, or masses Cardiac: RRR; no murmurs, rubs, or gallops,no edema  Respiratory:  clear to auscultation bilaterally, normal work of breathing GI: soft, nontender, nondistended, + BS MS: no deformity or atrophy  Skin: warm and dry, no rash Neuro:  Strength and sensation are intact Psych: euthymic mood, full affect Vascular: Femoral pulse normal bilaterally with small scar tissue likely related to the closure device. No bruits are heard. Distal pulses are palpable.  EKG:  EKG is ordered today. The ekg ordered today demonstrates normal sinus rhythm with no significant ST or T wave changes.   Recent Labs: 06/29/2016: BUN 13; Creatinine, Ser 1.02; Hemoglobin 16.5; Platelets 182; Potassium 4.3; Sodium 137    Lipid Panel No results found for: CHOL, TRIG, HDL, CHOLHDL, VLDL, LDLCALC, LDLDIRECT    Wt Readings from Last 3 Encounters:  06/30/16 260 lb 8 oz (118.2 kg)  06/29/16 261 lb (118.4 kg)  06/25/16 261 lb (118.4 kg)      No flowsheet data found.    ASSESSMENT AND PLAN:  1.  Peripheral arterial disease:  Status post successful kissing stent placement to the bilateral common iliac arteries with excellent results. I suspect that the groin pain is likely due to the closure device as I don't see evidence of hematoma. I doubt that he has AV fistula in the left  side as there is no bruit in that area. I'm going to obtain vascular studies including ABI, aortoiliac duplex with some focus on the left groin. I will extend off work for another week.  2. Coronary artery disease involving native coronary arteries without angina: Continue medical therapy.  3. Hyperlipidemia: He was supposed to be on atorvastatin but he has been only using natural supplements. I'm going to check fasting lipid and liver profile with intervention to resume treatment with a statin.  4. Tobacco use: I again discussed with him the importance of smoking cessation.   Disposition:   FU with me in 1 months  Signed,  Lorine BearsMuhammad Ericca Labra, MD  06/30/2016 11:42 AM    [facility]

## 2016-07-03 ENCOUNTER — Ambulatory Visit: Payer: 59 | Admitting: Cardiovascular Disease

## 2016-07-03 ENCOUNTER — Telehealth: Payer: Self-pay | Admitting: Cardiovascular Disease

## 2016-07-03 NOTE — Telephone Encounter (Signed)
S/w wife who requests I call pt on cell phone as he is at work. S/w pt  who had March 7 lower extremity angiography w/PV intervention.  He went to the ER March 11 d/t pain from the stent placement.  While in the ED, pt had US bilateral lower extremities, prescribed tramadol and discharged.  He followed up with Dr. Kirke CorinArida March 12; aorta iliac duplex and ABI scheduled March 23. Pt called today to report bruise is still on his left leg. Reports it is  4" below the incision site and continues 10" down his leg. Bilateral lower extremities are warm and normal color. He can feel a pulse in his feet. Describes pain as more of "discomfort". Bruise is blue with yellow around outer edges. He is able to work today; this has not affected his ability to walk.  Advised pt to ice and elevate the area when possible, trace the outside of the bruise to better identify if bruise gets bigger and report worsening symptoms to us. He verbalized understanding and is agreeable w/plan.

## 2016-07-03 NOTE — Telephone Encounter (Signed)
Pt calling stating the Puncture site on leg There's a bruise that is starting to form, running down leg away from the area  Noticed it a day ago but it has been getting bigger.  Would like to know what to do about this Please advise

## 2016-07-11 ENCOUNTER — Ambulatory Visit: Payer: 59

## 2016-07-11 DIAGNOSIS — I739 Peripheral vascular disease, unspecified: Secondary | ICD-10-CM

## 2016-07-14 ENCOUNTER — Other Ambulatory Visit: Payer: Self-pay

## 2016-07-14 DIAGNOSIS — I739 Peripheral vascular disease, unspecified: Secondary | ICD-10-CM

## 2016-07-29 ENCOUNTER — Ambulatory Visit: Payer: 59 | Admitting: Cardiovascular Disease

## 2016-07-31 ENCOUNTER — Encounter: Payer: Self-pay | Admitting: Cardiovascular Disease

## 2016-08-08 ENCOUNTER — Telehealth: Payer: Self-pay | Admitting: Cardiovascular Disease

## 2016-08-08 NOTE — Telephone Encounter (Addendum)
Pt did not show for labs or f/u OV. Pt would like to reschedule. Scheduled May 1

## 2016-08-12 ENCOUNTER — Other Ambulatory Visit (INDEPENDENT_AMBULATORY_CARE_PROVIDER_SITE_OTHER): Payer: 59 | Admitting: *Deleted

## 2016-08-12 DIAGNOSIS — E785 Hyperlipidemia, unspecified: Secondary | ICD-10-CM

## 2016-08-13 ENCOUNTER — Other Ambulatory Visit: Payer: Self-pay

## 2016-08-13 DIAGNOSIS — E785 Hyperlipidemia, unspecified: Secondary | ICD-10-CM

## 2016-08-13 LAB — HEPATIC FUNCTION PANEL
ALBUMIN: 4.3 g/dL (ref 3.5–5.5)
ALK PHOS: 82 IU/L (ref 39–117)
ALT: 23 IU/L (ref 0–44)
AST: 14 IU/L (ref 0–40)
BILIRUBIN TOTAL: 0.6 mg/dL (ref 0.0–1.2)
Bilirubin, Direct: 0.17 mg/dL (ref 0.00–0.40)
TOTAL PROTEIN: 7.1 g/dL (ref 6.0–8.5)

## 2016-08-13 LAB — LIPID PANEL
Chol/HDL Ratio: 8.3 ratio — ABNORMAL HIGH (ref 0.0–5.0)
Cholesterol, Total: 208 mg/dL — ABNORMAL HIGH (ref 100–199)
HDL: 25 mg/dL — ABNORMAL LOW (ref >39)
LDL Calculated: 159 mg/dL — ABNORMAL HIGH (ref 0–99)
Triglycerides: 118 mg/dL (ref 0–149)
VLDL CHOLESTEROL CAL: 24 mg/dL (ref 5–40)

## 2016-08-13 MED ORDER — ATORVASTATIN CALCIUM 40 MG PO TABS: 40.0000 mg | ORAL_TABLET | Freq: Every day | ORAL | 3 refills | Status: DC

## 2016-08-19 ENCOUNTER — Encounter: Payer: Self-pay | Admitting: Cardiovascular Disease

## 2016-08-19 ENCOUNTER — Ambulatory Visit (INDEPENDENT_AMBULATORY_CARE_PROVIDER_SITE_OTHER): Payer: 59 | Admitting: Cardiovascular Disease

## 2016-08-19 ENCOUNTER — Other Ambulatory Visit: Payer: 59

## 2016-08-19 VITALS — BP 110/68 | HR 69 | Ht 74.0 in | Wt 256.5 lb

## 2016-08-19 DIAGNOSIS — Z720 Tobacco use: Secondary | ICD-10-CM

## 2016-08-19 DIAGNOSIS — E785 Hyperlipidemia, unspecified: Secondary | ICD-10-CM

## 2016-08-19 DIAGNOSIS — I2510 Atherosclerotic heart disease of native coronary artery without angina pectoris: Secondary | ICD-10-CM | POA: Diagnosis not present

## 2016-08-19 DIAGNOSIS — I739 Peripheral vascular disease, unspecified: Secondary | ICD-10-CM | POA: Diagnosis not present

## 2016-08-19 NOTE — Patient Instructions (Addendum)
Medication Instructions:  Your physician recommends that you continue on your current medications as directed. Please refer to the Current Medication list given to you today.   Labwork: Lipid and liver profile in 6 weeks at the [facility] outpatient lab. Nothing to eat or drink after midnight the evening before your labs.  Testing/Procedures: Aorta iliac duplex and ABI in September  Follow-Up: Your physician wants you to follow-up in: 6 months with Dr. Kirke Corin.  You will receive a reminder letter in the mail two months in advance. If you don't receive a letter, please call our office to schedule the follow-up appointment.   Any Other Special Instructions Will Be Listed Below (If Applicable).     If you need a refill on your cardiac medications before your next appointment, please call your pharmacy.

## 2016-08-19 NOTE — Progress Notes (Signed)
Cardiology Office Note   Date:  08/19/2016   ID:  Brandon Parks, DOB Nov 19, 1980, MRN [id number]  PCP:  No PCP Per Patient  Cardiologist:   Lorine Bears, MD   Chief Complaint  Patient presents with  . other     1 month f/u post ankle brachial index (ABI) and aorta iliacs. Pt states he is doing well. Reviewed meds with pt verbally.      History of Present Illness: Brandon Parks is a 36 y.o. male who presents for a follow-up visit of  peripheral arterial disease and coronary artery disease. He has known history of coronary artery disease status post CABG in July 2013 for ostial LAD plaque rupture with extensive thrombus and possible dissection , tobacco use and hyperlipidemia.  He was seen in 2016 for severe right leg claudication due to severe right common iliac artery stenosis which was treated successfully with stent placement at that time. He developed recurrent claudication due to severe in-stent restenosis. Duplex also showed progression of left common iliac artery stenosis. He underwent bilateral common iliac artery kissing stent placement in 06/2016. A covered stent was used on the right side.  Postprocedure ABI was normal with patent stents. He has been doing extremely well with no chest pain, shortness of breath or claudication. He was started on atorvastatin for hyperlipidemia. Unfortunately, he continues to smoke but trying to cut down on his own. He is down to 5 cigarettes a day.  Past Medical History:  Diagnosis Date  . CAD (coronary artery disease)   . Carotid artery occlusion   . GERD (gastroesophageal reflux disease)   . MI (myocardial infarction) (HCC) 2013  . NSTEMI (non-ST elevated myocardial infarction) (HCC)   . Pneumothorax on left   . Tendinitis    bilateral    Past Surgical History:  Procedure Laterality Date  . ABDOMINAL AORTOGRAM N/A 06/25/2016   Procedure: Abdominal Aortogram;  Surgeon: Iran Ouch, MD;  [facility];  Service:  Cardiovascular;  Laterality: N/A;  . APPENDECTOMY    . CARDIAC CATHETERIZATION  2013   @ [facility]; [facility]  . CARDIAC CATHETERIZATION  Aug 2013   @ [facility]  . CORONARY ANGIOPLASTY  2013     . CORONARY ARTERY BYPASS GRAFT  2013   @ [facility]  . LOWER EXTREMITY ANGIOGRAPHY N/A 06/25/2016   Procedure: Lower Extremity Angiography;  Surgeon: Iran Ouch, MD;  [facility];  Service: Cardiovascular;  Laterality: N/A;  Limited study  . PERIPHERAL VASCULAR CATHETERIZATION N/A 02/28/2015   Procedure: Abdominal Aortogram;  Surgeon: Iran Ouch, MD;  [facility];  Service: Cardiovascular;  Laterality: N/A;  . PERIPHERAL VASCULAR INTERVENTION Bilateral 06/25/2016   Procedure: Peripheral Vascular Intervention;  Surgeon: Iran Ouch, MD;  [facility];  Service: Cardiovascular;  Laterality: Bilateral;  Bilateral Kissing Iliac stents     Current Outpatient Prescriptions  Medication Sig Dispense Refill  . aspirin 81 MG chewable tablet Chew by mouth daily.    Marland Kitchen atorvastatin (LIPITOR) 40 MG tablet Take 1 tablet (40 mg total) by mouth daily. 90 tablet 3  . clopidogrel (PLAVIX) 75 MG tablet Take 1 tablet (75 mg total) by mouth daily. 30 tablet 5  . MAGNESIUM PO Take 1 tablet by mouth daily.    . Menaquinone-7 (VITAMIN K2 PO) Take 1 tablet by mouth daily.    . nitroGLYCERIN (NITROSTAT) 0.4 MG SL tablet Place 1 tablet (0.4 mg total)  under the tongue every 5 (five) minutes as needed. 25 tablet 6  . OVER THE COUNTER MEDICATION Take 1 capsule by mouth daily. Serra Peptase    . Pomegranate, Punica granatum, (POMEGRANATE PO) Take 1 tablet by mouth 2 (two) times daily.     No current facility-administered medications for this visit.     Allergies:   Patient has no known allergies.    Social History:  The patient  reports that he has been smoking Cigarettes.  He has a 1.75 pack-year smoking history. He has never used smokeless tobacco. He reports that he  drinks about 1.2 oz of alcohol per week . He reports that he uses drugs, including Marijuana.   Family History:  The patient's Family history is unknown by patient.    ROS:  Please see the history of present illness.   Otherwise, review of systems are positive for none.   All other systems are reviewed and negative.    PHYSICAL EXAM: VS:  BP 110/68 (BP Location: Left Arm, Patient Position: Sitting, Cuff Size: Normal)   Pulse 69   Ht  (1.88 m)   Wt 256 lb 8 oz (116.3 kg)   BMI 32.93 kg/m  , BMI Body mass index is 32.93 kg/m. GEN: Well nourished, well developed, in no acute distress  HEENT: normal  Neck: no JVD, carotid bruits, or masses Cardiac: RRR; no murmurs, rubs, or gallops,no edema  Respiratory:  clear to auscultation bilaterally, normal work of breathing GI: soft, nontender, nondistended, + BS MS: no deformity or atrophy  Skin: warm and dry, no rash Neuro:  Strength and sensation are intact Psych: euthymic mood, full affect Vascular: Femoral pulse normal bilaterally.  EKG:  EKG is ordered today. The ekg ordered today demonstrates normal sinus rhythm with no significant ST or T wave changes.   Recent Labs: 06/29/2016: BUN 13; Creatinine, Ser 1.02; Hemoglobin 16.5; Platelets 182; Potassium 4.3; Sodium 137 08/12/2016: ALT 23    Lipid Panel    Component Value Date/Time   CHOL 208 (H) 08/12/2016 0808   TRIG 118 08/12/2016 0808   HDL 25 (L) 08/12/2016 0808   CHOLHDL 8.3 (H) 08/12/2016 0808   LDLCALC 159 (H) 08/12/2016 0808      Wt Readings from Last 3 Encounters:  08/19/16 256 lb 8 oz (116.3 kg)  06/30/16 260 lb 8 oz (118.2 kg)  06/29/16 261 lb (118.4 kg)      No flowsheet data found.    ASSESSMENT AND PLAN:  1.  Peripheral arterial disease:  Status post  kissing stent placement to the bilateral common iliac arteries He is doing very well with no recurrent claudication. I am planning to continue dual antiplatelet therapy indefinitely as tolerated given  coronary artery disease and peripheral arterial disease. Repeat aortoiliac duplex an ABI in September.   2. Coronary artery disease involving native coronary arteries without angina: Continue medical therapy.  3. Hyperlipidemia: He was started on atorvastatin 40 mg once daily. I reviewed his most recent lipid profile before starting the medication which was not on target at all. Repeat lipid and liver profile in one month. I discussed with him the importance of healthy lifestyle changes.  4. Tobacco use: I again discussed with him the importance of smoking cessation.   Disposition:   FU with me in 6 months  Signed,  Lorine Bears, MD  08/19/2016 3:46 PM    [facility]

## 2016-10-27 ENCOUNTER — Telehealth: Payer: Self-pay | Admitting: Cardiovascular Disease

## 2016-10-27 NOTE — Telephone Encounter (Signed)
Pt past due for f/u lipid and liver profile as ordered at May 1 OV. S/w pt who is agreeable to [facility]. Offered to transfer pt to scheduling for September aorta iliac. Pt declined, stating he will call back to schedule

## 2016-12-19 ENCOUNTER — Telehealth: Payer: Self-pay | Admitting: Cardiovascular Disease

## 2016-12-19 NOTE — Telephone Encounter (Signed)
lmov to schedule 6 m fu ABI + Aorta  Needs October fu with Kirke CorinArida

## 2016-12-25 NOTE — Telephone Encounter (Signed)
Lmov for patient need ABI + Aorta

## 2016-12-26 ENCOUNTER — Other Ambulatory Visit
Admission: RE | Admit: 2016-12-26 | Discharge: 2016-12-26 | Disposition: A | Discharge status: Home / Self Care | Payer: 59 | Source: Ambulatory Visit | Attending: Cardiovascular Disease | Admitting: Cardiovascular Disease

## 2016-12-26 DIAGNOSIS — E785 Hyperlipidemia, unspecified: Secondary | ICD-10-CM | POA: Insufficient documentation

## 2016-12-26 LAB — LIPID PANEL
Cholesterol: 126 mg/dL (ref 0–200)
HDL: 22 mg/dL — AB (ref >40)
LDL CALC: 86 mg/dL (ref 0–99)
Total CHOL/HDL Ratio: 5.7 RATIO
Triglycerides: 91 mg/dL (ref <150)
VLDL: 18 mg/dL (ref 0–40)

## 2016-12-26 LAB — HEPATIC FUNCTION PANEL
ALT: 45 U/L (ref 17–63)
AST: 43 U/L — ABNORMAL HIGH (ref 15–41)
Albumin: 4.3 g/dL (ref 3.5–5.0)
Alkaline Phosphatase: 74 U/L (ref 38–126)
BILIRUBIN INDIRECT: 0.7 mg/dL (ref 0.3–0.9)
Bilirubin, Direct: 0.1 mg/dL (ref 0.1–0.5)
TOTAL PROTEIN: 7.2 g/dL (ref 6.5–8.1)
Total Bilirubin: 0.8 mg/dL (ref 0.3–1.2)

## 2017-01-14 ENCOUNTER — Ambulatory Visit: Payer: 59

## 2017-01-14 DIAGNOSIS — I739 Peripheral vascular disease, unspecified: Secondary | ICD-10-CM

## 2017-01-15 ENCOUNTER — Other Ambulatory Visit: Payer: Self-pay

## 2017-01-15 DIAGNOSIS — I739 Peripheral vascular disease, unspecified: Secondary | ICD-10-CM

## 2017-02-12 ENCOUNTER — Ambulatory Visit (INDEPENDENT_AMBULATORY_CARE_PROVIDER_SITE_OTHER): Payer: 59 | Admitting: Cardiovascular Disease

## 2017-02-12 ENCOUNTER — Encounter: Payer: Self-pay | Admitting: Cardiovascular Disease

## 2017-02-12 VITALS — BP 104/64 | HR 67 | Ht 74.0 in | Wt 246.2 lb

## 2017-02-12 DIAGNOSIS — E785 Hyperlipidemia, unspecified: Secondary | ICD-10-CM

## 2017-02-12 DIAGNOSIS — I2510 Atherosclerotic heart disease of native coronary artery without angina pectoris: Secondary | ICD-10-CM | POA: Diagnosis not present

## 2017-02-12 DIAGNOSIS — I739 Peripheral vascular disease, unspecified: Secondary | ICD-10-CM | POA: Diagnosis not present

## 2017-02-12 DIAGNOSIS — Z720 Tobacco use: Secondary | ICD-10-CM

## 2017-02-12 DIAGNOSIS — I10 Essential (primary) hypertension: Secondary | ICD-10-CM | POA: Diagnosis not present

## 2017-02-12 MED ORDER — CLOPIDOGREL BISULFATE 75 MG PO TABS: 75.0000 mg | ORAL_TABLET | Freq: Every day | ORAL | 3 refills | Status: DC

## 2017-02-12 MED ORDER — ATORVASTATIN CALCIUM 40 MG PO TABS: 40.0000 mg | ORAL_TABLET | Freq: Every day | ORAL | 3 refills | Status: DC

## 2017-02-12 NOTE — Progress Notes (Signed)
Cardiology Office Note   Date:  02/12/2017   ID:  Brandon Parks, DOB [birth year], MRN [id number]  PCP:  Patient, No Pcp Per  Cardiologist:   Lorine Bears, MD   Chief Complaint  Patient presents with  . other    6 month f/u no complaints today. Meds reviewed verbally with pt.      History of Present Illness: Brandon Parks is a 36 y.o. male who presents for a follow-up visit of  peripheral arterial disease and coronary artery disease. He has known history of coronary artery disease status post CABG in July 2013 for ostial LAD plaque rupture with extensive thrombus and possible dissection , tobacco use and hyperlipidemia.  He was seen in 2016 for severe right leg claudication due to severe right common iliac artery stenosis which was treated successfully with stent placement at that time. He developed recurrent claudication due to severe in-stent restenosis. Duplex also showed progression of left common iliac artery stenosis. He underwent bilateral common iliac artery kissing stent placement in 06/2016. A covered stent was used on the right side.  He has been doing very well with no chest pain, shortness of breath or claudication.  He has been taking his medications regularly.  He started going to the gym a few months ago feels better overall.  Unfortunately, he continues to smoke.  Past Medical History:  Diagnosis Date  . CAD (coronary artery disease)   . Carotid artery occlusion   . GERD (gastroesophageal reflux disease)   . MI (myocardial infarction) (HCC) 2013  . NSTEMI (non-ST elevated myocardial infarction) (HCC)   . Pneumothorax on left   . Tendinitis    bilateral    Past Surgical History:  Procedure Laterality Date  . ABDOMINAL AORTOGRAM N/A 06/25/2016   Procedure: Abdominal Aortogram;  Surgeon: Iran Ouch, MD;  [facility];  Service: Cardiovascular;  Laterality: N/A;  . APPENDECTOMY    . CARDIAC CATHETERIZATION  2013   @ [facility]; [facility]  .  CARDIAC CATHETERIZATION  Aug 2013   @ [facility]  . CORONARY ANGIOPLASTY  2013   @[facility]   . CORONARY ARTERY BYPASS GRAFT  2013   @ [facility]  . LOWER EXTREMITY ANGIOGRAPHY N/A 06/25/2016   Procedure: Lower Extremity Angiography;  Surgeon: Iran Ouch, MD;  [facility];  Service: Cardiovascular;  Laterality: N/A;  Limited study  . PERIPHERAL VASCULAR CATHETERIZATION N/A 02/28/2015   Procedure: Abdominal Aortogram;  Surgeon: Iran Ouch, MD;  [facility];  Service: Cardiovascular;  Laterality: N/A;  . PERIPHERAL VASCULAR INTERVENTION Bilateral 06/25/2016   Procedure: Peripheral Vascular Intervention;  Surgeon: Iran Ouch, MD;  [facility];  Service: Cardiovascular;  Laterality: Bilateral;  Bilateral Kissing Iliac stents     Current Outpatient Prescriptions  Medication Sig Dispense Refill  . atorvastatin (LIPITOR) 40 MG tablet Take 1 tablet (40 mg total) by mouth daily. 90 tablet 3  . clopidogrel (PLAVIX) 75 MG tablet Take 1 tablet (75 mg total) by mouth daily. 30 tablet 5  . MAGNESIUM PO Take 1 tablet by mouth daily.    . nitroGLYCERIN (NITROSTAT) 0.4 MG SL tablet Place 1 tablet (0.4 mg total) under the tongue every 5 (five) minutes as needed. 25 tablet 6   No current facility-administered medications for this visit.     Allergies:   Patient has no known allergies.    Social History:  The patient  reports that he has  been smoking Cigarettes.  He has a 1.75 pack-year smoking history. He has never used smokeless tobacco. He reports that he drinks about 1.2 oz of alcohol per week . He reports that he uses drugs, including Marijuana.   Family History:  The patient's Family history is unknown by patient.    ROS:  Please see the history of present illness.   Otherwise, review of systems are positive for none.   All other systems are reviewed and negative.    PHYSICAL EXAM: VS:  BP 104/64 (BP Location: Left Arm, Patient Position: Sitting,  Cuff Size: Normal)   Pulse 67   Ht 6\' 2"  (1.88 m)   Wt 246 lb 4 oz (111.7 kg)   BMI 31.62 kg/m  , BMI Body mass index is 31.62 kg/m. GEN: Well nourished, well developed, in no acute distress  HEENT: normal  Neck: no JVD, carotid bruits, or masses Cardiac: RRR; no murmurs, rubs, or gallops,no edema  Respiratory:  clear to auscultation bilaterally, normal work of breathing GI: soft, nontender, nondistended, + BS MS: no deformity or atrophy  Skin: warm and dry, no rash Neuro:  Strength and sensation are intact Psych: euthymic mood, full affect  EKG:  EKG is ordered today. The ekg ordered today demonstrates normal sinus rhythm with no significant ST or T wave changes.  Right axis deviation.   Recent Labs: 06/29/2016: BUN 13; Creatinine, Ser 1.02; Hemoglobin 16.5; Platelets 182; Potassium 4.3; Sodium 137 12/26/2016: ALT 45    Lipid Panel    Component Value Date/Time   CHOL 126 12/26/2016 0833   CHOL 208 (H) 08/12/2016 0808   TRIG 91 12/26/2016 0833   HDL 22 (L) 12/26/2016 0833   HDL 25 (L) 08/12/2016 0808   CHOLHDL 5.7 12/26/2016 0833   VLDL 18 12/26/2016 0833   LDLCALC 86 12/26/2016 0833   LDLCALC 159 (H) 08/12/2016 0808      Wt Readings from Last 3 Encounters:  02/12/17 246 lb 4 oz (111.7 kg)  08/19/16 256 lb 8 oz (116.3 kg)  06/30/16 260 lb 8 oz (118.2 kg)      No flowsheet data found.    ASSESSMENT AND PLAN:  1.  Peripheral arterial disease:  Status post  kissing stent placement to the bilateral common iliac arteries.  No recurrent claudication.  Most recent ABI was normal with patent stents.  Repeat studies in late March of next year.    2. Coronary artery disease involving native coronary arteries without angina: Continue medical therapy.  3. Hyperlipidemia: LDL was 159 which improved significantly with atorvastatin to an LDL of 86.  I am hoping that this will improve further with him being more active at the gym.  I will plan on repeating next year and if LDL  is above 70, we can consider switching to high-dose rosuvastatin and add Zetia.  4. Tobacco use: I again discussed with him the importance of smoking cessation.   Disposition:   FU with me in 6 months  Signed,  Lorine BearsMuhammad Donald Jacque, MD  02/12/2017 9:06 AM    [facility]

## 2017-02-12 NOTE — Patient Instructions (Signed)
Medication Instructions:  Your physician recommends that you continue on your current medications as directed. Please refer to the Current Medication list given to you today.   Labwork: none  Testing/Procedures: ABI and aorta iliac duplex due in March or April 2019.  Your physician has requested that you have an ankle brachial index (ABI). During this test an ultrasound and blood pressure cuff are used to evaluate the arteries that supply the arms and legs with blood. Allow thirty minutes for this exam. There are no restrictions or special instructions.  Your physician has requested that you have an aorta iliac duplex. During this test, an ultrasound is used to evaluate the aorta. Allow 30 minutes for this exam. Do not eat after midnight the day before and avoid carbonated beverages  Follow-Up: Your physician wants you to follow-up in: 6 months with Dr. Kirke CorinArida.  You will receive a reminder letter in the mail two months in advance. If you don't receive a letter, please call our office to schedule the follow-up appointment.   Any Other Special Instructions Will Be Listed Below (If Applicable).     If you need a refill on your cardiac medications before your next appointment, please call your pharmacy.

## 2017-08-04 ENCOUNTER — Ambulatory Visit (INDEPENDENT_AMBULATORY_CARE_PROVIDER_SITE_OTHER): Payer: 59

## 2017-08-04 ENCOUNTER — Other Ambulatory Visit: Payer: Self-pay | Admitting: Cardiovascular Disease

## 2017-08-04 DIAGNOSIS — I779 Disorder of arteries and arterioles, unspecified: Secondary | ICD-10-CM

## 2017-08-04 DIAGNOSIS — I739 Peripheral vascular disease, unspecified: Secondary | ICD-10-CM

## 2017-08-10 ENCOUNTER — Other Ambulatory Visit: Payer: Self-pay

## 2017-08-10 DIAGNOSIS — I739 Peripheral vascular disease, unspecified: Secondary | ICD-10-CM

## 2018-01-22 ENCOUNTER — Ambulatory Visit
Admission: RE | Admit: 2018-01-22 | Discharge: 2018-01-22 | Disposition: A | Discharge status: Home / Self Care | Payer: 59 | Source: Ambulatory Visit | Attending: Physician Assistant | Admitting: Physician Assistant

## 2018-01-22 ENCOUNTER — Other Ambulatory Visit (INDEPENDENT_AMBULATORY_CARE_PROVIDER_SITE_OTHER): Payer: Self-pay | Admitting: Physician Assistant

## 2018-01-22 DIAGNOSIS — R079 Chest pain, unspecified: Secondary | ICD-10-CM

## 2018-01-22 DIAGNOSIS — M25511 Pain in right shoulder: Secondary | ICD-10-CM

## 2018-01-22 DIAGNOSIS — M25512 Pain in left shoulder: Principal | ICD-10-CM

## 2018-01-22 DIAGNOSIS — M545 Low back pain, unspecified: Secondary | ICD-10-CM

## 2018-07-25 ENCOUNTER — Other Ambulatory Visit: Payer: Self-pay | Admitting: Cardiovascular Disease

## 2018-07-26 ENCOUNTER — Telehealth: Payer: Self-pay | Admitting: Cardiovascular Disease

## 2018-07-26 NOTE — Telephone Encounter (Signed)
Attempted to call the patient. I left a message for him to please call back.  

## 2018-07-26 NOTE — Telephone Encounter (Signed)
Call returned to the patient. He stated that for the past 6 months he has been having increased pain and fatigue in his right leg. He can now only walk 20-30 feet before he feels like his leg will give out. He denies any discoloration or temperature change but stated the other day when he was playing golf that his right leg went numb from his toes to his butt cheek.   He had stents placed in 2018 and is due for repeat duplexes this month. He has not been seen in the office since 2018. Message routed to the provider for recommendations on timeframe for the tests and follow up.

## 2018-07-26 NOTE — Telephone Encounter (Signed)
Patient returning call.

## 2018-07-26 NOTE — Telephone Encounter (Signed)
Patient calling  Patient states he can tell he is having issues with one of his stents - not feeling well Patient wants to come in for an ultrasound and then a visit Patient wants to know what provider thinks before scheduling an evisit  Please call to discuss

## 2018-07-27 NOTE — Telephone Encounter (Signed)
Schedule him for an aortoiliac duplex and ABI to be done in the next few weeks.

## 2018-07-28 ENCOUNTER — Telehealth: Payer: Self-pay

## 2018-07-28 NOTE — Telephone Encounter (Signed)
Opened in error

## 2018-07-28 NOTE — Telephone Encounter (Signed)
Call returned to the patient. He has been informed that the repeat dopplers should be done within the next 1-2 weeks. The patient would prefer as soon as possible. Message has been sent to scheduling.

## 2018-07-28 NOTE — Telephone Encounter (Signed)
Refill request for Plavix 75 mg.  Patient has not been seen since 2018.  Due to the Covid 19 virus, I sent in a refill for Plavix 75 mg with a 90 day supply and no refills.  I see Faustino Congress, RN is to schedule aortoiliac duplex and ABI & a follow up after test.

## 2018-07-30 ENCOUNTER — Other Ambulatory Visit: Payer: Self-pay | Admitting: Cardiovascular Disease

## 2018-07-30 DIAGNOSIS — I739 Peripheral vascular disease, unspecified: Secondary | ICD-10-CM

## 2018-08-05 ENCOUNTER — Ambulatory Visit (INDEPENDENT_AMBULATORY_CARE_PROVIDER_SITE_OTHER): Payer: 59

## 2018-08-05 ENCOUNTER — Telehealth: Payer: Self-pay | Admitting: *Deleted

## 2018-08-05 ENCOUNTER — Other Ambulatory Visit: Payer: Self-pay

## 2018-08-05 DIAGNOSIS — I739 Peripheral vascular disease, unspecified: Secondary | ICD-10-CM | POA: Diagnosis not present

## 2018-08-05 NOTE — Telephone Encounter (Signed)
Patient called and scheduled for an appointment on 08/12/2018 with Dr. Kirke Corin for abnormal duplex. The patient has verbalized his understanding.    COVID-19 Pre-Screening Questions:  . Do you currently have a fever? No  . Have you recently travelled on a cruise, internationally, or to [place], [place], [place], [place], [place], or [place], [place] [employer]) ? No . Have you been in contact with someone that is currently pending confirmation of Covid19 testing or has been confirmed to have the Covid19 virus?  No  . Are you currently experiencing fatigue or cough? No

## 2018-08-12 ENCOUNTER — Ambulatory Visit: Payer: 59 | Admitting: Cardiovascular Disease

## 2018-08-12 NOTE — Progress Notes (Deleted)
Cardiology Office Note   Date:  08/12/2018   ID:  Brandon Parks, DOB November 08, 1980, MRN [id number]  PCP:  Patient, No Pcp Per  Cardiologist:   Lorine Bears, MD   No chief complaint on file.     History of Present Illness: Brandon Parks is a 38 y.o. male who presents for a follow-up visit of  peripheral arterial disease and coronary artery disease. He has known history of coronary artery disease status post CABG in July 2013 for ostial LAD plaque rupture with extensive thrombus and possible dissection , tobacco use and hyperlipidemia.  He was seen in 2016 for severe right leg claudication due to severe right common iliac artery stenosis which was treated successfully with stent placement at that time. He developed recurrent claudication due to severe in-stent restenosis. Duplex also showed progression of left common iliac artery stenosis. He underwent bilateral common iliac artery kissing stent placement in 06/2016. A covered stent was used on the right side.  He has been doing very well with no chest pain, shortness of breath or claudication.  He has been taking his medications regularly.  He started going to the gym a few months ago feels better overall.  Unfortunately, he continues to smoke.  Past Medical History:  Diagnosis Date  . CAD (coronary artery disease)   . Carotid artery occlusion   . GERD (gastroesophageal reflux disease)   . MI (myocardial infarction) (HCC) 2013  . NSTEMI (non-ST elevated myocardial infarction) (HCC)   . Pneumothorax on left   . Tendinitis    bilateral    Past Surgical History:  Procedure Laterality Date  . ABDOMINAL AORTOGRAM N/A 06/25/2016   Procedure: Abdominal Aortogram;  Surgeon: Iran Ouch, MD;  [facility];  Service: Cardiovascular;  Laterality: N/A;  . APPENDECTOMY    . CARDIAC CATHETERIZATION  2013   @ [facility]; [facility]  . CARDIAC CATHETERIZATION  Aug 2013   @ [facility]  . CORONARY ANGIOPLASTY  2013   @[facility]   .  CORONARY ARTERY BYPASS GRAFT  2013   @ [facility]  . LOWER EXTREMITY ANGIOGRAPHY N/A 06/25/2016   Procedure: Lower Extremity Angiography;  Surgeon: Iran Ouch, MD;  [facility];  Service: Cardiovascular;  Laterality: N/A;  Limited study  . PERIPHERAL VASCULAR CATHETERIZATION N/A 02/28/2015   Procedure: Abdominal Aortogram;  Surgeon: Iran Ouch, MD;  [facility];  Service: Cardiovascular;  Laterality: N/A;  . PERIPHERAL VASCULAR INTERVENTION Bilateral 06/25/2016   Procedure: Peripheral Vascular Intervention;  Surgeon: Iran Ouch, MD;  [facility];  Service: Cardiovascular;  Laterality: Bilateral;  Bilateral Kissing Iliac stents     Current Outpatient Medications  Medication Sig Dispense Refill  . atorvastatin (LIPITOR) 40 MG tablet Take 1 tablet (40 mg total) by mouth daily. 90 tablet 3  . clopidogrel (PLAVIX) 75 MG tablet TAKE 1 TABLET BY MOUTH  DAILY 90 tablet 0  . MAGNESIUM PO Take 1 tablet by mouth daily.    . nitroGLYCERIN (NITROSTAT) 0.4 MG SL tablet Place 1 tablet (0.4 mg total) under the tongue every 5 (five) minutes as needed. 25 tablet 6   No current facility-administered medications for this visit.     Allergies:   Patient has no known allergies.    Social History:  The patient  reports that he has been smoking cigarettes. He has a 1.75 pack-year smoking history. He has never used smokeless tobacco. He reports current alcohol use  of about 2.0 standard drinks of alcohol per week. He reports current drug use. Drug: Marijuana.   Family History:  The patient's Family history is unknown by patient.    ROS:  Please see the history of present illness.   Otherwise, review of systems are positive for none.   All other systems are reviewed and negative.    PHYSICAL EXAM: VS:  There were no vitals taken for this visit. , BMI There is no height or weight on file to calculate BMI. GEN: Well nourished, well developed, in no acute  distress  HEENT: normal  Neck: no JVD, carotid bruits, or masses Cardiac: RRR; no murmurs, rubs, or gallops,no edema  Respiratory:  clear to auscultation bilaterally, normal work of breathing GI: soft, nontender, nondistended, + BS MS: no deformity or atrophy  Skin: warm and dry, no rash Neuro:  Strength and sensation are intact Psych: euthymic mood, full affect  EKG:  EKG is ordered today. The ekg ordered today demonstrates normal sinus rhythm with no significant ST or T wave changes.  Right axis deviation.   Recent Labs: No results found for requested labs within last 8760 hours.    Lipid Panel    Component Value Date/Time   CHOL 126 12/26/2016 0833   CHOL 208 (H) 08/12/2016 0808   TRIG 91 12/26/2016 0833   HDL 22 (L) 12/26/2016 0833   HDL 25 (L) 08/12/2016 0808   CHOLHDL 5.7 12/26/2016 0833   VLDL 18 12/26/2016 0833   LDLCALC 86 12/26/2016 0833   LDLCALC 159 (H) 08/12/2016 0808      Wt Readings from Last 3 Encounters:  02/12/17 246 lb 4 oz (111.7 kg)  08/19/16 256 lb 8 oz (116.3 kg)  06/30/16 260 lb 8 oz (118.2 kg)      No flowsheet data found.    ASSESSMENT AND PLAN:  1.  Peripheral arterial disease:  Status post  kissing stent placement to the bilateral common iliac arteries.  No recurrent claudication.  Most recent ABI was normal with patent stents.  Repeat studies in late March of next year.    2. Coronary artery disease involving native coronary arteries without angina: Continue medical therapy.  3. Hyperlipidemia: LDL was 159 which improved significantly with atorvastatin to an LDL of 86.  I am hoping that this will improve further with him being more active at the gym.  I will plan on repeating next year and if LDL is above 70, we can consider switching to high-dose rosuvastatin and add Zetia.  4. Tobacco use: I again discussed with him the importance of smoking cessation.   Disposition:   FU with me in 6 months  Signed,  Lorine BearsMuhammad , MD   08/12/2018 8:29 AM    [facility]

## 2018-08-17 ENCOUNTER — Encounter: Payer: Self-pay | Admitting: Cardiovascular Disease

## 2018-08-17 ENCOUNTER — Ambulatory Visit (INDEPENDENT_AMBULATORY_CARE_PROVIDER_SITE_OTHER): Payer: 59 | Admitting: Cardiovascular Disease

## 2018-08-17 ENCOUNTER — Other Ambulatory Visit: Payer: Self-pay

## 2018-08-17 VITALS — BP 110/72 | HR 63 | Temp 97.6°F | Ht 74.0 in | Wt 230.5 lb

## 2018-08-17 DIAGNOSIS — I2510 Atherosclerotic heart disease of native coronary artery without angina pectoris: Secondary | ICD-10-CM | POA: Diagnosis not present

## 2018-08-17 DIAGNOSIS — Z720 Tobacco use: Secondary | ICD-10-CM

## 2018-08-17 DIAGNOSIS — E785 Hyperlipidemia, unspecified: Secondary | ICD-10-CM

## 2018-08-17 DIAGNOSIS — I739 Peripheral vascular disease, unspecified: Secondary | ICD-10-CM

## 2018-08-17 DIAGNOSIS — Z01818 Encounter for other preprocedural examination: Secondary | ICD-10-CM | POA: Diagnosis not present

## 2018-08-17 MED ORDER — ROSUVASTATIN CALCIUM 40 MG PO TABS: 40.0000 mg | ORAL_TABLET | Freq: Every day | ORAL | 3 refills | Status: DC

## 2018-08-17 MED ORDER — ASPIRIN EC 81 MG PO TBEC: 81.0000 mg | DELAYED_RELEASE_TABLET | Freq: Every day | ORAL | 3 refills | Status: DC

## 2018-08-17 NOTE — H&P (View-Only) (Signed)
Cardiology Office Note   Date:  08/17/2018   ID:  Brandon Parks, DOB Aug 22, 1980, MRN [id number]  PCP:  Patient, No Pcp Per  Cardiologist:   Lorine Bears, MD   Chief Complaint  Patient presents with  . other    Follow up from ABI's. Meds reviewed by the pt. verbally.Pt. c/o right leg pain when walking.       History of Present Illness: Brandon Parks is a 38 y.o. male who presents for a follow-up visit of  peripheral arterial disease and coronary artery disease. He has known history of coronary artery disease status post CABG in July 2013 for ostial LAD plaque rupture with extensive thrombus and possible dissection , tobacco use and hyperlipidemia.  He was seen in 2016 for severe right leg claudication due to severe right common iliac artery stenosis which was treated successfully with stent placement at that time. He developed recurrent claudication due to severe in-stent restenosis. Duplex also showed progression of left common iliac artery stenosis. He underwent bilateral common iliac artery kissing stent placement in 06/2016. A covered stent was used on the right side.   I have not seen him since 2018 and he reports severe right leg claudication and early rest pain that started 3 to 4 months ago.  He can only walk 20 to 30 feet on flat level before he has to stop and can go up only 3 or 4 steps.  When he works for long hours and come back, the pain in the right leg persists even after sitting down and resting.   Recent vascular studies showed an interval occlusion of the right common iliac artery stent with significant restenosis on the left side  Unfortunately, he stopped taking atorvastatin and continues to smoke few cigarettes a day.  Past Medical History:  Diagnosis Date  . CAD (coronary artery disease)   . Carotid artery occlusion   . GERD (gastroesophageal reflux disease)   . MI (myocardial infarction) (HCC) 2013  . NSTEMI (non-ST elevated myocardial infarction) (HCC)    . Pneumothorax on left   . Tendinitis    bilateral    Past Surgical History:  Procedure Laterality Date  . ABDOMINAL AORTOGRAM N/A 06/25/2016   Procedure: Abdominal Aortogram;  Surgeon: Iran Ouch, MD;  [facility];  Service: Cardiovascular;  Laterality: N/A;  . APPENDECTOMY    . CARDIAC CATHETERIZATION  2013   @ [facility]; [facility]  . CARDIAC CATHETERIZATION  Aug 2013   @ [facility]  . CORONARY ANGIOPLASTY  2013   @[facility]   . CORONARY ARTERY BYPASS GRAFT  2013   @ [facility]  . LOWER EXTREMITY ANGIOGRAPHY N/A 06/25/2016   Procedure: Lower Extremity Angiography;  Surgeon: Iran Ouch, MD;  [facility];  Service: Cardiovascular;  Laterality: N/A;  Limited study  . PERIPHERAL VASCULAR CATHETERIZATION N/A 02/28/2015   Procedure: Abdominal Aortogram;  Surgeon: Iran Ouch, MD;  [facility];  Service: Cardiovascular;  Laterality: N/A;  . PERIPHERAL VASCULAR INTERVENTION Bilateral 06/25/2016   Procedure: Peripheral Vascular Intervention;  Surgeon: Iran Ouch, MD;  [facility];  Service: Cardiovascular;  Laterality: Bilateral;  Bilateral Kissing Iliac stents     Current Outpatient Medications  Medication Sig Dispense Refill  . clopidogrel (PLAVIX) 75 MG tablet TAKE 1 TABLET BY MOUTH  DAILY 90 tablet 0  . MAGNESIUM PO Take 1 tablet by mouth daily.    . nitroGLYCERIN (NITROSTAT) 0.4 MG  SL tablet Place 1 tablet (0.4 mg total) under the tongue every 5 (five) minutes as needed. 25 tablet 6   No current facility-administered medications for this visit.     Allergies:   Patient has no known allergies.    Social History:  The patient  reports that he has been smoking cigarettes. He has a 1.75 pack-year smoking history. He has never used smokeless tobacco. He reports current alcohol use of about 2.0 standard drinks of alcohol per week. He reports previous drug use. Drug: Marijuana.   Family History:  The patient's Family  history is unknown by patient.    ROS:  Please see the history of present illness.   Otherwise, review of systems are positive for none.   All other systems are reviewed and negative.    PHYSICAL EXAM: VS:  BP 110/72 (BP Location: Left Arm, Patient Position: Sitting, Cuff Size: Normal)   Pulse 63   Temp 97.6 F (36.4 C)   Ht 6\' 2"  (1.88 m)   Wt 230 lb 8 oz (104.6 kg)   SpO2 99%   BMI 29.59 kg/m  , BMI Body mass index is 29.59 kg/m. GEN: Well nourished, well developed, in no acute distress  HEENT: normal  Neck: no JVD, carotid bruits, or masses Cardiac: RRR; no murmurs, rubs, or gallops,no edema  Respiratory:  clear to auscultation bilaterally, normal work of breathing GI: soft, nontender, nondistended, + BS MS: no deformity or atrophy  Skin: warm and dry, no rash Neuro:  Strength and sensation are intact Psych: euthymic mood, full affect Vascular: Femoral pulses absent on the right side and mildly diminished on the left side.  EKG:  EKG is ordered today. The ekg ordered today demonstrates normal sinus rhythm with no significant ST or T wave changes.  Right axis deviation.   Recent Labs: No results found for requested labs within last 8760 hours.    Lipid Panel    Component Value Date/Time   CHOL 126 12/26/2016 0833   CHOL 208 (H) 08/12/2016 0808   TRIG 91 12/26/2016 0833   HDL 22 (L) 12/26/2016 0833   HDL 25 (L) 08/12/2016 0808   CHOLHDL 5.7 12/26/2016 0833   VLDL 18 12/26/2016 0833   LDLCALC 86 12/26/2016 0833   LDLCALC 159 (H) 08/12/2016 0808      Wt Readings from Last 3 Encounters:  08/17/18 230 lb 8 oz (104.6 kg)  02/12/17 246 lb 4 oz (111.7 kg)  08/19/16 256 lb 8 oz (116.3 kg)      No flowsheet data found.    ASSESSMENT AND PLAN:  1.  Peripheral arterial disease:  Status post  kissing stent placement to the bilateral common iliac arteries.  Now with severe right leg claudication and early rest pain due to an occluded right common iliac artery  stent.  There is also significant restenosis on the left side.  He is extremely limited by this.  I recommend proceeding with urgent abdominal aortogram with lower extremity runoff and possible endovascular intervention.  I discussed the procedure in details as well as risks and benefits.  Planned access is via the left common femoral artery.  I did discuss with him the possibility of needing aortobifemoral bypass.  2. Coronary artery disease involving native coronary arteries without angina: Continue medical therapy.  3. Hyperlipidemia: He stopped taking atorvastatin without discussing with us.  I elected to start rosuvastatin 40 mg daily.  4. Tobacco use: I again discussed with him the importance of smoking cessation.  Disposition:   FU with me in 1 months  Signed,  Lorine Bears, MD  08/17/2018 12:29 PM    [facility]

## 2018-08-17 NOTE — Progress Notes (Signed)
Cardiology Office Note   Date:  08/17/2018   ID:  Brandon Parks, DOB Aug 22, 1980, MRN [id number]  PCP:  Patient, No Pcp Per  Cardiologist:   Lorine Bears, MD   Chief Complaint  Patient presents with  . other    Follow up from ABI's. Meds reviewed by the pt. verbally.Pt. c/o right leg pain when walking.       History of Present Illness: Brandon Parks is a 38 y.o. male who presents for a follow-up visit of  peripheral arterial disease and coronary artery disease. He has known history of coronary artery disease status post CABG in July 2013 for ostial LAD plaque rupture with extensive thrombus and possible dissection , tobacco use and hyperlipidemia.  He was seen in 2016 for severe right leg claudication due to severe right common iliac artery stenosis which was treated successfully with stent placement at that time. He developed recurrent claudication due to severe in-stent restenosis. Duplex also showed progression of left common iliac artery stenosis. He underwent bilateral common iliac artery kissing stent placement in 06/2016. A covered stent was used on the right side.   I have not seen him since 2018 and he reports severe right leg claudication and early rest pain that started 3 to 4 months ago.  He can only walk 20 to 30 feet on flat level before he has to stop and can go up only 3 or 4 steps.  When he works for long hours and come back, the pain in the right leg persists even after sitting down and resting.   Recent vascular studies showed an interval occlusion of the right common iliac artery stent with significant restenosis on the left side  Unfortunately, he stopped taking atorvastatin and continues to smoke few cigarettes a day.  Past Medical History:  Diagnosis Date  . CAD (coronary artery disease)   . Carotid artery occlusion   . GERD (gastroesophageal reflux disease)   . MI (myocardial infarction) (HCC) 2013  . NSTEMI (non-ST elevated myocardial infarction) (HCC)    . Pneumothorax on left   . Tendinitis    bilateral    Past Surgical History:  Procedure Laterality Date  . ABDOMINAL AORTOGRAM N/A 06/25/2016   Procedure: Abdominal Aortogram;  Surgeon: Iran Ouch, MD;  [facility];  Service: Cardiovascular;  Laterality: N/A;  . APPENDECTOMY    . CARDIAC CATHETERIZATION  2013   @ [facility]; [facility]  . CARDIAC CATHETERIZATION  Aug 2013   @ [facility]  . CORONARY ANGIOPLASTY  2013   @[facility]   . CORONARY ARTERY BYPASS GRAFT  2013   @ [facility]  . LOWER EXTREMITY ANGIOGRAPHY N/A 06/25/2016   Procedure: Lower Extremity Angiography;  Surgeon: Iran Ouch, MD;  [facility];  Service: Cardiovascular;  Laterality: N/A;  Limited study  . PERIPHERAL VASCULAR CATHETERIZATION N/A 02/28/2015   Procedure: Abdominal Aortogram;  Surgeon: Iran Ouch, MD;  [facility];  Service: Cardiovascular;  Laterality: N/A;  . PERIPHERAL VASCULAR INTERVENTION Bilateral 06/25/2016   Procedure: Peripheral Vascular Intervention;  Surgeon: Iran Ouch, MD;  [facility];  Service: Cardiovascular;  Laterality: Bilateral;  Bilateral Kissing Iliac stents     Current Outpatient Medications  Medication Sig Dispense Refill  . clopidogrel (PLAVIX) 75 MG tablet TAKE 1 TABLET BY MOUTH  DAILY 90 tablet 0  . MAGNESIUM PO Take 1 tablet by mouth daily.    . nitroGLYCERIN (NITROSTAT) 0.4 MG  SL tablet Place 1 tablet (0.4 mg total) under the tongue every 5 (five) minutes as needed. 25 tablet 6   No current facility-administered medications for this visit.     Allergies:   Patient has no known allergies.    Social History:  The patient  reports that he has been smoking cigarettes. He has a 1.75 pack-year smoking history. He has never used smokeless tobacco. He reports current alcohol use of about 2.0 standard drinks of alcohol per week. He reports previous drug use. Drug: Marijuana.   Family History:  The patient's Family  history is unknown by patient.    ROS:  Please see the history of present illness.   Otherwise, review of systems are positive for none.   All other systems are reviewed and negative.    PHYSICAL EXAM: VS:  BP 110/72 (BP Location: Left Arm, Patient Position: Sitting, Cuff Size: Normal)   Pulse 63   Temp 97.6 F (36.4 C)   Ht 6\' 2"  (1.88 m)   Wt 230 lb 8 oz (104.6 kg)   SpO2 99%   BMI 29.59 kg/m  , BMI Body mass index is 29.59 kg/m. GEN: Well nourished, well developed, in no acute distress  HEENT: normal  Neck: no JVD, carotid bruits, or masses Cardiac: RRR; no murmurs, rubs, or gallops,no edema  Respiratory:  clear to auscultation bilaterally, normal work of breathing GI: soft, nontender, nondistended, + BS MS: no deformity or atrophy  Skin: warm and dry, no rash Neuro:  Strength and sensation are intact Psych: euthymic mood, full affect Vascular: Femoral pulses absent on the right side and mildly diminished on the left side.  EKG:  EKG is ordered today. The ekg ordered today demonstrates normal sinus rhythm with no significant ST or T wave changes.  Right axis deviation.   Recent Labs: No results found for requested labs within last 8760 hours.    Lipid Panel    Component Value Date/Time   CHOL 126 12/26/2016 0833   CHOL 208 (H) 08/12/2016 0808   TRIG 91 12/26/2016 0833   HDL 22 (L) 12/26/2016 0833   HDL 25 (L) 08/12/2016 0808   CHOLHDL 5.7 12/26/2016 0833   VLDL 18 12/26/2016 0833   LDLCALC 86 12/26/2016 0833   LDLCALC 159 (H) 08/12/2016 0808      Wt Readings from Last 3 Encounters:  08/17/18 230 lb 8 oz (104.6 kg)  02/12/17 246 lb 4 oz (111.7 kg)  08/19/16 256 lb 8 oz (116.3 kg)      No flowsheet data found.    ASSESSMENT AND PLAN:  1.  Peripheral arterial disease:  Status post  kissing stent placement to the bilateral common iliac arteries.  Now with severe right leg claudication and early rest pain due to an occluded right common iliac artery  stent.  There is also significant restenosis on the left side.  He is extremely limited by this.  I recommend proceeding with urgent abdominal aortogram with lower extremity runoff and possible endovascular intervention.  I discussed the procedure in details as well as risks and benefits.  Planned access is via the left common femoral artery.  I did discuss with him the possibility of needing aortobifemoral bypass.  2. Coronary artery disease involving native coronary arteries without angina: Continue medical therapy.  3. Hyperlipidemia: He stopped taking atorvastatin without discussing with us.  I elected to start rosuvastatin 40 mg daily.  4. Tobacco use: I again discussed with him the importance of smoking cessation.  Disposition:   FU with me in 1 months  Signed,  Shemeca Lukasik, MD  08/17/2018 12:29 PM    [facility] 

## 2018-08-17 NOTE — Patient Instructions (Addendum)
Medication Instructions:  START Rosuvastatin 40 mg once daily START Aspirin 81 mg once daily  If you need a refill on your cardiac medications before your next appointment, please call your pharmacy.   Lab work: Your provider would like for you to return by May 6th to have the following labs drawn: BMET and CBC. Please go to the [facility] entrance and check in at the front desk. You do not need an appointment.   Testing/Procedures: Your physician has requested that you have a peripheral vascular angiogram. This exam is performed at the hospital. During this exam IV contrast is used to look at arterial blood flow. Please review the information sheet given for details.  Any Other Special Instructions Will Be Listed Below (If Applicable).    [facility] CARDIOVASCULAR DIVISION [facility] [address] [place] [address] Dept: [phone] Loc: [phone]  LANORRIS KRAJICEK  08/17/2018  You are scheduled for a Peripheral Angiogram on Wednesday, May 13 with Dr. Lorine Bears.  1. Please arrive at the [facility] (Main Entrance A) at [facility]: [address] [place], [place] [address] at 6:30 AM (This time is two hours before your procedure to ensure your preparation). Free valet parking service is available.   Special note: Every effort is made to have your procedure done on time. Please understand that emergencies sometimes delay scheduled procedures.  2. Diet: Do not eat solid foods after midnight.  The patient may have clear liquids until 5am upon the day of the procedure.  3. Labs: You will need to have blood drawn by May 6th  4. Medication instructions in preparation for your procedure: None to hold  On the morning of your procedure, take your Aspirin and Plavix/Clopidogrel and any morning medicines NOT listed above.  You may use sips of water.  5. Plan for one night stay--bring personal belongings.  6. Bring a current list of your medications and current insurance cards. 7. You MUST have a responsible person to drive you home. 8. Someone MUST be with you the first 24 hours after you arrive home or your discharge will be delayed. 9. Please wear clothes that are easy to get on and off and wear slip-on shoes.  Thank you for allowing [place] to care for you!   -- [facility]

## 2018-08-26 ENCOUNTER — Telehealth: Payer: Self-pay | Admitting: *Deleted

## 2018-08-26 NOTE — Telephone Encounter (Signed)
Call placed to the patient as a reminder to have his pre procedure labs drawn. He will also have to have a Covid test.   Call placed to set up the appointment for the coronavirus test and they are only open Monday thru Friday 8-12. Message left to call back. The procedure is 09/01/2018.

## 2018-08-26 NOTE — Telephone Encounter (Signed)
Pre testing called back to confirm the test day and time.  The patient as been called and made aware that he will need to go to the [facility] building between 10:30 and 12:30 tomorrow (Friday 5/8) for the coronavirus test and to have his pre procedure labs drawn. He will need to stay quarantined after the test until his procedure.

## 2018-08-27 ENCOUNTER — Ambulatory Visit
Admission: RE | Admit: 2018-08-27 | Discharge: 2018-08-27 | Disposition: A | Discharge status: Home / Self Care | Payer: 59 | Source: Ambulatory Visit | Attending: Cardiovascular Disease | Admitting: Cardiovascular Disease

## 2018-08-27 ENCOUNTER — Other Ambulatory Visit: Payer: Self-pay

## 2018-08-27 DIAGNOSIS — Z1159 Encounter for screening for other viral diseases: Secondary | ICD-10-CM | POA: Insufficient documentation

## 2018-08-27 DIAGNOSIS — I739 Peripheral vascular disease, unspecified: Secondary | ICD-10-CM

## 2018-08-27 DIAGNOSIS — Z01812 Encounter for preprocedural laboratory examination: Secondary | ICD-10-CM | POA: Diagnosis present

## 2018-08-27 DIAGNOSIS — Z01818 Encounter for other preprocedural examination: Secondary | ICD-10-CM

## 2018-08-27 LAB — CBC
HCT: 48.3 % (ref 39.0–52.0)
Hemoglobin: 16.2 g/dL (ref 13.0–17.0)
MCH: 31.6 pg (ref 26.0–34.0)
MCHC: 33.5 g/dL (ref 30.0–36.0)
MCV: 94.2 fL (ref 80.0–100.0)
Platelets: 185 10*3/uL (ref 150–400)
RBC: 5.13 MIL/uL (ref 4.22–5.81)
RDW: 11.8 % (ref 11.5–15.5)
WBC: 6.7 10*3/uL (ref 4.0–10.5)
nRBC: 0 % (ref 0.0–0.2)

## 2018-08-27 LAB — BASIC METABOLIC PANEL
Anion gap: 10 (ref 5–15)
BUN: 13 mg/dL (ref 6–20)
CO2: 24 mmol/L (ref 22–32)
Calcium: 9.4 mg/dL (ref 8.9–10.3)
Chloride: 106 mmol/L (ref 98–111)
Creatinine, Ser: 0.93 mg/dL (ref 0.61–1.24)
GFR calc Af Amer: >60 mL/min (ref >60)
GFR calc non Af Amer: >60 mL/min (ref >60)
Glucose, Bld: 87 mg/dL (ref 70–99)
Potassium: 4.1 mmol/L (ref 3.5–5.1)
Sodium: 140 mmol/L (ref 135–145)

## 2018-08-28 LAB — NOVEL CORONAVIRUS, NAA (HOSP ORDER, SEND-OUT TO REF LAB; TAT 18-24 HRS): SARS-CoV-2, NAA: NOT DETECTED

## 2018-08-31 ENCOUNTER — Telehealth: Payer: Self-pay | Admitting: *Deleted

## 2018-08-31 NOTE — Telephone Encounter (Signed)
Pt contacted pre-PV procedure  scheduled at [facility] for: Wednesday Sep 01, 2018 8:30 AM Verified arrival time and place: [facility] Main Entrance A at: 6:30 AM  No solid food after midnight prior to cath, clear liquids until 5 AM day of procedure.   AM meds can be  taken pre-cath with sip of water including: ASA 81 mg Plavix 75 mg  Confirmed patient has responsible person to drive home post procedure and observe 24 hours after arriving home-yes  Covid 08/27/18 not detected   Pt advised due to Covid-19 pandemic, [facility] is restricting visitors and only patients should present for check-in prior to their procedure. People will not be allowed to enter [facility] with the patient. At this time [facility] is not allowing visitors to all [facility] campuses.        COVID-19 Pre-Screening Questions:  . In the past 7 to 10 days have you had a cough,  shortness of breath, headache, congestion, fever, body aches, chills, sore throat, or sudden loss of taste or sense of smell? no . Have you been around anyone with known Covid 19.no . Have you been around anyone who is awaiting Covid 19 test results in the past 7 to 10 days?  no . Have you been around anyone who has been exposed to Covid 19, or has mentioned symptoms of Covid 19 within the past 7 to 10 days? No   I reviewed procedure instructions,visitor restrictions, Covid 19 screening questions with patient, he verbalized understanding,

## 2018-09-01 ENCOUNTER — Other Ambulatory Visit: Payer: Self-pay

## 2018-09-01 ENCOUNTER — Encounter (HOSPITAL_COMMUNITY)
Admission: RE | Disposition: A | Discharge status: Home / Self Care | Payer: Self-pay | Source: Home / Self Care | Attending: Cardiovascular Disease

## 2018-09-01 ENCOUNTER — Ambulatory Visit (HOSPITAL_COMMUNITY)
Admission: RE | Admit: 2018-09-01 | Discharge: 2018-09-01 | Disposition: A | Discharge status: Home / Self Care | Payer: 59 | Attending: Cardiovascular Disease | Admitting: Cardiovascular Disease

## 2018-09-01 DIAGNOSIS — T82858A Stenosis of vascular prosthetic devices, implants and grafts, initial encounter: Secondary | ICD-10-CM | POA: Diagnosis not present

## 2018-09-01 DIAGNOSIS — I2510 Atherosclerotic heart disease of native coronary artery without angina pectoris: Secondary | ICD-10-CM | POA: Insufficient documentation

## 2018-09-01 DIAGNOSIS — I745 Embolism and thrombosis of iliac artery: Secondary | ICD-10-CM | POA: Diagnosis not present

## 2018-09-01 DIAGNOSIS — Z951 Presence of aortocoronary bypass graft: Secondary | ICD-10-CM | POA: Diagnosis not present

## 2018-09-01 DIAGNOSIS — K219 Gastro-esophageal reflux disease without esophagitis: Secondary | ICD-10-CM | POA: Diagnosis not present

## 2018-09-01 DIAGNOSIS — Z1159 Encounter for screening for other viral diseases: Secondary | ICD-10-CM | POA: Insufficient documentation

## 2018-09-01 DIAGNOSIS — I252 Old myocardial infarction: Secondary | ICD-10-CM | POA: Diagnosis not present

## 2018-09-01 DIAGNOSIS — F17210 Nicotine dependence, cigarettes, uncomplicated: Secondary | ICD-10-CM | POA: Insufficient documentation

## 2018-09-01 DIAGNOSIS — I6529 Occlusion and stenosis of unspecified carotid artery: Secondary | ICD-10-CM | POA: Insufficient documentation

## 2018-09-01 DIAGNOSIS — Z7902 Long term (current) use of antithrombotics/antiplatelets: Secondary | ICD-10-CM | POA: Insufficient documentation

## 2018-09-01 DIAGNOSIS — I70221 Atherosclerosis of native arteries of extremities with rest pain, right leg: Secondary | ICD-10-CM | POA: Insufficient documentation

## 2018-09-01 DIAGNOSIS — E785 Hyperlipidemia, unspecified: Secondary | ICD-10-CM | POA: Diagnosis not present

## 2018-09-01 DIAGNOSIS — Y831 Surgical operation with implant of artificial internal device as the cause of abnormal reaction of the patient, or of later complication, without mention of misadventure at the time of the procedure: Secondary | ICD-10-CM | POA: Diagnosis not present

## 2018-09-01 DIAGNOSIS — I70203 Unspecified atherosclerosis of native arteries of extremities, bilateral legs: Secondary | ICD-10-CM

## 2018-09-01 DIAGNOSIS — I739 Peripheral vascular disease, unspecified: Secondary | ICD-10-CM

## 2018-09-01 HISTORY — PX: PERIPHERAL VASCULAR INTERVENTION: CATH118257

## 2018-09-01 HISTORY — PX: ABDOMINAL AORTOGRAM W/LOWER EXTREMITY: CATH118223

## 2018-09-01 LAB — POCT ACTIVATED CLOTTING TIME
Activated Clotting Time: 213 seconds
Activated Clotting Time: 252 s

## 2018-09-01 SURGERY — ABDOMINAL AORTOGRAM W/LOWER EXTREMITY
Anesthesia: LOCAL

## 2018-09-01 MED ORDER — MIDAZOLAM HCL 2 MG/2ML IJ SOLN
INTRAMUSCULAR | Status: AC
  Filled 2018-09-01: qty 2

## 2018-09-01 MED ORDER — ASPIRIN 81 MG PO CHEW: 81.0000 mg | CHEWABLE_TABLET | ORAL | Status: DC

## 2018-09-01 MED ORDER — SODIUM CHLORIDE 0.9 % IV SOLN: 250.0000 mL | INTRAVENOUS | Status: DC | PRN

## 2018-09-01 MED ORDER — IODIXANOL 320 MG/ML IV SOLN
INTRAVENOUS | Status: DC | PRN
  Administered 2018-09-01: 130 mL via INTRA_ARTERIAL

## 2018-09-01 MED ORDER — FENTANYL CITRATE (PF) 100 MCG/2ML IJ SOLN
INTRAMUSCULAR | Status: AC
  Filled 2018-09-01: qty 2

## 2018-09-01 MED ORDER — MIDAZOLAM HCL 2 MG/2ML IJ SOLN
INTRAMUSCULAR | Status: DC | PRN
  Administered 2018-09-01: 1 mg via INTRAVENOUS

## 2018-09-01 MED ORDER — SODIUM CHLORIDE 0.9% FLUSH: 3.0000 mL | Freq: Two times a day (BID) | INTRAVENOUS | Status: DC

## 2018-09-01 MED ORDER — SODIUM CHLORIDE 0.9% FLUSH: 3.0000 mL | INTRAVENOUS | Status: DC | PRN

## 2018-09-01 MED ORDER — LIDOCAINE HCL (PF) 1 % IJ SOLN
INTRAMUSCULAR | Status: AC
  Filled 2018-09-01: qty 30

## 2018-09-01 MED ORDER — FENTANYL CITRATE (PF) 100 MCG/2ML IJ SOLN
INTRAMUSCULAR | Status: DC | PRN
  Administered 2018-09-01 (×4): 50 ug via INTRAVENOUS

## 2018-09-01 MED ORDER — HEPARIN SODIUM (PORCINE) 1000 UNIT/ML IJ SOLN
INTRAMUSCULAR | Status: AC
  Filled 2018-09-01: qty 1

## 2018-09-01 MED ORDER — HEPARIN SODIUM (PORCINE) 1000 UNIT/ML IJ SOLN
INTRAMUSCULAR | Status: DC | PRN
  Administered 2018-09-01: 8000 [IU] via INTRAVENOUS
  Administered 2018-09-01: 2000 [IU] via INTRAVENOUS

## 2018-09-01 MED ORDER — HYDRALAZINE HCL 20 MG/ML IJ SOLN: 5.0000 mg | INTRAMUSCULAR | Status: DC | PRN

## 2018-09-01 MED ORDER — ONDANSETRON HCL 4 MG/2ML IJ SOLN: 4.0000 mg | Freq: Four times a day (QID) | INTRAMUSCULAR | Status: DC | PRN

## 2018-09-01 MED ORDER — SODIUM CHLORIDE 0.9 % IV SOLN: INTRAVENOUS | Status: DC

## 2018-09-01 MED ORDER — CLOPIDOGREL BISULFATE 300 MG PO TABS
ORAL_TABLET | ORAL | Status: AC
  Filled 2018-09-01: qty 1

## 2018-09-01 MED ORDER — ACETAMINOPHEN 325 MG PO TABS: 650.0000 mg | ORAL_TABLET | ORAL | Status: DC | PRN

## 2018-09-01 MED ORDER — HEPARIN (PORCINE) IN NACL 1000-0.9 UT/500ML-% IV SOLN
INTRAVENOUS | Status: AC
  Filled 2018-09-01: qty 1000

## 2018-09-01 MED ORDER — LIDOCAINE HCL (PF) 1 % IJ SOLN
INTRAMUSCULAR | Status: DC | PRN
  Administered 2018-09-01: 15 mL

## 2018-09-01 MED ORDER — HEPARIN (PORCINE) IN NACL 1000-0.9 UT/500ML-% IV SOLN
INTRAVENOUS | Status: DC | PRN
  Administered 2018-09-01 (×2): 500 mL

## 2018-09-01 MED ORDER — CLOPIDOGREL BISULFATE 300 MG PO TABS
ORAL_TABLET | ORAL | Status: DC | PRN
  Administered 2018-09-01: 300 mg via ORAL

## 2018-09-01 MED ORDER — SODIUM CHLORIDE 0.9 % IV SOLN
INTRAVENOUS | Status: DC
  Administered 2018-09-01: 07:00:00 via INTRAVENOUS

## 2018-09-01 SURGICAL SUPPLY — 26 items
BALLN MUSTANG 7.0X40 75 (BALLOONS) ×3
BALLN MUSTANG 9X20X75 (BALLOONS) ×3
BALLN MUSTANG 9X60X75 (BALLOONS) ×3
BALLOON MUSTANG 7.0X40 75 (BALLOONS) ×2 IMPLANT
BALLOON MUSTANG 9X20X75 (BALLOONS) ×2 IMPLANT
BALLOON MUSTANG 9X60X75 (BALLOONS) ×2 IMPLANT
CATH ANGIO 5F PIGTAIL 65CM (CATHETERS) ×3 IMPLANT
CLOSURE MYNX CONTROL 6F/7F (Vascular Products) ×6 IMPLANT
KIT ENCORE 26 ADVANTAGE (KITS) ×6 IMPLANT
KIT MICROPUNCTURE NIT STIFF (SHEATH) ×3 IMPLANT
KIT PV (KITS) ×3 IMPLANT
SHEATH BRITE TIP 7FR 35CM (SHEATH) ×6 IMPLANT
SHEATH PINNACLE 5F 10CM (SHEATH) ×3 IMPLANT
SHEATH PINNACLE 7F 10CM (SHEATH) ×6 IMPLANT
SHEATH PROBE COVER 6X72 (BAG) ×6 IMPLANT
STENT EPIC 10X40X75 (Permanent Stent) ×3 IMPLANT
STENT VIABAHN 8X29X80 VBX (Permanent Stent) ×3 IMPLANT
STENT VIABAHN 8X39X80 VBX (Permanent Stent) ×3 IMPLANT
STENT VIABAHNBX 8X29X135 (Permanent Stent) ×3 IMPLANT
STOPCOCK MORSE 400PSI 3WAY (MISCELLANEOUS) ×3 IMPLANT
SYR MEDRAD MARK 7 150ML (SYRINGE) ×3 IMPLANT
TRANSDUCER W/STOPCOCK (MISCELLANEOUS) ×3 IMPLANT
TRAY PV CATH (CUSTOM PROCEDURE TRAY) ×3 IMPLANT
TUBING CIL FLEX 10 FLL-RA (TUBING) ×3 IMPLANT
WIRE HITORQ VERSACORE ST 145CM (WIRE) ×6 IMPLANT
WIRE VERSACORE LOC 115CM (WIRE) ×3 IMPLANT

## 2018-09-01 NOTE — Interval H&P Note (Signed)
History and Physical Interval Note:  09/01/2018 8:28 AM  [name]  has presented today for surgery, with the diagnosis of pad.  The various methods of treatment have been discussed with the patient and family. After consideration of risks, benefits and other options for treatment, the patient has consented to  Procedure(s): ABDOMINAL AORTOGRAM W/LOWER EXTREMITY (N/A) as a surgical intervention.  The patient's history has been reviewed, patient examined, no change in status, stable for surgery.  I have reviewed the patient's chart and labs.  Questions were answered to the patient's satisfaction.     Lorine Bears

## 2018-09-01 NOTE — Discharge Instructions (Signed)
Femoral Site Care °This sheet gives you information about how to care for yourself after your procedure. Your health care provider may also give you more specific instructions. If you have problems or questions, contact your health care provider. °What can I expect after the procedure? °After the procedure, it is common to have: °· Bruising that usually fades within 1-2 weeks. °· Tenderness at the site. °Follow these instructions at home: °Wound care °· Follow instructions from your health care provider about how to take care of your insertion site. Make sure you: °? Wash your hands with soap and water before you change your bandage (dressing). If soap and water are not available, use hand sanitizer. °? Change your dressing as told by your health care provider. °? Leave stitches (sutures), skin glue, or adhesive strips in place. These skin closures may need to stay in place for 2 weeks or longer. If adhesive strip edges start to loosen and curl up, you may trim the loose edges. Do not remove adhesive strips completely unless your health care provider tells you to do that. °· Do not take baths, swim, or use a hot tub until your health care provider approves. °· You may shower 24-48 hours after the procedure or as told by your health care provider. °? Gently wash the site with plain soap and water. °? Pat the area dry with a clean towel. °? Do not rub the site. This may cause bleeding. °· Do not apply powder or lotion to the site. Keep the site clean and dry. °· Check your femoral site every day for signs of infection. Check for: °? Redness, swelling, or pain. °? Fluid or blood. °? Warmth. °? Pus or a bad smell. °Activity °· For the first 2-3 days after your procedure, or as long as directed: °? Avoid climbing stairs as much as possible. °? Do not squat. °· Do not lift anything that is heavier than 10 lb (4.5 kg), or the limit that you are told, until your health care provider says that it is safe. °· Rest as  directed. °? Avoid sitting for a long time without moving. Get up to take short walks every 1-2 hours. °· Do not drive for 24 hours if you were given a medicine to help you relax (sedative). °General instructions °· Take over-the-counter and prescription medicines only as told by your health care provider. °· Keep all follow-up visits as told by your health care provider. This is important. °Contact a health care provider if you have: °· A fever or chills. °· You have redness, swelling, or pain around your insertion site. °Get help right away if: °· The catheter insertion area swells very fast. °· You pass out. °· You suddenly start to sweat or your skin gets clammy. °· The catheter insertion area is bleeding, and the bleeding does not stop when you hold steady pressure on the area. °· The area near or just beyond the catheter insertion site becomes pale, cool, tingly, or numb. °These symptoms may represent a serious problem that is an emergency. Do not wait to see if the symptoms will go away. Get medical help right away. Call your local emergency services (911 in the [place] Do not drive yourself to the hospital. °Summary °· After the procedure, it is common to have bruising that usually fades within 1-2 weeks. °· Check your femoral site every day for signs of infection. °· Do not lift anything that is heavier than 10 lb (4.5 kg), or the   limit that you are told, until your health care provider says that it is safe. °This information is not intended to replace advice given to you by your health care provider. Make sure you discuss any questions you have with your health care provider. °Document Released: 12/09/2013 Document Revised: 04/20/2017 Document Reviewed: 04/20/2017 °Elsevier Interactive Patient Education © 2019 [employer]. ° °

## 2018-09-01 NOTE — Progress Notes (Signed)
Ambulated to bathroom to void tol well  

## 2018-09-01 NOTE — Progress Notes (Addendum)
Discharge instructions given to pt wife via telephone voices understanding, reviewed with pt voices understanding

## 2018-09-02 ENCOUNTER — Other Ambulatory Visit: Payer: Self-pay | Admitting: *Deleted

## 2018-09-02 ENCOUNTER — Encounter (HOSPITAL_COMMUNITY): Payer: Self-pay | Admitting: Cardiovascular Disease

## 2018-09-02 ENCOUNTER — Ambulatory Visit: Payer: 59 | Admitting: Cardiovascular Disease

## 2018-09-02 DIAGNOSIS — I739 Peripheral vascular disease, unspecified: Secondary | ICD-10-CM

## 2018-09-04 ENCOUNTER — Other Ambulatory Visit: Payer: Self-pay | Admitting: Cardiovascular Disease

## 2018-09-07 ENCOUNTER — Telehealth: Payer: Self-pay | Admitting: Cardiovascular Disease

## 2018-09-07 NOTE — Telephone Encounter (Signed)
-----   Message from Sandi Mariscal, RN sent at 09/02/2018 12:51 PM EDT ----- Regarding: aorta and f/u The patient needs an aorta and abi (post procedure) in two weeks with an in office follow up with Dr. Kirke Corin on the same day or shortly after, please.  Thank you, Misty Stanley

## 2018-09-07 NOTE — Telephone Encounter (Signed)
No ans no vm   Holding 5/27 slot

## 2018-09-15 ENCOUNTER — Other Ambulatory Visit: Payer: Self-pay

## 2018-09-15 ENCOUNTER — Telehealth: Payer: Self-pay

## 2018-09-15 ENCOUNTER — Ambulatory Visit (INDEPENDENT_AMBULATORY_CARE_PROVIDER_SITE_OTHER): Payer: 59

## 2018-09-15 ENCOUNTER — Other Ambulatory Visit: Payer: Self-pay | Admitting: Cardiovascular Disease

## 2018-09-15 DIAGNOSIS — I739 Peripheral vascular disease, unspecified: Secondary | ICD-10-CM

## 2018-09-15 NOTE — Telephone Encounter (Signed)

## 2018-09-17 ENCOUNTER — Other Ambulatory Visit: Payer: Self-pay

## 2018-09-17 ENCOUNTER — Ambulatory Visit (INDEPENDENT_AMBULATORY_CARE_PROVIDER_SITE_OTHER): Payer: 59 | Admitting: Cardiovascular Disease

## 2018-09-17 ENCOUNTER — Encounter: Payer: Self-pay | Admitting: Cardiovascular Disease

## 2018-09-17 VITALS — BP 106/80 | HR 75 | Temp 97.6°F | Ht 74.0 in | Wt 229.2 lb

## 2018-09-17 DIAGNOSIS — Z720 Tobacco use: Secondary | ICD-10-CM

## 2018-09-17 DIAGNOSIS — I739 Peripheral vascular disease, unspecified: Secondary | ICD-10-CM | POA: Diagnosis not present

## 2018-09-17 DIAGNOSIS — I2510 Atherosclerotic heart disease of native coronary artery without angina pectoris: Secondary | ICD-10-CM

## 2018-09-17 DIAGNOSIS — E785 Hyperlipidemia, unspecified: Secondary | ICD-10-CM

## 2018-09-17 NOTE — Patient Instructions (Signed)
Medication Instructions:  Your physician recommends that you continue on your current medications as directed. Please refer to the Current Medication list given to you today.  If you need a refill on your cardiac medications before your next appointment, please call your pharmacy.   Lab work: None ordered If you have labs (blood work) drawn today and your tests are completely normal, you will receive your results only by: Marland Kitchen MyChart Message (if you have MyChart) OR . A paper copy in the mail If you have any lab test that is abnormal or we need to change your treatment, we will call you to review the results.  Testing/Procedures: Your physician has requested that you have an ankle brachial index (ABI). During this test an ultrasound and blood pressure cuff are used to evaluate the arteries that supply the arms and legs with blood. Allow thirty minutes for this exam. There are no restrictions or special instructions. (To be scheduled in 6 months)  Your physician has requested that you have a lower or  extremity arterial duplex. This test is an ultrasound of the arteries in the legs or arms. It looks at arterial blood flow in the legs and arms. Allow one hour for Lower and Upper Arterial scans. There are no restrictions or special instructions.  (To be scheduled in 6 months)   Follow-Up: At [facility], you and your health needs are our priority.  As part of our continuing mission to provide you with exceptional heart care, we have created designated Provider Care Teams.  These Care Teams include your primary Cardiologist (physician) and Advanced Practice Providers (APPs -  Physician Assistants and Nurse Practitioners) who all work together to provide you with the care you need, when you need it. You will need a follow up appointment in 6 months.  Please call our office 2 months in advance to schedule this appointment.  You may see  Dr.Arida  or one of the following Advanced Practice Providers on  your designated Care Team:   Nicolasa Ducking, NP Eula Listen, PA-C . Marisue Ivan, PA-C

## 2018-09-17 NOTE — Progress Notes (Signed)
Cardiology Office Note   Date:  09/17/2018   ID:  Brandon Parks, DOB 04-09-1981, MRN [id number]  PCP:  Patient, No Pcp Per  Cardiologist:   Lorine Bears, MD   Chief Complaint  Patient presents with  . other    1 month follow up s/p PV cath. Meds reviewed by the pt. verbally. "doing well."       History of Present Illness: Brandon Parks is a 38 y.o. male who presents for a follow-up visit of  peripheral arterial disease and coronary artery disease. He has known history of coronary artery disease status post CABG in July 2013 for ostial LAD plaque rupture with extensive thrombus and possible dissection , tobacco use and hyperlipidemia.  He was seen in 2016 for severe right leg claudication due to severe right common iliac artery stenosis which was treated successfully with stent placement at that time. He developed recurrent claudication due to severe in-stent restenosis. Duplex also showed progression of left common iliac artery stenosis. He underwent bilateral common iliac artery kissing stent placement in 06/2016. A covered stent was used on the right side.  He was seen recently for recurrent severe right leg claudication.  Duplex showed an interval occlusion of the right common iliac artery stent with significant restenosis on the left side I proceeded with angiography earlier this month which showed occluded right common iliac artery stent and significant restenosis in the left common iliac artery stent.  I performed successful complex endovascular intervention with bilateral covered stent placement extending into the distal aorta and self-expanding stent placement distal to the right common iliac artery stent just proximal to the origin of the internal iliac artery. Postprocedure Doppler showed normal ABI and patent stents. He is doing well and reports resolution of claudication.  He is trying to quit smoking.   Past Medical History:  Diagnosis Date  . CAD  (coronary artery disease)   . Carotid artery occlusion   . GERD (gastroesophageal reflux disease)   . MI (myocardial infarction) (HCC) 2013  . NSTEMI (non-ST elevated myocardial infarction) (HCC)   . Pneumothorax on left   . Tendinitis    bilateral    Past Surgical History:  Procedure Laterality Date  . ABDOMINAL AORTOGRAM N/A 06/25/2016   Procedure: Abdominal Aortogram;  Surgeon: Iran Ouch, MD;  Location: [facility] INV[facility];  Service: Cardiovascular;  Laterality: N/A;  . ABDOMINAL AORTOGRAM W/LOWER EXTREMITY N/A 09/01/2018   Procedure: ABDOMINAL AORTOGRAM W/LOWER EXTREMITY;  Surgeon: Iran Ouch, MD;  Location: [facility] INVASIVE CV LAB;  Service: Cardiovascular;  Laterality: N/A;  . APPENDECTOMY    . CARDIAC CATHETERIZATION  2013   @ [facility]; [place]  . CARDIAC CATHETERIZATION  Aug 2013   @ [facility]  . CORONARY ANGIOPLASTY  2013   @[facility]   . CORONARY ARTERY BYPASS GRAFT  2013   @ [facility]  . LOWER EXTREMITY ANGIOGRAPHY N/A 06/25/2016   Procedure: Lower Extremity Angiography;  Surgeon: Iran Ouch, MD;  Location: [facility] CV LAB;  Service: Cardiovascular;  Laterality: N/A;  Limited study  . PERIPHERAL VASCULAR CATHETERIZATION N/A 02/28/2015   Procedure: Abdominal Aortogram;  Surgeon: Iran Ouch, MD;  Location: [facility];  Service: Cardiovascular;  Laterality: N/A;  . PERIPHERAL VASCULAR INTERVENTION Bilateral 06/25/2016   Procedure: Peripheral Vascular Intervention;  Surgeon: Iran Ouch, MD;  Location: [facility];  Service: Cardiovascular;  Laterality: Bilateral;  Bilateral Kissing Iliac stents  . PERIPHERAL VASCULAR INTERVENTION Bilateral 09/01/2018  Procedure: PERIPHERAL VASCULAR INTERVENTION;  Surgeon: Iran OuchArida, Oracio Galen A, MD;  Location: [facility];  Service: Cardiovascular;  Laterality: Bilateral;  bilateral common iliacs     Current Outpatient Medications  Medication Sig Dispense Refill  . aspirin EC 81 MG tablet Take 1 tablet (81 mg total) by  mouth daily. 90 tablet 3  . clopidogrel (PLAVIX) 75 MG tablet TAKE 1 TABLET BY MOUTH  DAILY 90 tablet 0  . MAGNESIUM PO Take 1 tablet by mouth daily.    . nitroGLYCERIN (NITROSTAT) 0.4 MG SL tablet Place 1 tablet (0.4 mg total) under the tongue every 5 (five) minutes as needed. 25 tablet 6  . rosuvastatin (CRESTOR) 40 MG tablet Take 1 tablet (40 mg total) by mouth daily. 90 tablet 3   No current facility-administered medications for this visit.     Allergies:   Patient has no known allergies.    Social History:  The patient  reports that he has been smoking cigarettes. He has a 1.75 pack-year smoking history. He has never used smokeless tobacco. He reports current alcohol use of about 2.0 standard drinks of alcohol per week. He reports previous drug use. Drug: Marijuana.   Family History:  The patient's Family history is unknown by patient.    ROS:  Please see the history of present illness.   Otherwise, review of systems are positive for none.   All other systems are reviewed and negative.    PHYSICAL EXAM: VS:  BP 106/80 (BP Location: Right Arm, Patient Position: Sitting, Cuff Size: Normal)   Pulse 75   Temp 97.6 F (36.4 C)   Ht 6\' 2"  (1.88 m)   Wt 229 lb 4 oz (104 kg)   SpO2 98%   BMI 29.43 kg/m  , BMI Body mass index is 29.43 kg/m. GEN: Well nourished, well developed, in no acute distress  HEENT: normal  Neck: no JVD, carotid bruits, or masses Cardiac: RRR; no murmurs, rubs, or gallops,no edema  Respiratory:  clear to auscultation bilaterally, normal work of breathing GI: soft, nontender, nondistended, + BS MS: no deformity or atrophy  Skin: warm and dry, no rash Neuro:  Strength and sensation are intact Psych: euthymic mood, full affect Vascular: Femoral pulses normal bilaterally with no groin hematoma.  Distal pulses are palpable.  EKG:  EKG is not ordered today.  Recent Labs: 08/27/2018: BUN 13; Creatinine, Ser 0.93; Hemoglobin 16.2; Platelets 185; Potassium 4.1;  Sodium 140    Lipid Panel    Component Value Date/Time   CHOL 126 12/26/2016 0833   CHOL 208 (H) 08/12/2016 0808   TRIG 91 12/26/2016 0833   HDL 22 (L) 12/26/2016 0833   HDL 25 (L) 08/12/2016 0808   CHOLHDL 5.7 12/26/2016 0833   VLDL 18 12/26/2016 0833   LDLCALC 86 12/26/2016 0833   LDLCALC 159 (H) 08/12/2016 0808      Wt Readings from Last 3 Encounters:  09/17/18 229 lb 4 oz (104 kg)  09/01/18 224 lb (101.6 kg)  08/17/18 230 lb 8 oz (104.6 kg)      No flowsheet data found.    ASSESSMENT AND PLAN:  1.  Peripheral arterial disease:  Status post recent re-intervention on his bilateral iliac artery disease with covered stent placements extending into the distal aorta.  Continue indefinite dual antiplatelet therapy.  Repeat vascular studies in 6 months.   We have exhausted endovascular options for his disease.  If he develops recurrent restenosis, I think his best option is an aortobifemoral  bypass.  2. Coronary artery disease involving native coronary arteries without angina: Continue medical therapy.  3. Hyperlipidemia: Continue high-dose rosuvastatin.  He will need a follow-up lipid and liver profile.  4. Tobacco use: I again discussed with him the importance of smoking cessation.  He wants to quit smoking on his own.   Disposition:   FU with me in 6 months  Signed,  Lorine Bears, MD  09/17/2018 11:41 AM    [facility]

## 2018-11-01 ENCOUNTER — Telehealth: Payer: Self-pay | Admitting: Cardiovascular Disease

## 2018-11-01 DIAGNOSIS — I739 Peripheral vascular disease, unspecified: Secondary | ICD-10-CM

## 2018-11-01 NOTE — Telephone Encounter (Signed)
Patient starts this weekend on Saturday, he was outside and had an episode that he could not use his left leg. States he cannot walk further than 20 feet and his leg goes numb. Denies any swelling, discoloration. States when he stops moving it is ok, any further his leg cramps and goes numb. Please call to discuss.

## 2018-11-01 NOTE — Telephone Encounter (Signed)
Spoke with pt and on [place] approx 1/8 of mile with son and upon return noted sig leg numbness cramping and "locking up" Pt had to stop multiple times to rest and allow feeling to return was only able to walk approx 10 feet at a time and had to rest and then continue on. Per pt this is worse than it has ever been as far as symptoms.Appt made with Dr Fletcher Anon for 11/04/18 at 9:00 am Will forward to Dr Fletcher Anon for review and recommendations ./cy

## 2018-11-01 NOTE — Telephone Encounter (Signed)
Patient calling to check status of careplan and to see if Dr. Fletcher Anon wants to give a referral for bypass

## 2018-11-01 NOTE — Telephone Encounter (Signed)
Schedule him for CT angio of abdomen and pelvis with distal runoff to evaluate this urgently.

## 2018-11-02 ENCOUNTER — Other Ambulatory Visit: Payer: Self-pay

## 2018-11-02 ENCOUNTER — Telehealth: Payer: Self-pay

## 2018-11-02 ENCOUNTER — Other Ambulatory Visit
Admission: RE | Admit: 2018-11-02 | Discharge: 2018-11-02 | Disposition: A | Discharge status: Home / Self Care | Payer: 59 | Source: Ambulatory Visit | Attending: Cardiovascular Disease | Admitting: Cardiovascular Disease

## 2018-11-02 DIAGNOSIS — I739 Peripheral vascular disease, unspecified: Secondary | ICD-10-CM | POA: Insufficient documentation

## 2018-11-02 LAB — BASIC METABOLIC PANEL
Anion gap: 11 (ref 5–15)
BUN: 11 mg/dL (ref 6–20)
CO2: 21 mmol/L — ABNORMAL LOW (ref 22–32)
Calcium: 8.8 mg/dL — ABNORMAL LOW (ref 8.9–10.3)
Chloride: 107 mmol/L (ref 98–111)
Creatinine, Ser: 0.95 mg/dL (ref 0.61–1.24)
GFR calc Af Amer: >60 mL/min (ref >60)
GFR calc non Af Amer: >60 mL/min (ref >60)
Glucose, Bld: 98 mg/dL (ref 70–99)
Potassium: 4.2 mmol/L (ref 3.5–5.1)
Sodium: 139 mmol/L (ref 135–145)

## 2018-11-02 NOTE — Telephone Encounter (Signed)
Spoke with the pt. Pt [place] [facility] [place] [place] [facility] [place] [facility] [place] for his CT angio scheduled for 11/03/18 @ 10am @ the [place] location. NPO 4 hours prior to the test. Pt adv he is to arrive 32min prior and he will need to wear a face mask. Pt aware of the test time date and location. Pt adv to contact [facility] scheduling if he needs to reschedule telephone number provided.  Pt adv to keep his appt schedule with Dr.Arida on 11/03/18 @ [place] screening done.       COVID-19 Pre-Screening Questions:  . In the past 7 to 10 days have you had a cough,  shortness of breath, headache, congestion, fever (100 or greater) body aches, chills, sore throat, or sudden loss of taste or sense of smell? No . Have you been around anyone with known Covid 19. No . Have you been around anyone who is awaiting Covid 19 test results in the past 7 to 10 days? No . Have you been around anyone who has been exposed to Covid 19, or has mentioned symptoms of Covid 19 within the past 7 to 10 days? No

## 2018-11-02 NOTE — Telephone Encounter (Signed)
Call received from [facility] in the radiology dept at the [facility] in [place]. Pt CTA order needs to be changed to [id number] to include run off and order needs to be signed off by Dr.Arida asap. Adv her that prior Josem Kaufmann is pending.  Order in [place] and it has been signed off by Dr.Arida.  Called to update [facility] [phone].lmtcb.

## 2018-11-03 ENCOUNTER — Other Ambulatory Visit: Payer: Self-pay

## 2018-11-03 ENCOUNTER — Encounter (INDEPENDENT_AMBULATORY_CARE_PROVIDER_SITE_OTHER): Payer: Self-pay

## 2018-11-03 ENCOUNTER — Ambulatory Visit
Admission: RE | Admit: 2018-11-03 | Discharge: 2018-11-03 | Disposition: A | Discharge status: Home / Self Care | Payer: 59 | Source: Ambulatory Visit | Attending: Cardiovascular Disease | Admitting: Cardiovascular Disease

## 2018-11-03 DIAGNOSIS — I739 Peripheral vascular disease, unspecified: Secondary | ICD-10-CM | POA: Insufficient documentation

## 2018-11-03 MED ORDER — IOPAMIDOL (ISOVUE-370) INJECTION 76%
150.0000 mL | Freq: Once | INTRAVENOUS | Status: AC | PRN
  Administered 2018-11-03: 120 mL via INTRAVENOUS

## 2018-11-04 ENCOUNTER — Encounter: Payer: Self-pay | Admitting: Cardiovascular Disease

## 2018-11-04 ENCOUNTER — Inpatient Hospital Stay (HOSPITAL_COMMUNITY)
Admission: EM | Admit: 2018-11-04 | Discharge: 2018-11-12 | DRG: 271 | Disposition: A | Discharge status: Home / Self Care | Payer: 59 | Attending: Vascular Surgery | Admitting: Vascular Surgery

## 2018-11-04 ENCOUNTER — Encounter (HOSPITAL_COMMUNITY): Payer: Self-pay | Admitting: Emergency Medicine

## 2018-11-04 ENCOUNTER — Ambulatory Visit (INDEPENDENT_AMBULATORY_CARE_PROVIDER_SITE_OTHER): Payer: 59 | Admitting: Cardiovascular Disease

## 2018-11-04 VITALS — BP 90/60 | HR 77 | Temp 97.0°F | Ht 74.0 in | Wt 229.5 lb

## 2018-11-04 DIAGNOSIS — Z7982 Long term (current) use of aspirin: Secondary | ICD-10-CM

## 2018-11-04 DIAGNOSIS — I708 Atherosclerosis of other arteries: Secondary | ICD-10-CM | POA: Diagnosis present

## 2018-11-04 DIAGNOSIS — Y831 Surgical operation with implant of artificial internal device as the cause of abnormal reaction of the patient, or of later complication, without mention of misadventure at the time of the procedure: Secondary | ICD-10-CM | POA: Diagnosis present

## 2018-11-04 DIAGNOSIS — I7409 Other arterial embolism and thrombosis of abdominal aorta: Secondary | ICD-10-CM

## 2018-11-04 DIAGNOSIS — I25118 Atherosclerotic heart disease of native coronary artery with other forms of angina pectoris: Secondary | ICD-10-CM

## 2018-11-04 DIAGNOSIS — I739 Peripheral vascular disease, unspecified: Secondary | ICD-10-CM | POA: Diagnosis present

## 2018-11-04 DIAGNOSIS — I745 Embolism and thrombosis of iliac artery: Secondary | ICD-10-CM | POA: Diagnosis present

## 2018-11-04 DIAGNOSIS — I70212 Atherosclerosis of native arteries of extremities with intermittent claudication, left leg: Secondary | ICD-10-CM

## 2018-11-04 DIAGNOSIS — Z7902 Long term (current) use of antithrombotics/antiplatelets: Secondary | ICD-10-CM | POA: Diagnosis not present

## 2018-11-04 DIAGNOSIS — K219 Gastro-esophageal reflux disease without esophagitis: Secondary | ICD-10-CM | POA: Diagnosis present

## 2018-11-04 DIAGNOSIS — D62 Acute posthemorrhagic anemia: Secondary | ICD-10-CM | POA: Diagnosis not present

## 2018-11-04 DIAGNOSIS — F17210 Nicotine dependence, cigarettes, uncomplicated: Secondary | ICD-10-CM | POA: Diagnosis present

## 2018-11-04 DIAGNOSIS — I2510 Atherosclerotic heart disease of native coronary artery without angina pectoris: Secondary | ICD-10-CM | POA: Diagnosis present

## 2018-11-04 DIAGNOSIS — Z951 Presence of aortocoronary bypass graft: Secondary | ICD-10-CM

## 2018-11-04 DIAGNOSIS — E785 Hyperlipidemia, unspecified: Secondary | ICD-10-CM | POA: Diagnosis present

## 2018-11-04 DIAGNOSIS — T82898A Other specified complication of vascular prosthetic devices, implants and grafts, initial encounter: Secondary | ICD-10-CM | POA: Diagnosis present

## 2018-11-04 DIAGNOSIS — Z9861 Coronary angioplasty status: Secondary | ICD-10-CM

## 2018-11-04 DIAGNOSIS — Z01810 Encounter for preprocedural cardiovascular examination: Secondary | ICD-10-CM | POA: Diagnosis not present

## 2018-11-04 DIAGNOSIS — Z720 Tobacco use: Secondary | ICD-10-CM

## 2018-11-04 DIAGNOSIS — I6529 Occlusion and stenosis of unspecified carotid artery: Secondary | ICD-10-CM | POA: Diagnosis present

## 2018-11-04 DIAGNOSIS — Z79899 Other long term (current) drug therapy: Secondary | ICD-10-CM | POA: Diagnosis not present

## 2018-11-04 DIAGNOSIS — I252 Old myocardial infarction: Secondary | ICD-10-CM

## 2018-11-04 DIAGNOSIS — I10 Essential (primary) hypertension: Secondary | ICD-10-CM | POA: Diagnosis present

## 2018-11-04 DIAGNOSIS — Z20828 Contact with and (suspected) exposure to other viral communicable diseases: Secondary | ICD-10-CM | POA: Diagnosis present

## 2018-11-04 DIAGNOSIS — M79605 Pain in left leg: Secondary | ICD-10-CM

## 2018-11-04 HISTORY — DX: Peripheral vascular disease, unspecified: I73.9

## 2018-11-04 LAB — PROTIME-INR
INR: 1.1 (ref 0.8–1.2)
Prothrombin Time: 13.7 seconds (ref 11.4–15.2)

## 2018-11-04 LAB — CBC WITH DIFFERENTIAL/PLATELET
Abs Immature Granulocytes: 0.03 10*3/uL (ref 0.00–0.07)
Basophils Absolute: 0 10*3/uL (ref 0.0–0.1)
Basophils Relative: 1 %
Eosinophils Absolute: 0.2 10*3/uL (ref 0.0–0.5)
Eosinophils Relative: 2 %
HCT: 48.2 % (ref 39.0–52.0)
Hemoglobin: 15.5 g/dL (ref 13.0–17.0)
Immature Granulocytes: 0 %
Lymphocytes Relative: 21 %
Lymphs Abs: 1.7 10*3/uL (ref 0.7–4.0)
MCH: 31.4 pg (ref 26.0–34.0)
MCHC: 32.2 g/dL (ref 30.0–36.0)
MCV: 97.8 fL (ref 80.0–100.0)
Monocytes Absolute: 0.9 10*3/uL (ref 0.1–1.0)
Monocytes Relative: 11 %
Neutro Abs: 5.4 10*3/uL (ref 1.7–7.7)
Neutrophils Relative %: 65 %
Platelets: 155 10*3/uL (ref 150–400)
RBC: 4.93 MIL/uL (ref 4.22–5.81)
RDW: 11.8 % (ref 11.5–15.5)
WBC: 8.3 10*3/uL (ref 4.0–10.5)
nRBC: 0 % (ref 0.0–0.2)

## 2018-11-04 LAB — COMPREHENSIVE METABOLIC PANEL
ALT: 22 U/L (ref 0–44)
AST: 17 U/L (ref 15–41)
Albumin: 3.9 g/dL (ref 3.5–5.0)
Alkaline Phosphatase: 58 U/L (ref 38–126)
Anion gap: 10 (ref 5–15)
BUN: 13 mg/dL (ref 6–20)
CO2: 24 mmol/L (ref 22–32)
Calcium: 9.2 mg/dL (ref 8.9–10.3)
Chloride: 106 mmol/L (ref 98–111)
Creatinine, Ser: 1.16 mg/dL (ref 0.61–1.24)
GFR calc Af Amer: >60 mL/min (ref >60)
GFR calc non Af Amer: >60 mL/min (ref >60)
Glucose, Bld: 91 mg/dL (ref 70–99)
Potassium: 4.6 mmol/L (ref 3.5–5.1)
Sodium: 140 mmol/L (ref 135–145)
Total Bilirubin: 0.5 mg/dL (ref 0.3–1.2)
Total Protein: 7.2 g/dL (ref 6.5–8.1)

## 2018-11-04 LAB — URINALYSIS, ROUTINE W REFLEX MICROSCOPIC
Bilirubin Urine: NEGATIVE
Glucose, UA: NEGATIVE mg/dL
Hgb urine dipstick: NEGATIVE
Ketones, ur: NEGATIVE mg/dL
Leukocytes,Ua: NEGATIVE
Nitrite: NEGATIVE
Protein, ur: 30 mg/dL — AB
Specific Gravity, Urine: 1.026 (ref 1.005–1.030)
pH: 6 (ref 5.0–8.0)

## 2018-11-04 LAB — SARS CORONAVIRUS 2 BY RT PCR (HOSPITAL ORDER, PERFORMED IN ~~LOC~~ HOSPITAL LAB): SARS Coronavirus 2: NEGATIVE

## 2018-11-04 LAB — HEPARIN LEVEL (UNFRACTIONATED): Heparin Unfractionated: 0.31 IU/mL (ref 0.30–0.70)

## 2018-11-04 MED ORDER — ACETAMINOPHEN 325 MG PO TABS: 650.0000 mg | ORAL_TABLET | ORAL | Status: DC | PRN

## 2018-11-04 MED ORDER — HEPARIN (PORCINE) 25000 UT/250ML-% IV SOLN
1800.0000 [IU]/h | INTRAVENOUS | Status: DC
  Administered 2018-11-04: 1600 [IU]/h via INTRAVENOUS
  Administered 2018-11-05: 1800 [IU]/h via INTRAVENOUS
  Administered 2018-11-05: 1700 [IU]/h via INTRAVENOUS
  Administered 2018-11-06 – 2018-11-08 (×3): 1800 [IU]/h via INTRAVENOUS
  Filled 2018-11-04 (×7): qty 250

## 2018-11-04 MED ORDER — HEPARIN BOLUS VIA INFUSION
6000.0000 [IU] | Freq: Once | INTRAVENOUS | Status: AC
  Administered 2018-11-04: 6000 [IU] via INTRAVENOUS
  Filled 2018-11-04: qty 6000

## 2018-11-04 MED ORDER — ONDANSETRON HCL 4 MG/2ML IJ SOLN: 4.0000 mg | Freq: Four times a day (QID) | INTRAMUSCULAR | Status: DC | PRN

## 2018-11-04 MED ORDER — ASPIRIN EC 81 MG PO TBEC
81.0000 mg | DELAYED_RELEASE_TABLET | Freq: Every day | ORAL | Status: DC
  Administered 2018-11-04 – 2018-11-07 (×4): 81 mg via ORAL
  Filled 2018-11-04 (×4): qty 1

## 2018-11-04 MED ORDER — ROSUVASTATIN CALCIUM 20 MG PO TABS
40.0000 mg | ORAL_TABLET | Freq: Every day | ORAL | Status: DC
  Administered 2018-11-05 – 2018-11-07 (×3): 40 mg via ORAL
  Filled 2018-11-04 (×3): qty 2

## 2018-11-04 NOTE — Progress Notes (Signed)
[place] for heparin Indication: arterial occlusion of the left common iliac artery stent  No Known Allergies  Patient Measurements: Height: 6\' 2"  (188 cm) Weight: 229 lb 8 oz (104.1 kg) IBW/kg (Calculated) : 82.2 Heparin Dosing Weight: 103.2  Vital Signs: Temp: 98.4 F (36.9 C) (07/16 2004) Temp Source: Oral (07/16 2004) BP: 131/75 (07/16 1524) Pulse Rate: 69 (07/16 2004)  Labs: Recent Labs    11/02/18 0932 11/04/18 1335 11/04/18 2119  HGB  --  15.5  --   HCT  --  48.2  --   PLT  --  155  --   LABPROT  --  13.7  --   INR  --  1.1  --   HEPARINUNFRC  --   --  0.31  CREATININE 0.95 1.16  --     Estimated Creatinine Clearance: 111.1 mL/min (by C-G formula based on SCr of 1.16 mg/dL).   Medical History: Past Medical History:  Diagnosis Date  . CAD (coronary artery disease)   . Carotid artery occlusion   . GERD (gastroesophageal reflux disease)   . MI (myocardial infarction) ([place]) 2013  . NSTEMI (non-ST elevated myocardial infarction) ([place])   . Pneumothorax on left   . Tendinitis    bilateral    Medications:  Infusions:  . heparin 1,600 Units/hr (11/04/18 2000)    Assessment: Pt is a 38YOM w/ c/o left leg pain and numbness. Pt has hx of PAD and is s/p multiple stent placements in both legs. Currently pt has acute occlusion of the left common iliac artery stent and is d/t be admitted for aortobifemoral bypass surgery early next week. No anticoagulation, but Plavix and ASA PTA. Last dose of Plavix was 7/15. Hgb 15.5 and Plts 155. Pharmacy to dose heparin.  Initial heparin level = 0.31  Goal of Therapy:  Heparin level 0.3-0.7 units/ml Monitor platelets by anticoagulation protocol: Yes   Plan:  Increase heparin to 1700 units / hr Follow up AM labs F/u surgery plans  Thank you Anette Guarneri, PharmD 11/04/2018,10:21 PM

## 2018-11-04 NOTE — ED Provider Notes (Signed)
Medical screening examination/treatment/procedure(s) were conducted as a shared visit with non-physician practitioner(s) and myself.  I personally evaluated the patient during the encounter. Briefly, the patient is a 38 y.o. male here after CT scan showed occlusion of his left common iliac artery stent that he has had placed previously.  Unable to palpate or Doppler pulse in the left foot.  Does have some numbness in the foot but has good strength, left foot feels cold, poor capillary refill.  His cardiologist ordered CTA yesterday and vascular surgery is aware of the patient.  Patient otherwise appears well.  Will be started on IV heparin and be admitted to medicine and likely will have vascular surgery intervention.  This chart was dictated using voice recognition software.  Despite best efforts to proofread,  errors can occur which can change the documentation meaning.     EKG Interpretation  Date/Time:  Thursday November 04 2018 13:10:56 EDT Ventricular Rate:  73 PR Interval:    QRS Duration: 107 QT Interval:  382 QTC Calculation: 421 R Axis:   102 Text Interpretation:  Sinus rhythm Probable right ventricular hypertrophy Abnrm T, consider ischemia, anterolateral lds Confirmed by Lennice Sites [phone]) on 11/04/2018 1:14:55 PM           Lennice Sites, DO 11/04/18 1401

## 2018-11-04 NOTE — Discharge Instructions (Addendum)
 [facility] [place] [facility]  Discharge Instructions   Open Aortic Surgery  Please refer to the following instructions for your post-procedure care. Your surgeon or Physician Assistant will discuss any changes with you.  Activity  Avoid lifting more than eight pounds (a gallon of milk) until after your first post-operative visit. You are encouraged to walk as much as you can. You can slowly return to normal activities but must avoid strenuous activity and heavy lifting until your doctor tells you it's okay. Heavy lifting can hurt the incision and cause a hernia. Avoid activities such as vacuuming or swinging a golf club. It is normal to feel tired for several weeks after your surgery. Do not drive until your doctor gives the okay and you are no longer taking prescription pain medications. It is also normal to have difficulty with sleep habits, eating and bowl movements after surgery. These will go away with time.  Bathing/Showering  Shower daily after you go home. Do not soak in a bathtub, hot tub, or swim until the incision heals.  Incision Care  Shower every day. Clean your incision with mild soap and water. Pat the area dry with a clean towel. You do not need a bandage unless otherwise instructed. Do not apply any ointments or creams to your incision. You may have skin glue on your incision. Do not peel it off. It will come off on its own in about one week. If you have staples or sutures along your incision, they will be removed at your post op appointment.  If you have groin incisions, wash the groin wounds with soap and water daily and pat dry. (No tub bath-only shower)  Then put a dry gauze or washcloth in the groin to keep this area dry to help prevent wound infection.  Do this daily and as needed.  Do not use Vaseline or neosporin on your incisions.  Only use soap and water on your incisions and then protect and keep dry.  Diet  Resume your normal diet. There are no  special food restriction following this procedure. A low fat/low cholesterol diet is recommended for all patients with vascular disease. After your aortic surgery, it's normal to feel full faster than usual and to not feel as hungry as you normally would. You will probably lose weight initially following your surgery. It's best to eat small, frequent meals over the course of the day. Call the office if you find that you are unable to eat even small meals.   In order to heal from your surgery, it is CRITICAL to get adequate nutrition. Your body requires vitamins, minerals, and protein. Vegetables are the best source of vitamins and minerals. If you have pain, you may take over-the-counter pain reliever such as acetaminophen (Tylenol). If you were prescribed a stronger pain medication, please be aware these medication can cause nausea and constipation. Prevent nausea by taking the medication with a snack or meal. Avoid constipation by drinking plenty of fluids and eating foods with a high amount of fiber, such as fruits, vegetables and grains. Take 100mg of the over-the-counter stool softener Colace twice a day as needed to help with constipation. A laxative, such as Milk of Magnesia, may be recommended for you at this time. Do not take a laxative unless your surgeon or P.A. tells you it's OK.  Do not take Tylenol if you are taking stronger pain medications (such as Percocet).  Follow Up  Our office will schedule a follow up   appointment 2-3 weeks after discharge.  Please call us immediately for any of the following conditions    .     Severe or worsening pain in your legs or feet or in your abdomen back or chest. Increased pain, redness drainage (pus) from your incision site. Increased abdominal pain, bloating, nausea, vomiting, or persistent diarrhea. Fever of 101 degrees or higher. Swelling in your leg (s).  Reduce your risk of vascular disease  Stop smoking. If you would like help, call  [facility] at 1-800-QUIT-NOW (1-800-784-8669) or [place] at 336-586-4000. Manage your cholesterol Maintain a desired weight Control your diabetes Keep your blood pressure down  If you have any questions please call the office at [phone].   

## 2018-11-04 NOTE — Patient Instructions (Signed)
Medication Instructions:  Your physician recommends that you continue on your current medications as directed. Please refer to the Current Medication list given to you today.  If you need a refill on your cardiac medications before your next appointment, please call your pharmacy.   Lab work: None ordered If you have labs (blood work) drawn today and your tests are completely normal, you will receive your results only by: Marland Kitchen MyChart Message (if you have MyChart) OR . A paper copy in the mail If you have any lab test that is abnormal or we need to change your treatment, we will call you to review the results.  Testing/Procedures: None ordered  Follow-Up: At [facility], you and your health needs are our priority.  As part of our continuing mission to provide you with exceptional heart care, we have created designated Provider Care Teams.  These Care Teams include your primary Cardiologist (physician) and Advanced Practice Providers (APPs -  Physician Assistants and Nurse Practitioners) who all work together to provide you with the care you need, when you need it. You will need a follow up appointment as planned  You may see Dr.Arida or one of the following Advanced Practice Providers on your designated Care Team:   Murray Hodgkins, NP Christell Faith, PA-C . Marrianne Mood, PA-C  Any Other Special Instructions Will Be Listed Below (If Applicable).  Please report to [facility]

## 2018-11-04 NOTE — ED Triage Notes (Signed)
Pt was sent here for vascular consult due to having left leg pain and numbness while walking pt states he has multiple stents placed earlier this year and was sent here for possible bypass surgery.

## 2018-11-04 NOTE — Progress Notes (Addendum)
ANTICOAGULATION CONSULT NOTE - Initial Consult  Pharmacy Consult for heparin Indication: arterial occlusion of the left common iliac artery stent  No Known Allergies  Patient Measurements:   Heparin Dosing Weight: 103.2  Vital Signs: Temp: 97.8 F (36.6 C) (07/16 1311) Temp Source: Oral (07/16 1311) BP: 128/66 (07/16 1330) Pulse Rate: 75 (07/16 1330)  Labs: Recent Labs    11/02/18 0932 11/04/18 1335  HGB  --  15.5  HCT  --  48.2  PLT  --  155  LABPROT  --  13.7  INR  --  1.1  CREATININE 0.95 1.16    Estimated Creatinine Clearance: 111.1 mL/min (by C-G formula based on SCr of 1.16 mg/dL).   Medical History: Past Medical History:  Diagnosis Date  . CAD (coronary artery disease)   . Carotid artery occlusion   . GERD (gastroesophageal reflux disease)   . MI (myocardial infarction) ([place]) 2013  . NSTEMI (non-ST elevated myocardial infarction) ([place])   . Pneumothorax on left   . Tendinitis    bilateral    Medications:  Infusions:  . heparin      Assessment: Pt is a 38YOM w/ c/o left leg pain and numbness. Pt has hx of PAD and is s/p multiple stent placements in both legs. Currently pt has acute occlusion of the left common iliac artery stent and is d/t be admitted for aortobifemoral bypass surgery early next week. No anticoagulation, but Plavix and ASA PTA. Last dose of Plavix was 7/15. Hgb 15.5 and Plts 155. Pharmacy to dose heparin.  Goal of Therapy:  Heparin level 0.3-0.7 units/ml Monitor platelets by anticoagulation protocol: Yes   Plan:  Heparin 6000 unit bolus IV x1 then heparin 1600 units/hr IV Check 6 hour HL Monitor daily HL and CBC, s/sx of bleed F/u surgery plans  Jaylyn Iyer 11/04/2018,2:52 PM

## 2018-11-04 NOTE — Consult Note (Addendum)
Hospital Consult    Reason for Consult:  Life limiting claudication left leg Requesting Physician:  Kirke Corinrida MRN #:  [id number]  History of Present Illness: This is a 38 y.o. male who is a pt of Dr. Kirke CorinArida and has undergone bilateral iliac stenting in the past.  This past Saturday, the developed pain in the left leg.  He states he is only able to walk about 20-30 feet before he develops pain.  He states if he tries to walk through it, his leg locks up. He does not have any rest pain at this time.  He does not have pain in the right leg.  His plavix has been discontinued and plan for heparin gtt for worry of thrombosis of stent and embolization.    [facility]  Left thigh GSV was harvested.   There is documentation of a carotid occlusion, but I cannot find a study in our system or care everywhere to verify this.  He denies hx of stroke.    He states he has had significant chills the past couple of nights.    Pt states he does not have family hx of PAD.  He states that his brother drinks alcohol and does not have PAD issues.    The pt is on a statin for cholesterol management.  The pt is on a daily aspirin.   Other AC:  None (Plavix d/c'd yesterday) The pt is not on medication for hypertension.   The pt is not diabetic.   Tobacco hx:  current  Past Medical History:  Diagnosis Date   CAD (coronary artery disease)    Carotid artery occlusion    GERD (gastroesophageal reflux disease)    MI (myocardial infarction) ([facility]) 2013   NSTEMI (non-ST elevated myocardial infarction) ([facility])    Pneumothorax on left    Tendinitis    bilateral    Past Surgical History:  Procedure Laterality Date   ABDOMINAL AORTOGRAM N/A 06/25/2016   Procedure: Abdominal Aortogram;  Surgeon: Iran OuchMuhammad A Arida, MD;  Location: [facility];  Service: Cardiovascular;  Laterality: N/A;   ABDOMINAL AORTOGRAM W/LOWER EXTREMITY N/A 09/01/2018   Procedure: ABDOMINAL AORTOGRAM W/LOWER  EXTREMITY;  Surgeon: Iran OuchArida, Muhammad A, MD;  Location: [facility];  Service: Cardiovascular;  Laterality: N/A;   APPENDECTOMY     CARDIAC CAT[facility]TERIZ[facility]  2013   @ [facility] [place]   CARDIAC CATHETERIZATION  Aug 2013   @ [facility]   CORONARY ANGIOPLASTY  2013   @[facility]    CORONARY ARTERY BYPASS GRAFT  2013   @ [facility]   LOWER EXTREMITY ANGIOGRAPHY N/A 06/25/2016   Procedure: Lower Extremity Angiography;  Surgeon: Iran OuchMuhammad A Arida, MD;  Location: [facility];  Service: Cardiovascular;  Laterality: N/A;  Limited study   PERIPHERAL VASCULAR CATHETERIZATION N/A 02/28/2015   Procedure: Abdominal Aortogram;  Surgeon: Iran OuchMuhammad A Arida, MD;  Location: [facility];  Service: Cardiovascular;  Laterality: N/A;   PERIPHERAL VASCULAR INTERVENTION Bilateral 06/25/2016   Procedure: Peripheral Vascular Intervention;  Surgeon: Iran OuchMuhammad A Arida, MD;  Location: [facility];  Service: Cardiovascular;  Laterality: Bilateral;  Bilateral Kissing Iliac stents   PERIPHERAL VASCULAR INTERVENTION Bilateral 09/01/2018   Procedure: PERIPHERAL VASCULAR INTERVENTION;  Surgeon: Iran OuchArida, Muhammad A, MD;  Location: [facility];  Service: Cardiovascular;  Laterality: Bilateral;  bilateral common iliacs    No Known Allergies  Prior to Admission medications   Medication Sig Start Date End Date  Taking? Authorizing Provider  aspirin EC 81 MG tablet Take 1 tablet (81 mg total) by mouth daily. 08/17/18  Yes Wellington Hampshire, MD  clopidogrel (PLAVIX) 75 MG tablet TAKE 1 TABLET BY MOUTH  DAILY Patient taking differently: Take 75 mg by mouth daily.  09/06/18  Yes Wellington Hampshire, MD  nitroGLYCERIN (NITROSTAT) 0.4 MG SL tablet Place 1 tablet (0.4 mg total) under the tongue every 5 (five) minutes as needed. 02/03/14  Yes Gollan, Kathlene November, MD  rosuvastatin (CRESTOR) 40 MG tablet Take 1 tablet (40 mg total) by mouth daily. 08/17/18 11/15/18 Yes Wellington Hampshire, MD    Social History   Socioeconomic  History   Marital status: Married    Spouse name: Not on file   Number of children: Not on file   Years of education: Not on file   Highest education level: Not on file  Occupational History   Not on file  Social Needs   Financial resource strain: Not on file   Food insecurity    Worry: Not on file    Inability: Not on file   Transportation needs    Medical: Not on file    Non-medical: Not on file  Tobacco Use   Smoking status: Current Every Day Smoker    Packs/day: 0.25    Years: 7.00    Pack years: 1.75    Types: Cigarettes    Last attempt to quit: 11/09/2011    Years since quitting: 6.9   Smokeless tobacco: Never Used   Tobacco comment: 5-6 a day  Substance and Sexual Activity   Alcohol use: Yes    Alcohol/week: 2.0 standard drinks    Types: 2 Cans of beer per week    Comment: ocassionally   Drug use: Not Currently    Types: Marijuana    Comment: past   Sexual activity: Not on file  Lifestyle   Physical activity    Days per week: Not on file    Minutes per session: Not on file   Stress: Not on file  Relationships   Social connections    Talks on phone: Not on file    Gets together: Not on file    Attends religious service: Not on file    Active member of club or organization: Not on file    Attends meetings of clubs or organizations: Not on file    Relationship status: Not on file   Intimate partner violence    Fear of current or ex partner: Not on file    Emotionally abused: Not on file    Physically abused: Not on file    Forced sexual activity: Not on file  Other Topics Concern   Not on file  Social History Narrative   Not on file    See HPI for family history Family History  Family history unknown: Yes    ROS: [x]  Positive   [ ]  Negative   [ ]  All sytems reviewed and are negative  Cardiac: [x]  hx MI/CABG   Vascular: [x]  pain in legs while walking []  pain in legs at rest []  pain in legs at night []  non-healing  ulcers []  hx of DVT []  swelling in legs  Pulmonary: []  productive cough []  asthma/wheezing []  home O2  Neurologic: []  weakness in []  arms []  legs []  numbness in []  arms []  legs []  hx of CVA []  mini stroke [] difficulty speaking or slurred speech []  temporary loss of vision in one eye []  dizziness  Hematologic: []  hx of cancer []  bleeding problems []  problems with blood clotting easily  Endocrine:   []  diabetes []  thyroid disease  GI []  vomiting blood []  blood in stool  GU: []  CKD/renal failure []  HD--[]  M/W/F or []  T/T/S []  burning with urination []  blood in urine  Psychiatric: []  anxiety []  depression  Musculoskeletal: []  arthritis []  joint pain  Integumentary: []  rashes []  ulcers  Constitutional: []  fever [x]  chills   Physical Examination  Vitals:   11/04/18 1311 11/04/18 1330  BP: (!) 135/91 128/66  Pulse: 82 75  Resp: 18 19  Temp: 97.8 F (36.6 C)   SpO2: 98% 98%   There is no height or weight on file to calculate BMI.  General:  WDWN in NAD Gait: Not observed HENT: WNL, normocephalic Pulmonary: normal non-labored breathing, without Rales, rhonchi,  wheezing Cardiac: regular, without  Murmurs, rubs or gallops; without carotid bruits Abdomen:  soft, NT/ND, no masses Skin: without rashes Vascular Exam/Pulses:  Right Left  Radial 2+ (normal) 2+ (normal)  Ulnar Unable to palpate  Unable to palpate   Femoral 2+ (normal) absent  DP brisk absent  PT brisk monophasic   Extremities: without ischemic changes, without Gangrene , without cellulitis; without open wounds;  Musculoskeletal: no muscle wasting or atrophy  Neurologic: A&O X 3;  No focal weakness or paresthesias are detected; speech is fluent/normal Psychiatric:  The pt has Normal affect. Lymph:  No inguinal lymphadenopathy    CBC    Component Value Date/Time   WBC 8.3 11/04/2018 1335   RBC 4.93 11/04/2018 1335   HGB 15.5 11/04/2018 1335   HGB 16.8 06/20/2016 1429   HCT  48.2 11/04/2018 1335   HCT 47.0 06/20/2016 1429   PLT 155 11/04/2018 1335   PLT 167 06/20/2016 1429   MCV 97.8 11/04/2018 1335   MCV 87 06/20/2016 1429   MCV 92 11/20/2011 1404   MCH 31.4 11/04/2018 1335   MCHC 32.2 11/04/2018 1335   RDW 11.8 11/04/2018 1335   RDW 12.2 (L) 06/20/2016 1429   RDW 12.5 11/20/2011 1404   LYMPHSABS 1.7 11/04/2018 1335   MONOABS 0.9 11/04/2018 1335   EOSABS 0.2 11/04/2018 1335   BASOSABS 0.0 11/04/2018 1335    BMET    Component Value Date/Time   NA 140 11/04/2018 1335   NA 143 06/20/2016 1429   NA 140 11/20/2011 1404   K 4.6 11/04/2018 1335   K 4.0 11/20/2011 1404   CL 106 11/04/2018 1335   CL 104 11/20/2011 1404   CO2 24 11/04/2018 1335   CO2 29 11/20/2011 1404   GLUCOSE 91 11/04/2018 1335   GLUCOSE 142 (H) 11/20/2011 1404   BUN 13 11/04/2018 1335   BUN 13 06/20/2016 1429   BUN 14 11/20/2011 1404   CREATININE 1.16 11/04/2018 1335   CREATININE 1.40 (H) 11/20/2011 1404   CALCIUM 9.2 11/04/2018 1335   CALCIUM 9.2 11/20/2011 1404   GFRNONAA >60 11/04/2018 1335   GFRNONAA >60 11/20/2011 1404   GFRAA >60 11/04/2018 1335   GFRAA >60 11/20/2011 1404    COAGS: Lab Results  Component Value Date   INR 1.1 11/04/2018   INR 1.0 06/20/2016   INR 1.0 02/26/2015     Non-Invasive Vascular Imaging:   CTA Aortobifem 11/03/2018: IMPRESSION: VASCULAR  1. Moderate amount of irregular noncalcified atherosclerotic plaque with the distal aspect of the abdominal aorta, not resulting in a hemodynamically significant stenosis.  Left lower extremity vascular Impression:  1. Acute occlusion of the  left common iliac arterial stent with occlusive thrombus extending approximately 2.6 cm caudal to the distal landing zone of the stent with associated adjacent perivascular stranding. 2. No definitive evidence of distal embolism to the left lower extremity, though evaluation of the left foot is degraded secondary to patient motion and as there is  age-indeterminate non visualization of a patent left-sided dorsalis pedis artery, embolic disease cannot be excluded on the basis of this examination.  Right lower extremity vascular Impression:  1. Overlap stents within the right common iliac artery widely patent without evidence of in stent stenosis. 2. Three-vessel runoff to the right lower leg, however a patent right-sided dorsalis pedis artery is not identified.  NON-VASCULAR  1. Incidentally noted benign hepatic hemangiomas.  Critical Value/emergent results were called by telephone at the time of interpretation on 11/03/2018 at 2:29 pm to Dr. Lorine BearsMUHAMMAD ARIDA , who verbally acknowledged these results.   ASSESSMENT/PLAN: This is a 38 y.o. male with hx of bilateral iliac stenting.  Pt now with acute occlusion of left iliac artery stent.  -pt does not have rest pain, but does have severe claudication at 20-10130ft.  Dr. Kirke CorinArida has discontinued his Plavix and he is being admitted to the hospital by Cardiology and being placed on a heparin gtt. -pt will be scheduled for aortobifemoral bypass grafting on Monday with Dr. Arbie CookeyEarly -Dr. Myra GianottiBrabham will be by to see pt later today to evaluate pt and discuss surgery. -discussed importance of smoking cessation with pt given his hx of CABG and PAD.     Doreatha MassedSamantha Rhyne, PA-C [facility] [phone]   I agree with the above.  This is a 38 year old gentleman who has previously undergone CABG at age 38.  He has had several percutaneous attempts at revascularization of his iliac arteries.  These have had limited durability, and we have been asked to consider aortobifemoral bypass graft.  He now has left iliac stent occlusion.  I will order preoperative carotid Doppler studies.  His Plavix is to be discontinued.  I anticipate aortobifemoral bypass graft by Dr. early on Monday  Durene CalWells Franceska Strahm

## 2018-11-04 NOTE — ED Notes (Signed)
Unable to hear/palpate pulse in left foot when use of doppler

## 2018-11-04 NOTE — H&P (Signed)
See note below which serves as H&P  Date:  11/04/2018   ID:  Brandon Parks, DOB [birth year], MRN [id number]  PCP:  Patient, No Pcp Per      Cardiologist:   Kathlyn Sacramento, MD       Chief Complaint  Patient presents with  . other    Need to discuss the next step with stenting difficulties. "doing well." Pt. c/o left leg numbness.     History of Present Illness: Brandon Parks is a 38 y.o. male who presents for a follow-up visit of  peripheral arterial disease and coronary artery disease. He has known history of coronary artery disease status post CABG in July 2013 for ostial LAD plaque rupture with extensive thrombus and possible dissection , tobacco use and hyperlipidemia.   He was seen in 2016 for severe right leg claudication due to severe right common iliac artery stenosis which was treated successfully with stent placement at that time.He developed recurrent claudication due to severe in-stent restenosis. Duplex also showed progression of left common iliac artery stenosis. He underwent bilateral common iliac artery kissing stent placement in 06/2016. A covered stent was used on the right side.  He was seen in May of this year for recurrent severe right leg claudication.   Duplex showed an interval occlusion of the right common iliac artery stent with significant restenosis on the left side I proceeded with angiography in May which showed occluded right common iliac artery stent and significant restenosis in the left common iliac artery stent.  I performed successful complex endovascular intervention with bilateral covered stent placement extending into the distal aorta and self-expanding stent placement distal to the right common iliac artery stent just proximal to the origin of the internal iliac artery. Postprocedure Doppler showed normal ABI and patent stents.  He did fine with no recurrent claudication until this past weekend on Saturday when he was walking and  all of a sudden had significant left leg pain with numbness. Since then, he gets significant pain after walking a short distance.  In addition, he reports mild pain around the umbilicus. However, this actually has been present since most recent stenting.  He underwent CT angiogram yesterday which showed acute occlusion of the left common iliac artery stent with occlusive thrombus extending 2.6 cm caudal to the stent.  The dorsalis pedis could not be well visualized on both sides.       Past Medical History:  Diagnosis Date  . CAD (coronary artery disease)   . Carotid artery occlusion   . GERD (gastroesophageal reflux disease)   . MI (myocardial infarction) ([place]) 2013  . NSTEMI (non-ST elevated myocardial infarction) ([place])   . Pneumothorax on left   . Tendinitis    bilateral         Past Surgical History:  Procedure Laterality Date  . ABDOMINAL AORTOGRAM N/A 06/25/2016   Procedure: Abdominal Aortogram;  Surgeon: Wellington Hampshire, MD;  Location: [facility];  Service: Cardiovascular;  Laterality: N/A;  . ABDOMINAL AORTOGRAM W/LOWER EXTREMITY N/A 09/01/2018   Procedure: ABDOMINAL AORTOGRAM W/LOWER EXTREMITY;  Surgeon: Wellington Hampshire, MD;  Location: [facility];  Service: Cardiovascular;  Laterality: N/A;  . APPENDECTOMY    . CARDIAC CATHETERIZATION  2013   @ [place]; [place]  . CARDIAC CATHETERIZATION  Aug 2013   @ [place]  . CORONARY ANGIOPLASTY  2013   @[facility]   . CORONARY ARTERY BYPASS GRAFT  2013   @ [place]  .  LOWER EXTREMITY ANGIOGRAPHY N/A 06/25/2016   Procedure: Lower Extremity Angiography;  Surgeon: Iran OuchMuhammad A Arida, MD;  [facility];  Service: Cardiovascular;  Laterality: N/A;  Limited study  . PERIPHERAL VASCULAR CATHETERIZATION N/A 02/28/2015   Procedure: Abdominal Aortogram;  Surgeon: Iran OuchMuhammad A Arida, MD;  [facility];  Service: Cardiovascular;  Laterality: N/A;  . PERIPHERAL VASCULAR INTERVENTION Bilateral  06/25/2016   Procedure: Peripheral Vascular Intervention;  Surgeon: Iran OuchMuhammad A Arida, MD;  [facility];  Service: Cardiovascular;  Laterality: Bilateral;  Bilateral Kissing Iliac stents  . PERIPHERAL VASCULAR INTERVENTION Bilateral 09/01/2018   Procedure: PERIPHERAL VASCULAR INTERVENTION;  Surgeon: Iran OuchArida, Muhammad A, MD;  [facility];  Service: Cardiovascular;  Laterality: Bilateral;  bilateral common iliacs           Current Outpatient Medications  Medication Sig Dispense Refill  . aspirin EC 81 MG tablet Take 1 tablet (81 mg total) by mouth daily. 90 tablet 3  . clopidogrel (PLAVIX) 75 MG tablet TAKE 1 TABLET BY MOUTH  DAILY 90 tablet 0  . MAGNESIUM PO Take 1 tablet by mouth daily.    . nitroGLYCERIN (NITROSTAT) 0.4 MG SL tablet Place 1 tablet (0.4 mg total) under the tongue every 5 (five) minutes as needed. 25 tablet 6  . rosuvastatin (CRESTOR) 40 MG tablet Take 1 tablet (40 mg total) by mouth daily. 90 tablet 3   No current facility-administered medications for this visit.     Allergies:   Patient has no known allergies.    Social History:  The patient  reports that he has been smoking cigarettes. He has a 1.75 pack-year smoking history. He has never used smokeless tobacco. He reports current alcohol use of about 2.0 standard drinks of alcohol per week. He reports previous drug use. Drug: Marijuana.   Family History:  The patient's Family history is unknown by patient.    ROS:  Please see the history of present illness.   Otherwise, review of systems are positive for none.   All other systems are reviewed and negative.    PHYSICAL EXAM: VS:  BP 90/60 (BP Location: Left Arm, Patient Position: Sitting, Cuff Size: Normal)   Pulse 77   Temp (!) 97 F (36.1 C)   Ht 6\' 2"  (1.88 m)   Wt 229 lb 8 oz (104.1 kg)   BMI 29.47 kg/m  , BMI Body mass index is 29.47 kg/m. GEN: Well nourished, well developed, in no acute distress  HEENT:  normal  Neck: no JVD, carotid bruits, or masses Cardiac: RRR; no murmurs, rubs, or gallops,no edema  Respiratory:  clear to auscultation bilaterally, normal work of breathing GI: soft, nontender, nondistended, + BS MS: no deformity or atrophy  Skin: warm and dry, no rash Neuro:  Strength and sensation are intact Psych: euthymic mood, full affect Vascular: Femoral pulse is normal on the right and absent on the left.  Distal pulses are also palpable on the right but not the left.  The left foot is not cold and there is normal color.  EKG:  EKG is  ordered today. EKG showed normal sinus rhythm with incomplete right bundle branch block.  Recent Labs: 08/27/2018: Hemoglobin 16.2; Platelets 185 11/02/2018: BUN 11; Creatinine, Ser 0.95; Potassium 4.2; Sodium 139    Lipid Panel Labs (Brief)          Component Value Date/Time   CHOL 126 12/26/2016 0833   CHOL 208 (H) 08/12/2016 65780808  TRIG 91 12/26/2016 0833   HDL 22 (L) 12/26/2016 0833   HDL 25 (L) 08/12/2016 0808   CHOLHDL 5.7 12/26/2016 0833   VLDL 18 12/26/2016 0833   LDLCALC 86 12/26/2016 0833   LDLCALC 159 (H) 08/12/2016 0808          Wt Readings from Last 3 Encounters:  11/04/18 229 lb 8 oz (104.1 kg)  09/17/18 229 lb 4 oz (104 kg)  09/01/18 224 lb (101.6 kg)     No flowsheet data found.   ASSESSMENT AND PLAN:  1.  Peripheral arterial disease with acute occlusion of the left common iliac artery stent: Fortunately, he is not having rest pain but obviously, he is extremely limited by this.  In addition, I am concerned about the large thrombus with potential for embolization.  Left foot exam is unremarkable. The patient will require aortobifemoral bypass given that he has failed multiple endovascular intervention.  In order to do that, Plavix will have to be stopped.  His last dose was yesterday.  In doing so, there will be a risk of acute closure of the right common iliac artery stents.  Due to all of that,  I think the best option is to admit him to the hospital and start him on heparin drip.  I will consult vascular surgery with plans for aortobifemoral bypass likely early next week.  2. Coronary artery disease involving native coronary arteries without angina: Continue medical therapy.  The patient has not had any recent ischemic events and his functional capacity is very good before closure of the left iliac stent.  Thus, I do not think further ischemic work-up is needed before surgery.  3. Hyperlipidemia: Continue high-dose rosuvastatin.    He will need a follow-up lipid and liver profile.  4. Tobacco use: I again discussed with him the importance of smoking cessation.     Disposition:   The [facility].  Signed,  Lorine BearsMuhammad Arida, MD  11/04/2018 9:32 AM    [facility]

## 2018-11-04 NOTE — Progress Notes (Signed)
Cardiology Office Note   Date:  11/04/2018   ID:  Brandon Parks, DOB 12/01/1980, MRN [id number]  PCP:  Patient, No Pcp Per  Cardiologist:   Brandon BearsMuhammad Jazzmon Prindle, MD   Chief Complaint  Patient presents with  . other    Need to discuss the next step with stenting difficulties. "doing well." Pt. c/o left leg numbness.       History of Present Illness: Brandon Parks is a 38 y.o. male who presents for a follow-up visit of  peripheral arterial disease and coronary artery disease. He has known history of coronary artery disease status post CABG in July 2013 for ostial LAD plaque rupture with extensive thrombus and possible dissection , tobacco use and hyperlipidemia.  He was seen in 2016 for severe right leg claudication due to severe right common iliac artery stenosis which was treated successfully with stent placement at that time. He developed recurrent claudication due to severe in-stent restenosis. Duplex also showed progression of left common iliac artery stenosis. He underwent bilateral common iliac artery kissing stent placement in 06/2016. A covered stent was used on the right side.  He was seen in May of this year for recurrent severe right leg claudication.  Duplex showed an interval occlusion of the right common iliac artery stent with significant restenosis on the left side I proceeded with angiography in May which showed occluded right common iliac artery stent and significant restenosis in the left common iliac artery stent.  I performed successful complex endovascular intervention with bilateral covered stent placement extending into the distal aorta and self-expanding stent placement distal to the right common iliac artery stent just proximal to the origin of the internal iliac artery. Postprocedure Doppler showed normal ABI and patent stents.  He did fine with no recurrent claudication until this past weekend on Saturday when he was walking and all of a sudden had  significant left leg pain with numbness.  Since then, he gets significant pain after walking a short distance.  In addition, he reports mild pain around the umbilicus.  However, this actually has been present since most recent stenting.  He underwent CT angiogram yesterday which showed acute occlusion of the left common iliac artery stent with occlusive thrombus extending 2.6 cm caudal to the stent.  The dorsalis pedis could not be well visualized on both sides.   Past Medical History:  Diagnosis Date  . CAD (coronary artery disease)   . Carotid artery occlusion   . GERD (gastroesophageal reflux disease)   . MI (myocardial infarction) ([facility]) 2013  . NSTEMI (non-ST elevated myocardial infarction) ([facility])   . Pneumothorax on left   . Tendinitis    bilateral    Past Surgical History:  Procedure Laterality Date  . ABDOMINAL AORTOGRAM N/A 06/25/2016   Procedure: Abdominal Aortogram;  Surgeon: Iran OuchMuhammad A Laporchia Nakajima, MD;  [facility];  Service: Cardiovascular;  Laterality: N/A;  . ABDOMINAL AORTOGRAM W/LOWER EXTREMITY N/A 09/01/2018   Procedure: ABDOMINAL AORTOGRAM W/LOWER EXTREMITY;  Surgeon: Iran OuchArida, Marthe Dant A, MD;  [facility];  Service: Cardiovascular;  Laterality: N/A;  . APPENDECTOMY    . CARDIAC CATHETERIZATION  2013   @ [facility]; [place]  . CARDIAC CATHETERIZATION  Aug 2013   @ [facility]  . CORONARY ANGIOPLASTY  2013   @[facility]   . CORONARY ARTERY BYPASS GRAFT  2013   @ [facility]  . LOWER EXTREMITY ANGIOGRAPHY N/A 06/25/2016   Procedure: Lower Extremity Angiography;  Surgeon: Iran OuchMuhammad A Mashell Sieben,  MD;  Location: [facility];  Service: Cardiovascular;  Laterality: N/A;  Limited study  . PERIPHERAL VASCULAR CATHETERIZATION N/A 02/28/2015   Procedure: Abdominal Aortogram;  Surgeon: Wellington Hampshire, MD;  Location: [facility];  Service: Cardiovascular;  Laterality: N/A;  . PERIPHERAL VASCULAR INTERVENTION Bilateral 06/25/2016   Procedure: Peripheral Vascular  Intervention;  Surgeon: Wellington Hampshire, MD;  Location: [facility];  Service: Cardiovascular;  Laterality: Bilateral;  Bilateral Kissing Iliac stents  . PERIPHERAL VASCULAR INTERVENTION Bilateral 09/01/2018   Procedure: PERIPHERAL VASCULAR INTERVENTION;  Surgeon: Wellington Hampshire, MD;  Location: [facility];  Service: Cardiovascular;  Laterality: Bilateral;  bilateral common iliacs     Current Outpatient Medications  Medication Sig Dispense Refill  . aspirin EC 81 MG tablet Take 1 tablet (81 mg total) by mouth daily. 90 tablet 3  . clopidogrel (PLAVIX) 75 MG tablet TAKE 1 TABLET BY MOUTH  DAILY 90 tablet 0  . MAGNESIUM PO Take 1 tablet by mouth daily.    . nitroGLYCERIN (NITROSTAT) 0.4 MG SL tablet Place 1 tablet (0.4 mg total) under the tongue every 5 (five) minutes as needed. 25 tablet 6  . rosuvastatin (CRESTOR) 40 MG tablet Take 1 tablet (40 mg total) by mouth daily. 90 tablet 3   No current facility-administered medications for this visit.     Allergies:   Patient has no known allergies.    Social History:  The patient  reports that he has been smoking cigarettes. He has a 1.75 pack-year smoking history. He has never used smokeless tobacco. He reports current alcohol use of about 2.0 standard drinks of alcohol per week. He reports previous drug use. Drug: Marijuana.   Family History:  The patient's Family history is unknown by patient.    ROS:  Please see the history of present illness.   Otherwise, review of systems are positive for none.   All other systems are reviewed and negative.    PHYSICAL EXAM: VS:  BP 90/60 (BP Location: Left Arm, Patient Position: Sitting, Cuff Size: Normal)   Pulse 77   Temp (!) 97 F (36.1 C)   Ht 6\' 2"  (1.88 m)   Wt 229 lb 8 oz (104.1 kg)   BMI 29.47 kg/m  , BMI Body mass index is 29.47 kg/m. GEN: Well nourished, well developed, in no acute distress  HEENT: normal  Neck: no JVD, carotid bruits, or masses Cardiac: RRR; no  murmurs, rubs, or gallops,no edema  Respiratory:  clear to auscultation bilaterally, normal work of breathing GI: soft, nontender, nondistended, + BS MS: no deformity or atrophy  Skin: warm and dry, no rash Neuro:  Strength and sensation are intact Psych: euthymic mood, full affect Vascular: Femoral pulse is normal on the right and absent on the left.  Distal pulses are also palpable on the right but not the left.  The left foot is not cold and there is normal color.  EKG:  EKG is  ordered today. EKG showed normal sinus rhythm with incomplete right bundle branch block.  Recent Labs: 08/27/2018: Hemoglobin 16.2; Platelets 185 11/02/2018: BUN 11; Creatinine, Ser 0.95; Potassium 4.2; Sodium 139    Lipid Panel    Component Value Date/Time   CHOL 126 12/26/2016 0833   CHOL 208 (H) 08/12/2016 0808   TRIG 91 12/26/2016 0833   HDL 22 (L) 12/26/2016 0833   HDL 25 (L) 08/12/2016 0808   CHOLHDL 5.7 12/26/2016 0833   VLDL 18 12/26/2016 1610  LDLCALC 86 12/26/2016 0833   LDLCALC 159 (H) 08/12/2016 0808      Wt Readings from Last 3 Encounters:  11/04/18 229 lb 8 oz (104.1 kg)  09/17/18 229 lb 4 oz (104 kg)  09/01/18 224 lb (101.6 kg)      No flowsheet data found.    ASSESSMENT AND PLAN:  1.  Peripheral arterial disease with acute occlusion of the left common iliac artery stent: Fortunately, he is not having rest pain but obviously, he is extremely limited by this.  In addition, I am concerned about the large thrombus with potential for embolization.  Left foot exam is unremarkable. The patient will require aortobifemoral bypass given that he has failed multiple endovascular intervention.  In order to do that, Plavix will have to be stopped.  His last dose was yesterday.  In doing so, there will be a risk of acute closure of the right common iliac artery stents.  Due to all of that, I think the best option is to admit him to the hospital and start him on heparin drip.  I will consult  vascular surgery with plans for aortobifemoral bypass likely early next week.   2. Coronary artery disease involving native coronary arteries without angina: Continue medical therapy.  The patient has not had any recent ischemic events and his functional capacity is very good before closure of the left iliac stent.  Thus, I do not think further ischemic work-up is needed before surgery.  3. Hyperlipidemia: Continue high-dose rosuvastatin.  He will need a follow-up lipid and liver profile.  4. Tobacco use: I again discussed with him the importance of smoking cessation.     Disposition:   The patient was [facility].  Signed,  Brandon BearsMuhammad Beckam Abdulaziz, MD  11/04/2018 9:32 AM    [facility]

## 2018-11-04 NOTE — ED Provider Notes (Addendum)
[facility] EMERGENCY DEPARTMENT Provider Note   CSN: [id number] Arrival date & time: 11/04/18  1300    History   Chief Complaint Chief Complaint  Patient presents with  . Circulatory Problem    HPI Brandon Parks is a 38 y.o. male.     38 y.o male with a PMH of CAD, GERD presents to the ED sent in by Dr. Kirke Corin for admission. Patient was seen by Dr. Kirke Corin this morning for left leg pain and numbness. Patient had an iliac stent place which likely failed.  Patient is currently on Plavix which needs to be stopped.  He last took his dose yesterday.  Dr. Kirke Corin recommended arthro-bifemoral bypass.  Patient arrived in the ED with complaints of left leg pain and numbness, worse around the foot area worse with ambulation.  No fevers, sick contacts, trauma.  The history is provided by the patient and medical records.    Past Medical History:  Diagnosis Date  . CAD (coronary artery disease)   . Carotid artery occlusion   . GERD (gastroesophageal reflux disease)   . MI (myocardial infarction) (HCC) 2013  . NSTEMI (non-ST elevated myocardial infarction) (HCC)   . Pneumothorax on left   . Tendinitis    bilateral    Patient Active Problem List   Diagnosis Date Noted  . PAD (peripheral artery disease) (HCC) 02/26/2015  . Tobacco use 02/26/2015  . Sternum pain 12/30/2012  . Hyperlipidemia 06/11/2012  . Essential hypertension 06/11/2012  . CAD (coronary artery disease) 11/28/2011  . 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 ABDOMINAL AORTOGRAM N/A 06/25/2016   Procedure: Abdominal Aortogram;  Surgeon: Iran Ouch, MD;  Location: [facility] INVASIVE CV LAB;  Service: Cardiovascular;  Laterality: N/A;  . ABDOMINAL AORTOGRAM W/LOWER EXTREMITY N/A 09/01/2018   Procedure: ABDOMINAL AORTOGRAM W/LOWER EXTREMITY;  Surgeon: Iran Ouch, MD;  Location: [facility] INVASIVE CV LAB;  Service: Cardiovascular;  Laterality: N/A;  .  APPENDECTOMY    . CARDIAC CATHETERIZATION  2013   @ [facility]; [place]  . CARDIAC CATHETERIZATION  Aug 2013   @ [facility]  . CORONARY ANGIOPLASTY  2013     . CORONARY ARTERY BYPASS GRAFT  2013   @ [facility]  . LOWER EXTREMITY ANGIOGRAPHY N/A 06/25/2016   Procedure: Lower Extremity Angiography;  Surgeon: Iran Ouch, MD;  Location: [facility] INVASIVE CV LAB;  Service: Cardiovascular;  Laterality: N/A;  Limited study  . PERIPHERAL VASCULAR CATHETERIZATION N/A 02/28/2015   Procedure: Abdominal Aortogram;  Surgeon: Iran Ouch, MD;  Location: [facility];  Service: Cardiovascular;  Laterality: N/A;  . PERIPHERAL VASCULAR INTERVENTION Bilateral 06/25/2016   Procedure: Peripheral Vascular Intervention;  Surgeon: Iran Ouch, MD;  Location: [facility] INVAS[facility] CV LAB;  Service: Cardiovascular;  Laterality: Bilateral;  Bilateral Kissing Iliac stents  . PERIPHERAL VASCULAR INTERVENTION Bilateral 09/01/2018   Procedure: PERIPHERAL VASCULAR INTERVENTION;  Surgeon: Iran Ouch, MD;  Location: [facility] INVAS[facility] CV LAB;  Service: Cardiovascular;  Laterality: Bilateral;  bilateral common iliacs        Home Medications    Prior to Admission medications   Medication Sig Start Date End Date Taking? Authorizing Provider  aspirin EC 81 MG tablet Take 1 tablet (81 mg total) by mouth daily. 08/17/18   Iran Ouch, MD  clopidogrel (PLAVIX) 75 MG tablet TAKE 1 TABLET BY MOUTH  DAILY 09/06/18   Iran Ouch, MD  MAGNESIUM PO Take 1  tablet by mouth daily.    [provider]  nitroGLYCERIN (NITROSTAT) 0.4 MG SL tablet Place 1 tablet (0.4 mg total) under the tongue every 5 (five) minutes as needed. 02/03/14   Antonieta IbaGollan, Timothy J, MD  rosuvastatin (CRESTOR) 40 MG tablet Take 1 tablet (40 mg total) by mouth daily. 08/17/18 11/15/18  Iran OuchArida, Muhammad A, MD    Family History Family History  Family history unknown: Yes    Social History Social History   Tobacco Use  . Smoking status: Current Every Day  Smoker    Packs/day: 0.25    Years: 7.00    Pack years: 1.75    Types: Cigarettes    Last attempt to quit: 11/09/2011    Years since quitting: 6.9  . Smokeless tobacco: Never Used  . Tobacco comment: 5-6 a day  Substance Use Topics  . Alcohol use: Yes    Alcohol/week: 2.0 standard drinks    Types: 2 Cans of beer per week    Comment: ocassionally  . Drug use: Not Currently    Types: Marijuana    Comment: past     Allergies   Patient has no known allergies.   Review of Systems Review of Systems  Constitutional: Negative for chills and fever.  HENT: Negative for ear pain and sore throat.   Eyes: Negative for pain and visual disturbance.  Respiratory: Negative for cough and shortness of breath.   Cardiovascular: Negative for chest pain and palpitations.  Gastrointestinal: Negative for abdominal pain and vomiting.  Genitourinary: Negative for dysuria and hematuria.  Musculoskeletal: Positive for myalgias. Negative for arthralgias and back pain.  Skin: Positive for color change. Negative for rash.  Neurological: Negative for seizures and syncope.  All other systems reviewed and are negative.    Physical Exam Updated Vital Signs BP 128/66   Pulse 75   Temp 97.8 F (36.6 C) (Oral)   Resp 19   SpO2 98%   Physical Exam Vitals signs and nursing note reviewed.  Constitutional:      Appearance: He is well-developed.     Comments: Non ill appearing.   HENT:     Head: Normocephalic and atraumatic.  Eyes:     General: No scleral icterus.    Pupils: Pupils are equal, round, and reactive to light.  Neck:     Musculoskeletal: Normal range of motion.  Cardiovascular:     Heart sounds: Normal heart sounds.  Pulmonary:     Effort: Pulmonary effort is normal.     Breath sounds: Normal breath sounds. No wheezing.     Comments: Lungs are clear to auscultation.  Chest:     Chest wall: No tenderness.  Abdominal:     General: Bowel sounds are normal. There is no distension.      Palpations: Abdomen is soft.     Tenderness: There is no abdominal tenderness. There is no right CVA tenderness or left CVA tenderness.     Comments: No distention, tenderness.   Musculoskeletal:        General: No tenderness or deformity.  Feet:     Right foot:     Skin integrity: No erythema.     Left foot:     Skin integrity: No erythema.     Comments: Diminished palpable PT pulse on left foot. Feet appear cold and pale.  Skin:    General: Skin is warm and dry.  Neurological:     Mental Status: He is alert and oriented to person, place, and  time.      ED Treatments / Results  Labs (all labs ordered are listed, but only abnormal results are displayed) Labs Reviewed  SARS CORONAVIRUS 2 (HOSPITAL ORDER, [place] [facility])  CBC WITH DIFFERENTIAL/PLATELET  PROTIME-INR  COMPREHENSIVE METABOLIC PANEL  URINALYSIS, ROUTINE W REFLEX MICROSCOPIC    EKG EKG Interpretation  Date/Time:  Thursday November 04 2018 13:10:56 EDT Ventricular Rate:  73 PR Interval:    QRS Duration: 107 QT Interval:  382 QTC Calculation: 421 R Axis:   102 Text Interpretation:  Sinus rhythm Probable right ventricular hypertrophy Abnrm T, consider ischemia, anterolateral lds Confirmed by Lennice Sites [phone]) on 11/04/2018 1:14:55 PM   Radiology Ct Angio Ao+bifem W & Or Wo Contrast  Result Date: 11/03/2018 CLINICAL DATA:  History of CAD and PAD. History of lower extremity stent placement. Patient now with left lower abdominal pain and left-sided leg numbness. EXAM: CT ANGIOGRAPHY OF ABDOMINAL AORTA WITH ILIOFEMORAL RUNOFF TECHNIQUE: Multidetector CT imaging of the abdomen, pelvis and lower extremities was performed using the standard protocol during bolus administration of intravenous contrast. Multiplanar CT image reconstructions and MIPs were obtained to evaluate the vascular anatomy. CONTRAST:  120 mL ISOVUE-370 IOPAMIDOL (ISOVUE-370) INJECTION 76% COMPARISON:  CT abdomen  pelvis-08/13/2004 FINDINGS: VASCULAR Aorta: There is a moderate amount of eccentric slightly irregular noncalcified atherosclerotic plaque within the distal aspect of the abdominal aorta, not resulting in hemodynamically significant stenosis. No abdominal aortic dissection. There is a minimal amount of ill-defined stranding about the distal most aspect of the abdominal aorta however this is favored to be secondary to presumably acute occlusion of the left common iliac arterial stent. Celiac: There is mild tortuosity involving the origin the celiac artery, not resulting in hemodynamically significant stenosis. Conventional branching pattern. SMA: Widely patent without hemodynamically significant narrowing. Conventional branching pattern. The distal tributaries of the SMA appear widely patent without discrete intraluminal filling defect. Renals: Solitary bilaterally. The bilateral renal arteries are widely patent without hemodynamically significant stenosis. No discrete areas of vessel irregularity to suggest FMD. IMA: Eccentric noncalcified atherosclerotic plaque involves the origin of the IMA approaching 50% luminal narrowing (representative image 65, series 5). _________________________________________________________ RIGHT Lower Extremity Inflow: Patient has undergone overlap stenting of the right common iliac artery to the level of the vessel's bifurcation. The overlapping stents appear widely patent without evidence of in stent stenosis. The right external and internal iliac arteries are of normal caliber and widely patent without hemodynamically significant stenosis. Outflow: The right common, deep and superficial femoral arteries are widely patent without hemodynamically significant narrowing. The right above and below-knee popliteal arteries are widely patent without hemodynamically significant narrowing. Runoff: 3 vessel runoff to the right lower leg and foot. Evaluation of the vasculature within the foot is  degraded secondary to patient motion. The right posterior tibial artery is patent to the level of the forefoot. A patent right-sided dorsalis pedis artery is not identified _________________________________________________________ LEFT Lower Extremity Inflow: There is complete occlusion of the left common iliac arterial stent with thrombus extending approximately 2.6 cm caudal to the distal landing zone of the stent (axial image 68, series 6). This finding is associated with adjacent perivascular stranding suggestive of an acute etiology. The left internal and external iliac arteries are of normal caliber and widely patent. Outflow: The left common, deep and superficial femoral arteries are widely patent without hemodynamically significant stenosis. The left above and below-knee popliteal arteries are widely patent without hemodynamically significant narrowing. Runoff: Three-vessel runoff to the left  ankle mortise. Evaluation of left foot is degraded secondary to patient motion however at least the left-sided posterior tibial artery is patent to the level of the forefoot. A left-sided dorsalis pedis artery is not identified. Given this limitation, there are no definitive discrete filling defects to suggest distal embolism though embolic occlusion of the left dorsalis pedis artery cannot be entirely excluded on the basis of this examination. Veins: The IVC and pelvic venous systems appear widely patent on this arterial phase examination. Surgical clips about the medial aspect of the left thigh likely represents the sequela of previous left greater saphenous venous harvesting. Review of the MIP images confirms the above findings. _________________________________________________________ NON-VASCULAR Evaluation of abdominal organs is limited to the arterial phase of enhancement. Lower chest: Limited visualization of the lower thorax demonstrates minimal dependent subpleural ground-glass atelectasis. No discrete focal  airspace opacities. Normal heart size.  No pericardial effusion. Hepatobiliary: Normal hepatic contour. There are 2 ill-defined hypoattenuating hepatic lesions with dominant lesion within the dome of the left lobe of the liver measuring 2.6 x 1.8 cm (image 16, series 5 an additional lesion within the anterior segment of the right lobe of the liver measuring 1.8 x 1.5 cm (image 20, series 5, both of which demonstrate peripheral interrupted nodular enhancement compatible with benign hepatic hemangiomas. No additional discrete hepatic lesions. Normal appearance of the gallbladder given degree distention. No radiopaque gallstones. No ascites. Pancreas: Normal appearance of the pancreas. Spleen: Normal appearance of the spleen. Adrenals/Urinary Tract: There is symmetric enhancement of the bilateral kidneys. No definite renal stones on this postcontrast examination. No discrete renal lesions. No urine obstruction or perinephric stranding. Normal appearance of the bilateral adrenal glands. The urinary bladder is underdistended. Stomach/Bowel: Moderate colonic stool burden without evidence of enteric obstruction. Post appendectomy. The bowel is normal in course and caliber without discrete area of wall thickening. No pneumoperitoneum or pneumatosis. Lymphatic: No bulky retroperitoneal, mesenteric, pelvic or inguinal lymphadenopathy. Reproductive: Dystrophic calcifications within normal sized prostate gland. No free fluid the pelvic cul-de-sac. Other: Tiny mesenteric fat containing periumbilical hernia. Musculoskeletal: No acute or aggressive osseous abnormalities. IMPRESSION: VASCULAR 1. Moderate amount of irregular noncalcified atherosclerotic plaque with the distal aspect of the abdominal aorta, not resulting in a hemodynamically significant stenosis. Left lower extremity vascular Impression: 1. Acute occlusion of the left common iliac arterial stent with occlusive thrombus extending approximately 2.6 cm caudal to the  distal landing zone of the stent with associated adjacent perivascular stranding. 2. No definitive evidence of distal embolism to the left lower extremity, though evaluation of the left foot is degraded secondary to patient motion and as there is age-indeterminate non visualization of a patent left-sided dorsalis pedis artery, embolic disease cannot be excluded on the basis of this examination. Right lower extremity vascular Impression: 1. Overlap stents within the right common iliac artery widely patent without evidence of in stent stenosis. 2. Three-vessel runoff to the right lower leg, however a patent right-sided dorsalis pedis artery is not identified. NON-VASCULAR 1. Incidentally noted benign hepatic hemangiomas. Critical Value/emergent results were called by telephone at the time of interpretation on 11/03/2018 at 2:29 pm to Dr. Lorine BearsMUHAMMAD ARIDA , who verbally acknowledged these results. Electronically Signed   By: Simonne ComeJohn  Watts M.D.   On: 11/03/2018 14:30    Procedures .Critical Care Performed by: Claude MangesSoto, Raahi Korber, PA-C Authorized by: Claude MangesSoto, Lorelle Macaluso, PA-C   Critical care provider statement:    Critical care time (minutes):  45   Critical care start time:  11/04/2018 2:00  PM   Critical care end time:  11/04/2018 2:45 PM   Critical care time was exclusive of:  Separately billable procedures and treating other patients   Critical care was necessary to treat or prevent imminent or life-threatening deterioration of the following conditions:  Circulatory failure   Critical care was time spent personally by me on the following activities:  Blood draw for specimens, development of treatment plan with patient or surrogate, discussions with consultants, evaluation of patient's response to treatment, examination of patient, obtaining history from patient or surrogate, ordering and performing treatments and interventions, ordering and review of laboratory studies, ordering and review of radiographic studies, pulse  oximetry, re-evaluation of patient's condition and review of old charts   (including critical care time)  Medications Ordered in ED Medications - No data to display   Initial Impression / Assessment and Plan / ED Course  I have reviewed the triage vital signs and the nursing notes.  Pertinent labs & imaging results that were available during my care of the patient were reviewed by me and considered in my medical decision making (see chart for details).   Patient with a past medical history of peripheral arterial disease and coronary artery disease.  Had a CABG in July for LAD plaque rupture with extensive thrombus and possible dissection.  Patient had right claudication back then, had a left common iliac artery stenosis.  Underwent bilateral common iliac artery kissing stent placement in 2018. Patient underwent CT angiogram yesterday which showed an acute occlusion of the left common iliac artery stent with occlusive thrombus extending 2.6 cm caudal to the stent.  The dorsalis pedis could not be well visualized on both sides.  Consultation was placed for vascular surgery, they have spoken to Dr. Robinson Sinkita.  This patient should be admitted via cardiology for a possible aortobifemoral procedure.  Patient is currently on Plavix, last took his dose yesterday.  Today endorses some pain along his leg, worse with ambulation.  CMP showed no electrolyte abnormality, creatinine level is within normal limits.  LFTs are unremarkable.  CBC showed no leukocytosis, hemoglobin is within normal limits.  PT and INR are within normal limits.  A COVID swab will be obtained prior to admission.   Spoke to [place] [facility] [place]ology, patient will be admitted under the cardiology service for further management along with vascular intervention in early next week.  Portions of this note were generated with Scientist, clinical (histocompatibility and immunogenetics)Dragon dictation software. Dictation errors may occur despite best attempts at proofreading.  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

## 2018-11-05 ENCOUNTER — Inpatient Hospital Stay (HOSPITAL_COMMUNITY): Payer: 59

## 2018-11-05 ENCOUNTER — Other Ambulatory Visit: Payer: Self-pay

## 2018-11-05 ENCOUNTER — Encounter (HOSPITAL_COMMUNITY): Payer: Self-pay | Admitting: General Practice

## 2018-11-05 DIAGNOSIS — I739 Peripheral vascular disease, unspecified: Secondary | ICD-10-CM

## 2018-11-05 DIAGNOSIS — Z01810 Encounter for preprocedural cardiovascular examination: Secondary | ICD-10-CM

## 2018-11-05 LAB — LIPID PANEL
Cholesterol: 120 mg/dL (ref 0–200)
HDL: 26 mg/dL — ABNORMAL LOW (ref >40)
LDL Cholesterol: 85 mg/dL (ref 0–99)
Total CHOL/HDL Ratio: 4.6 RATIO
Triglycerides: 47 mg/dL (ref <150)
VLDL: 9 mg/dL (ref 0–40)

## 2018-11-05 LAB — CBC
HCT: 45.4 % (ref 39.0–52.0)
Hemoglobin: 15 g/dL (ref 13.0–17.0)
MCH: 31.3 pg (ref 26.0–34.0)
MCHC: 33 g/dL (ref 30.0–36.0)
MCV: 94.8 fL (ref 80.0–100.0)
Platelets: 179 10*3/uL (ref 150–400)
RBC: 4.79 MIL/uL (ref 4.22–5.81)
RDW: 11.7 % (ref 11.5–15.5)
WBC: 6 10*3/uL (ref 4.0–10.5)
nRBC: 0 % (ref 0.0–0.2)

## 2018-11-05 LAB — BASIC METABOLIC PANEL
Anion gap: 8 (ref 5–15)
BUN: 13 mg/dL (ref 6–20)
CO2: 26 mmol/L (ref 22–32)
Calcium: 9.1 mg/dL (ref 8.9–10.3)
Chloride: 107 mmol/L (ref 98–111)
Creatinine, Ser: 1.06 mg/dL (ref 0.61–1.24)
GFR calc Af Amer: >60 mL/min (ref >60)
GFR calc non Af Amer: >60 mL/min (ref >60)
Glucose, Bld: 93 mg/dL (ref 70–99)
Potassium: 4.2 mmol/L (ref 3.5–5.1)
Sodium: 141 mmol/L (ref 135–145)

## 2018-11-05 LAB — HEPARIN LEVEL (UNFRACTIONATED): Heparin Unfractionated: 0.3 IU/mL (ref 0.30–0.70)

## 2018-11-05 LAB — HIV ANTIBODY (ROUTINE TESTING W REFLEX): HIV Screen 4th Generation wRfx: NONREACTIVE

## 2018-11-05 NOTE — Progress Notes (Addendum)
  Progress Note    11/05/2018 7:26 AM Hospital Day 1  Subjective:  No complaints of leg pain at rest  Afebrile   Vitals:   11/04/18 2004 11/05/18 0529  BP:  106/61  Pulse: 69 60  Resp:  13  Temp: 98.4 F (36.9 C) 97.6 F (36.4 C)  SpO2: 97% 96%    Physical Exam: General:  No distress Lungs:  Non laboared  CBC    Component Value Date/Time   WBC 6.0 11/05/2018 0354   RBC 4.79 11/05/2018 0354   HGB 15.0 11/05/2018 0354   HGB 16.8 06/20/2016 1429   HCT 45.4 11/05/2018 0354   HCT 47.0 06/20/2016 1429   PLT 179 11/05/2018 0354   PLT 167 06/20/2016 1429   MCV 94.8 11/05/2018 0354   MCV 87 06/20/2016 1429   MCV 92 11/20/2011 1404   MCH 31.3 11/05/2018 0354   MCHC 33.0 11/05/2018 0354   RDW 11.7 11/05/2018 0354   RDW 12.2 (L) 06/20/2016 1429   RDW 12.5 11/20/2011 1404   LYMPHSABS 1.7 11/04/2018 1335   MONOABS 0.9 11/04/2018 1335   EOSABS 0.2 11/04/2018 1335   BASOSABS 0.0 11/04/2018 1335    BMET    Component Value Date/Time   NA 141 11/05/2018 0354   NA 143 06/20/2016 1429   NA 140 11/20/2011 1404   K 4.2 11/05/2018 0354   K 4.0 11/20/2011 1404   CL 107 11/05/2018 0354   CL 104 11/20/2011 1404   CO2 26 11/05/2018 0354   CO2 29 11/20/2011 1404   GLUCOSE 93 11/05/2018 0354   GLUCOSE 142 (H) 11/20/2011 1404   BUN 13 11/05/2018 0354   BUN 13 06/20/2016 1429   BUN 14 11/20/2011 1404   CREATININE 1.06 11/05/2018 0354   CREATININE 1.40 (H) 11/20/2011 1404   CALCIUM 9.1 11/05/2018 0354   CALCIUM 9.2 11/20/2011 1404   GFRNONAA >60 11/05/2018 0354   GFRNONAA >60 11/20/2011 1404   GFRAA >60 11/05/2018 0354   GFRAA >60 11/20/2011 1404    INR    Component Value Date/Time   INR 1.1 11/04/2018 1335   INR 1.0 11/20/2011 1404     Intake/Output Summary (Last 24 hours) at 11/05/2018 0726 Last data filed at 11/05/2018 0450 Gross per 24 hour  Intake 994.19 ml  Output -  Net 994.19 ml     Assessment/Plan:  38 y.o. male with acute left iliac occlusion  Hospital Day 1  -pt continues to have no rest pain this morning -continue heparin -pt ordered npo by cardiology.  Unsure if he is going for procedure today with them.  He does not need to be npo from our standpoint.   -pt scheduled for carotid duplex  -for aortobifemoral bypass grafting on Monday with Dr. Donnetta Hutching.   Leontine Locket, PA-C [facility] [phone] 11/05/2018 7:26 AM    I agree with the above.  I have seen and evaluated the patient.  He continues to be without overt ischemia in his legs.  I discussed proceeding with aortobifemoral bypass graft.  The risk benefits the operation were discussed extensively with the patient including risk of death, bleeding, hernia, wound complications, sexual dysfunction and ultimate graft occlusion.  He understands Dr. Donnetta Hutching will do his operation on Monday.  Carotid Doppler studies were unremarkable today.  I stressed the importance of smoking cessation.  Annamarie Major

## 2018-11-05 NOTE — Progress Notes (Signed)
Carotid duplex       has been completed. Preliminary results can be found under CV proc through chart review. Devinn Hurwitz, BS, RDMS, RVT   

## 2018-11-05 NOTE — Progress Notes (Signed)
[place] for Heparin Indication: arterial occlusion of the left common iliac artery stent  No Known Allergies  Patient Measurements: Height: 6\' 2"  (188 cm) Weight: 222 lb 6.4 oz (100.9 kg) IBW/kg (Calculated) : 82.2 Heparin Dosing Weight: 103.2  Vital Signs: Temp: 98.7 F (37.1 C) (07/17 1252) Temp Source: Oral (07/17 1252) BP: 102/51 (07/17 1252) Pulse Rate: 69 (07/17 1252)  Labs: Recent Labs    11/04/18 1335 11/04/18 2119 11/05/18 0354  HGB 15.5  --  15.0  HCT 48.2  --  45.4  PLT 155  --  179  LABPROT 13.7  --   --   INR 1.1  --   --   HEPARINUNFRC  --  0.31 0.30  CREATININE 1.16  --  1.06    Estimated Creatinine Clearance: 119.9 mL/min (by C-G formula based on SCr of 1.06 mg/dL).   Medical History: Past Medical History:  Diagnosis Date  . CAD (coronary artery disease)   . Carotid artery occlusion   . GERD (gastroesophageal reflux disease)   . MI (myocardial infarction) ([place]) 2013  . NSTEMI (non-ST elevated myocardial infarction) ([place])   . PAD (peripheral artery disease) ([place])   . Pneumothorax on left   . Tendinitis    bilateral    Medications:  Infusions:  . heparin 1,700 Units/hr (11/05/18 0151)    Assessment: Pt is a 38YOM w/ c/o left leg pain and numbness. Pt has hx of PAD and is s/p multiple stent placements in both legs. Currently pt has acute occlusion of the left common iliac artery stent and is holding Plavix for surgery planned on Mon, 7/20. Pharmacy consulted to dose Heparin for anticoagulation.   Heparin level this morning is therapeutic however remains on the low end of the range (HL 0.3 << 0.31, goal of 0.3-0.7). CBC stable - no bleeding noted. Will increase slightly to keep within range.   Goal of Therapy:  Heparin level 0.3-0.7 units/ml Monitor platelets by anticoagulation protocol: Yes   Plan:  - Increase Heparin to 1800 units/hr (18 ml/hr) to keep within range - Will continue to monitor for  any signs/symptoms of bleeding and will follow up with heparin level in the a.m.   Thank you for allowing pharmacy to be a part of this patient's care.  Alycia Rossetti, PharmD, BCPS Clinical Pharmacist Clinical phone for 11/05/2018: [phone] 11/05/2018 1:34 PM   **Pharmacist phone directory can now be found on [email or web address] ([email or web address]).  Listed under [place].

## 2018-11-05 NOTE — Progress Notes (Addendum)
Progress Note  Patient Name: Brandon Parks Date of Encounter: 11/05/2018  Primary Cardiologist: Lorine BearsMuhammad Arida, MD   Subjective   No leg pain at rest.  No chest pain or SOB,  feels tired only.  Inpatient Medications    Scheduled Meds:  aspirin EC  81 mg Oral Daily   rosuvastatin  40 mg Oral Daily   Continuous Infusions:  heparin 1,700 Units/hr (11/05/18 0151)   PRN Meds: acetaminophen, ondansetron (ZOFRAN) IV   Vital Signs    Vitals:   11/04/18 1430 11/04/18 1524 11/04/18 2004 11/05/18 0529  BP: 120/78 131/75  106/61  Pulse: 68 68 69 60  Resp: (!) 22 (!) 21  13  Temp:  98.5 F (36.9 C) 98.4 F (36.9 C) 97.6 F (36.4 C)  TempSrc:  Oral Oral Oral  SpO2: 96% 98% 97% 96%  Weight:  104.1 kg  100.9 kg  Height:  6\' 2"  (1.88 m)  6\' 2"  (1.88 m)    Intake/Output Summary (Last 24 hours) at 11/05/2018 0859 Last data filed at 11/05/2018 0450 Gross per 24 hour  Intake 994.19 ml  Output --  Net 994.19 ml   Last 3 Weights 11/05/2018 11/04/2018 11/04/2018  Weight (lbs) 222 lb 6.4 oz 229 lb 8 oz 229 lb 8 oz  Weight (kg) 100.88 kg 104.1 kg 104.101 kg      Telemetry    SR to SB at 49  - Personally Reviewed  ECG    EKG today pending - Personally Reviewed  Physical Exam   GEN: No acute distress.   Neck: No JVD Cardiac: RRR, no murmurs, rubs, or gallops.  Respiratory: Clear to auscultation bilaterally. GI: Soft, nontender, non-distended  MS: No edema; No deformity. Rt post tib 2+; Lt I do not palpate  Neuro:  Nonfocal  Psych: Normal affect   Vascular Exam/Pulses:  Right Left  Radial 2+ (normal) 2+ (normal)  Ulnar Unable to palpate  Unable to palpate   Femoral 2+ (normal) absent  DP brisk absent  PT brisk monophasic     Labs    High Sensitivity Troponin:  No results for input(s): TROPONINIHS in the last 720 hours.    Cardiac EnzymesNo results for input(s): TROPONINI in the last 168 hours. No results for input(s): TROPIPOC in the last 168  hours.   Chemistry Recent Labs  Lab 11/02/18 0932 11/04/18 1335 11/05/18 0354  NA 139 140 141  K 4.2 4.6 4.2  CL 107 106 107  CO2 21* 24 26  GLUCOSE 98 91 93  BUN 11 13 13   CREATININE 0.95 1.16 1.06  CALCIUM 8.8* 9.2 9.1  PROT  --  7.2  --   ALBUMIN  --  3.9  --   AST  --  17  --   ALT  --  22  --   ALKPHOS  --  58  --   BILITOT  --  0.5  --   GFRNONAA >60 >60 >60  GFRAA >60 >60 >60  ANIONGAP 11 10 8      Hematology Recent Labs  Lab 11/04/18 1335 11/05/18 0354  WBC 8.3 6.0  RBC 4.93 4.79  HGB 15.5 15.0  HCT 48.2 45.4  MCV 97.8 94.8  MCH 31.4 31.3  MCHC 32.2 33.0  RDW 11.8 11.7  PLT 155 179    BNPNo results for input(s): BNP, PROBNP in the last 168 hours.   DDimer No results for input(s): DDIMER in the last 168 hours.   Radiology  Ct Angio Ao+bifem W & Or Wo Contrast  Result Date: 11/03/2018 CLINICAL DATA:  History of CAD and PAD. History of lower extremity stent placement. Patient now with left lower abdominal pain and left-sided leg numbness. EXAM: CT ANGIOGRAPHY OF ABDOMINAL AORTA WITH ILIOFEMORAL RUNOFF TECHNIQUE: Multidetector CT imaging of the abdomen, pelvis and lower extremities was performed using the standard protocol during bolus administration of intravenous contrast. Multiplanar CT image reconstructions and MIPs were obtained to evaluate the vascular anatomy. CONTRAST:  120 mL ISOVUE-370 IOPAMIDOL (ISOVUE-370) INJECTION 76% COMPARISON:  CT abdomen pelvis-08/13/2004 FINDINGS: VASCULAR Aorta: There is a moderate amount of eccentric slightly irregular noncalcified atherosclerotic plaque within the distal aspect of the abdominal aorta, not resulting in hemodynamically significant stenosis. No abdominal aortic dissection. There is a minimal amount of ill-defined stranding about the distal most aspect of the abdominal aorta however this is favored to be secondary to presumably acute occlusion of the left common iliac arterial stent. Celiac: There is mild  tortuosity involving the origin the celiac artery, not resulting in hemodynamically significant stenosis. Conventional branching pattern. SMA: Widely patent without hemodynamically significant narrowing. Conventional branching pattern. The distal tributaries of the SMA appear widely patent without discrete intraluminal filling defect. Renals: Solitary bilaterally. The bilateral renal arteries are widely patent without hemodynamically significant stenosis. No discrete areas of vessel irregularity to suggest FMD. IMA: Eccentric noncalcified atherosclerotic plaque involves the origin of the IMA approaching 50% luminal narrowing (representative image 65, series 5). _________________________________________________________ RIGHT Lower Extremity Inflow: Patient has undergone overlap stenting of the right common iliac artery to the level of the vessel's bifurcation. The overlapping stents appear widely patent without evidence of in stent stenosis. The right external and internal iliac arteries are of normal caliber and widely patent without hemodynamically significant stenosis. Outflow: The right common, deep and superficial femoral arteries are widely patent without hemodynamically significant narrowing. The right above and below-knee popliteal arteries are widely patent without hemodynamically significant narrowing. Runoff: 3 vessel runoff to the right lower leg and foot. Evaluation of the vasculature within the foot is degraded secondary to patient motion. The right posterior tibial artery is patent to the level of the forefoot. A patent right-sided dorsalis pedis artery is not identified _________________________________________________________ LEFT Lower Extremity Inflow: There is complete occlusion of the left common iliac arterial stent with thrombus extending approximately 2.6 cm caudal to the distal landing zone of the stent (axial image 68, series 6). This finding is associated with adjacent perivascular  stranding suggestive of an acute etiology. The left internal and external iliac arteries are of normal caliber and widely patent. Outflow: The left common, deep and superficial femoral arteries are widely patent without hemodynamically significant stenosis. The left above and below-knee popliteal arteries are widely patent without hemodynamically significant narrowing. Runoff: Three-vessel runoff to the left ankle mortise. Evaluation of left foot is degraded secondary to patient motion however at least the left-sided posterior tibial artery is patent to the level of the forefoot. A left-sided dorsalis pedis artery is not identified. Given this limitation, there are no definitive discrete filling defects to suggest distal embolism though embolic occlusion of the left dorsalis pedis artery cannot be entirely excluded on the basis of this examination. Veins: The IVC and pelvic venous systems appear widely patent on this arterial phase examination. Surgical clips about the medial aspect of the left thigh likely represents the sequela of previous left greater saphenous venous harvesting. Review of the MIP images confirms the above findings. _________________________________________________________ NON-VASCULAR Evaluation of abdominal  organs is limited to the arterial phase of enhancement. Lower chest: Limited visualization of the lower thorax demonstrates minimal dependent subpleural ground-glass atelectasis. No discrete focal airspace opacities. Normal heart size.  No pericardial effusion. Hepatobiliary: Normal hepatic contour. There are 2 ill-defined hypoattenuating hepatic lesions with dominant lesion within the dome of the left lobe of the liver measuring 2.6 x 1.8 cm (image 16, series 5 an additional lesion within the anterior segment of the right lobe of the liver measuring 1.8 x 1.5 cm (image 20, series 5, both of which demonstrate peripheral interrupted nodular enhancement compatible with benign hepatic  hemangiomas. No additional discrete hepatic lesions. Normal appearance of the gallbladder given degree distention. No radiopaque gallstones. No ascites. Pancreas: Normal appearance of the pancreas. Spleen: Normal appearance of the spleen. Adrenals/Urinary Tract: There is symmetric enhancement of the bilateral kidneys. No definite renal stones on this postcontrast examination. No discrete renal lesions. No urine obstruction or perinephric stranding. Normal appearance of the bilateral adrenal glands. The urinary bladder is underdistended. Stomach/Bowel: Moderate colonic stool burden without evidence of enteric obstruction. Post appendectomy. The bowel is normal in course and caliber without discrete area of wall thickening. No pneumoperitoneum or pneumatosis. Lymphatic: No bulky retroperitoneal, mesenteric, pelvic or inguinal lymphadenopathy. Reproductive: Dystrophic calcifications within normal sized prostate gland. No free fluid the pelvic cul-de-sac. Other: Tiny mesenteric fat containing periumbilical hernia. Musculoskeletal: No acute or aggressive osseous abnormalities. IMPRESSION: VASCULAR 1. Moderate amount of irregular noncalcified atherosclerotic plaque with the distal aspect of the abdominal aorta, not resulting in a hemodynamically significant stenosis. Left lower extremity vascular Impression: 1. Acute occlusion of the left common iliac arterial stent with occlusive thrombus extending approximately 2.6 cm caudal to the distal landing zone of the stent with associated adjacent perivascular stranding. 2. No definitive evidence of distal embolism to the left lower extremity, though evaluation of the left foot is degraded secondary to patient motion and as there is age-indeterminate non visualization of a patent left-sided dorsalis pedis artery, embolic disease cannot be excluded on the basis of this examination. Right lower extremity vascular Impression: 1. Overlap stents within the right common iliac artery  widely patent without evidence of in stent stenosis. 2. Three-vessel runoff to the right lower leg, however a patent right-sided dorsalis pedis artery is not identified. NON-VASCULAR 1. Incidentally noted benign hepatic hemangiomas. Critical Value/emergent results were called by telephone at the time of interpretation on 11/03/2018 at 2:29 pm to Dr. Lorine BearsMUHAMMAD ARIDA , who verbally acknowledged these results. Electronically Signed   By: Simonne ComeJohn  Watts M.D.   On: 11/03/2018 14:30    Cardiac Studies   Carotid dopplers today   Patient Profile     38 y.o. male with PAD and CAD with CABG 10/2011 with ostial LAD plaque rupture, extensive thrombus & possible dissection, tobacco use, HLD now admitted with severe claudication and CTA of lower ext with acute occlusion of the left common iliac artery stent with occlusive thrombus extending 2.6 cmcaudal to the stent. The dorsalis pedis could not be well visualized on both sides.  Vascular has seen yesterday.    Assessment & Plan    PAD with acute occlusion of Lt common iliac artery stent, no resting leg pain.  Large thrombus now on IV heparin.  Plan for surgery on Monday Vascular has seen.   CAD with CABG 2013 last nuc 2016 with no ischemia. Pt without recent ischemic events and good functional capacity per Dr. Kirke CorinArida no further ischemic work up is needed before surgery.  HLD on high dose rosuvastatin   Tobacco use smoking cessation has been discussed numerous times.    No test ordered today so will give diet.  For questions or updates, please contact [facility] Please consult [email or web address] for contact info under        Signed, Nada BoozerLaura Ingold, NP  11/05/2018, 8:59 AM    ---------------------------------------------------------------------------------------------   History and all data above reviewed.  Patient examined.  I agree with the findings as above.  Brandon CoyerRobert Nicholas Parks feels well and is anticipating aortobifemoral bypass Monday. Vascular  surgery has seen.   Constitutional: No acute distress Eyes: pupils equally round and reactive to light, sclera non-icteric, normal conjunctiva and lids ENMT: normal dentition, moist mucous membranes Cardiovascular: regular rhythm, normal rate, no murmurs. S1 and S2 normal. Radial pulses normal bilaterally. No jugular venous distention.  Respiratory: clear to auscultation bilaterally GI : normal bowel sounds, soft and nontender. No distention.   MSK: extremities warm, well perfused. No edema.  NEURO: grossly nonfocal exam, moves all extremities. PSYCH: alert and oriented x 3, normal mood and affect.   All available labs, radiology testing, previous records reviewed. Agree with documented assessment and plan of my colleague as stated above with the following additions or changes:  Principal Problem:   PAD (peripheral artery disease) (HCC) Active Problems:   CAD (coronary artery disease)   S/P CABG x 3   Hyperlipidemia   Essential hypertension    Plan: no change to current plan, will observe over weekend in anticipation of surgery.   Time Spent Directly with Patient:  I have spent a total of 25 minutes with the patient reviewing hospital notes, telemetry, EKGs, labs and examining the patient as well as establishing an assessment and plan that was discussed personally with the patient.  > 50% of time was spent in direct patient care.  Length of Stay:  LOS: 1 day   Parke PoissonGayatri A Aneesh Faller, MD [facility] 2:14 PM  11/05/2018

## 2018-11-06 LAB — CBC
HCT: 45.7 % (ref 39.0–52.0)
Hemoglobin: 15.1 g/dL (ref 13.0–17.0)
MCH: 31.1 pg (ref 26.0–34.0)
MCHC: 33 g/dL (ref 30.0–36.0)
MCV: 94 fL (ref 80.0–100.0)
Platelets: 192 10*3/uL (ref 150–400)
RBC: 4.86 MIL/uL (ref 4.22–5.81)
RDW: 11.6 % (ref 11.5–15.5)
WBC: 6.7 10*3/uL (ref 4.0–10.5)
nRBC: 0 % (ref 0.0–0.2)

## 2018-11-06 LAB — HEPARIN LEVEL (UNFRACTIONATED): Heparin Unfractionated: 0.35 IU/mL (ref 0.30–0.70)

## 2018-11-06 NOTE — Progress Notes (Signed)
[facility]  Subjective  - No complaints.  States his legs only hurt when walking.   Objective 101/76 67 98.1 F (36.7 C) (Oral) 17 98%  Intake/Output Summary (Last 24 hours) at 11/06/2018 0925 Last data filed at 11/06/2018 0500 Gross per 24 hour  Intake 1121.14 ml  Output -  Net 1121.14 ml    Feet warm, no tissue loss  Laboratory Lab Results: Recent Labs    11/05/18 0354 11/06/18 0249  WBC 6.0 6.7  HGB 15.0 15.1  HCT 45.4 45.7  PLT 179 192   BMET Recent Labs    11/04/18 1335 11/05/18 0354  NA 140 141  K 4.6 4.2  CL 106 107  CO2 24 26  GLUCOSE 91 93  BUN 13 13  CREATININE 1.16 1.06  CALCIUM 9.2 9.1    COAG Lab Results  Component Value Date   INR 1.1 11/04/2018   INR 1.0 06/20/2016   INR 1.0 02/26/2015   No results found for: PTT  Assessment/Planning:  Plan for aortobifemoral bypass with Dr. Donnetta Hutching on Monday.  Needs to be NPO after midnight Sunday night.  Answered all his remaining questions.  Discussed the importance of smoking cessation.  Marty Heck 11/06/2018 9:25 AM --

## 2018-11-06 NOTE — Progress Notes (Signed)
Progress Note  Patient Name: Brandon CoyerRobert Nicholas Parks Date of Encounter: 11/06/2018  Primary Cardiologist: Lorine BearsMuhammad Arida, MD  Subjective   No complaints  Inpatient Medications    Scheduled Meds:  aspirin EC  81 mg Oral Daily   rosuvastatin  40 mg Oral Daily   Continuous Infusions:  heparin 1,800 Units/hr (11/06/18 0830)   PRN Meds: acetaminophen, ondansetron (ZOFRAN) IV   Vital Signs    Vitals:   11/05/18 1252 11/05/18 1946 11/06/18 0601 11/06/18 0834  BP: (!) 102/51 110/75 101/67 101/76  Pulse: 69 (!) 57 62 67  Resp: 18 15 15 17   Temp: 98.7 F (37.1 C) 98.7 F (37.1 C) 97.6 F (36.4 C) 98.1 F (36.7 C)  TempSrc: Oral Oral Oral Oral  SpO2: 98% 100% 97% 98%  Weight:   102.7 kg   Height:        Intake/Output Summary (Last 24 hours) at 11/06/2018 0919 Last data filed at 11/06/2018 0500 Gross per 24 hour  Intake 1121.14 ml  Output --  Net 1121.14 ml   Last 3 Weights 11/06/2018 11/05/2018 11/04/2018  Weight (lbs) 226 lb 8 oz 222 lb 6.4 oz 229 lb 8 oz  Weight (kg) 102.74 kg 100.88 kg 104.1 kg      Telemetry    NSR - Personally Reviewed  ECG    n/a - Personally Reviewed  Physical Exam   GEN: No acute distress.   Neck: No JVD Cardiac: RRR, no murmurs, rubs, or gallops.  Respiratory: Clear to auscultation bilaterally. GI: Soft, nontender, non-distended  MS: No edema; No deformity. Neuro:  Nonfocal  Psych: Normal affect   Labs    High Sensitivity Troponin:  No results for input(s): TROPONINIHS in the last 720 hours.    Cardiac EnzymesNo results for input(s): TROPONINI in the last 168 hours. No results for input(s): TROPIPOC in the last 168 hours.   Chemistry Recent Labs  Lab 11/02/18 0932 11/04/18 1335 11/05/18 0354  NA 139 140 141  K 4.2 4.6 4.2  CL 107 106 107  CO2 21* 24 26  GLUCOSE 98 91 93  BUN 11 13 13   CREATININE 0.95 1.16 1.06  CALCIUM 8.8* 9.2 9.1  PROT  --  7.2  --   ALBUMIN  --  3.9  --   AST  --  17  --   ALT  --  22  --    ALKPHOS  --  58  --   BILITOT  --  0.5  --   GFRNONAA >60 >60 >60  GFRAA >60 >60 >60  ANIONGAP 11 10 8      Hematology Recent Labs  Lab 11/04/18 1335 11/05/18 0354 11/06/18 0249  WBC 8.3 6.0 6.7  RBC 4.93 4.79 4.86  HGB 15.5 15.0 15.1  HCT 48.2 45.4 45.7  MCV 97.8 94.8 94.0  MCH 31.4 31.3 31.1  MCHC 32.2 33.0 33.0  RDW 11.8 11.7 11.6  PLT 155 179 192    BNPNo results for input(s): BNP, PROBNP in the last 168 hours.   DDimer No results for input(s): DDIMER in the last 168 hours.   Radiology    [place] Carotid  Result Date: 11/05/2018 Carotid Arterial Duplex Study Indications:       Pre op ABG. Risk Factors:      Coronary artery disease. Other Factors:     Hx CABG. Comparison Study:  no prior Performing Technologist: Jeb LeveringJill Parker RDMS, RVT  Examination Guidelines: A complete evaluation includes B-mode imaging, spectral Doppler, color  Doppler, and power Doppler as needed of all accessible portions of each vessel. Bilateral testing is considered an integral part of a complete examination. Limited examinations for reoccurring indications may be performed as noted.  Right Carotid Findings: +----------+--------+--------+--------+--------+--------+             PSV cm/s EDV cm/s Stenosis Describe Comments  +----------+--------+--------+--------+--------+--------+  CCA Prox   90       21                                   +----------+--------+--------+--------+--------+--------+  CCA Distal 75       14                                   +----------+--------+--------+--------+--------+--------+  ICA Prox   83       26       Normal                      +----------+--------+--------+--------+--------+--------+  ICA Distal 64       24                                   +----------+--------+--------+--------+--------+--------+  ECA        76       14                                   +----------+--------+--------+--------+--------+--------+  +----------+--------+-------+----------------+-------------------+             PSV cm/s EDV cms Describe         Arm Pressure (mmHG)  +----------+--------+-------+----------------+-------------------+  Subclavian 106              Multiphasic, WNL                      +----------+--------+-------+----------------+-------------------+ +---------+--------+--+--------+--+---------+  Vertebral PSV cm/s 68 EDV cm/s 18 Antegrade  +---------+--------+--+--------+--+---------+  Left Carotid Findings: +----------+--------+--------+--------+--------+--------+             PSV cm/s EDV cm/s Stenosis Describe Comments  +----------+--------+--------+--------+--------+--------+  CCA Prox   133      30                                   +----------+--------+--------+--------+--------+--------+  CCA Distal 111      30                                   +----------+--------+--------+--------+--------+--------+  ICA Prox   89       35       Normal                      +----------+--------+--------+--------+--------+--------+  ICA Distal 91       33                                   +----------+--------+--------+--------+--------+--------+  ECA        102      10                                   +----------+--------+--------+--------+--------+--------+ +----------+--------+--------+----------------+-------------------+  Subclavian PSV cm/s EDV cm/s Describe         Arm Pressure (mmHG)  +----------+--------+--------+----------------+-------------------+             173               Multiphasic, WNL                      +----------+--------+--------+----------------+-------------------+ +---------+--------+--+--------+--+---------+  Vertebral PSV cm/s 52 EDV cm/s 14 Antegrade  +---------+--------+--+--------+--+---------+  Summary: Right Carotid: The extracranial vessels were near-normal with only minimal wall                thickening or plaque. Left Carotid: The extracranial vessels were near-normal with only minimal wall                thickening or plaque.  *See table(s) above for measurements and observations.  Electronically signed by Monica Martinez MD on 11/05/2018 at 1:32:48 PM.    Final     Cardiac Studies    Patient Profile     38 y.o. male with PAD and CAD with CABG 10/2011 with ostial LAD plaque rupture, extensive thrombus & possible dissection, tobacco use, HLD now admitted with severe claudication and CTA of lower ext with acute occlusion of the left common iliac artery stent with occlusive thrombus extending 2.6 cmcaudal to the stent. The dorsalis pedis could not be well visualized on both sides.  Vascular has seen yesterday.    Assessment & Plan    1. PAD - admitted with acute occlusion of left common iliac artery stent seen by CTA - on IV heparin for large thrombus - vascular following, plans for surgery aortibifemoral bypass grafting Monday - continue anticoag over the weekend. He is on ASA/statin, postop could consider low dose ACE-I pending hemodynamics.  - no current leg pains  2. CAD - prior CABG in 2016 - nuclear stress 2016 without ischemia -  Pt without recent ischemic events and good functional capacity per Dr. Fletcher Anon no further ischemic work up is needed before surgery. - continue ASA/statin.    For questions or updates, please contact [place] Please consult [email or web address] for contact info under        Signed, Carlyle Dolly, MD  11/06/2018, 9:19 AM

## 2018-11-07 DIAGNOSIS — Z951 Presence of aortocoronary bypass graft: Secondary | ICD-10-CM

## 2018-11-07 DIAGNOSIS — I10 Essential (primary) hypertension: Secondary | ICD-10-CM

## 2018-11-07 DIAGNOSIS — I2510 Atherosclerotic heart disease of native coronary artery without angina pectoris: Secondary | ICD-10-CM

## 2018-11-07 LAB — SURGICAL PCR SCREEN
MRSA, PCR: NEGATIVE
Staphylococcus aureus: POSITIVE — AB

## 2018-11-07 LAB — CBC
HCT: 46.9 % (ref 39.0–52.0)
Hemoglobin: 15.4 g/dL (ref 13.0–17.0)
MCH: 31.2 pg (ref 26.0–34.0)
MCHC: 32.8 g/dL (ref 30.0–36.0)
MCV: 94.9 fL (ref 80.0–100.0)
Platelets: 205 10*3/uL (ref 150–400)
RBC: 4.94 MIL/uL (ref 4.22–5.81)
RDW: 11.6 % (ref 11.5–15.5)
WBC: 5.5 10*3/uL (ref 4.0–10.5)
nRBC: 0 % (ref 0.0–0.2)

## 2018-11-07 LAB — HEPARIN LEVEL (UNFRACTIONATED): Heparin Unfractionated: 0.43 IU/mL (ref 0.30–0.70)

## 2018-11-07 NOTE — Progress Notes (Signed)
[facility] [place]  Subjective  - No complaints.     Objective 96/64 61 98.4 F (36.9 C) (Oral) 16 98%  Intake/Output Summary (Last 24 hours) at 11/07/2018 1019 Last data filed at 11/06/2018 1900 Gross per 24 hour  Intake 612.11 ml  Output -  Net 612.11 ml    Feet warm, no tissue loss  Laboratory Lab Results: Recent Labs    11/06/18 0249 11/07/18 0809  WBC 6.7 5.5  HGB 15.1 15.4  HCT 45.7 46.9  PLT 192 205   BMET Recent Labs    11/04/18 1335 11/05/18 0354  NA 140 141  K 4.6 4.2  CL 106 107  CO2 24 26  GLUCOSE 91 93  BUN 13 13  CREATININE 1.16 1.06  CALCIUM 9.2 9.1    COAG Lab Results  Component Value Date   INR 1.1 11/04/2018   INR 1.0 06/20/2016   INR 1.0 02/26/2015   No results found for: PTT  Assessment/Planning:  Plan for aortobifemoral bypass with Dr. Early tomorrow.  Needs to be NPO after midnight tonight.  Answered all his remaining questions.  Discussed the importance of smoking cessation.  Consent order placed in chart.  Cleared by cardiology.  Lifestyle limiting claudication with occluded iliac stents.  Han Lysne J Artrice Kraker 11/07/2018 10:19 AM --   

## 2018-11-07 NOTE — Progress Notes (Addendum)
Progress Note  Patient Name: Brandon Parks Date of Encounter: 11/07/2018  Primary Cardiologist: Kathlyn Sacramento, MD  Subjective   No issues overnight -plan for aortofemoral bypass on Monday.   Inpatient Medications    Scheduled Meds: . aspirin EC  81 mg Oral Daily  . rosuvastatin  40 mg Oral Daily   Continuous Infusions: . heparin 1,800 Units/hr (11/06/18 1900)   PRN Meds: acetaminophen, ondansetron (ZOFRAN) IV   Vital Signs    Vitals:   11/06/18 0601 11/06/18 0834 11/06/18 1944 11/07/18 0506  BP: 101/67 101/76 122/64 96/64  Pulse: 62 67 (!) 56 61  Resp: 15 17 16 16   Temp: 97.6 F (36.4 C) 98.1 F (36.7 C) 98.7 F (37.1 C) 98.4 F (36.9 C)  TempSrc: Oral Oral Oral Oral  SpO2: 97% 98% 98% 98%  Weight: 102.7 kg   102.4 kg  Height:        Intake/Output Summary (Last 24 hours) at 11/07/2018 0923 Last data filed at 11/06/2018 1900 Gross per 24 hour  Intake 612.11 ml  Output -  Net 612.11 ml   Last 3 Weights 11/07/2018 11/06/2018 11/05/2018  Weight (lbs) 225 lb 11.2 oz 226 lb 8 oz 222 lb 6.4 oz  Weight (kg) 102.377 kg 102.74 kg 100.88 kg      Telemetry    NSR - Personally Reviewed  ECG    n/a - Personally Reviewed  Physical Exam   GEN: No acute distress.   Neck: No JVD Cardiac: RRR, no murmurs, rubs, or gallops.  Respiratory: Clear to auscultation bilaterally. GI: Soft, nontender, non-distended  MS: No edema; No deformity. Neuro:  Nonfocal  Psych: Normal affect   Labs    High Sensitivity Troponin:  No results for input(s): TROPONINIHS in the last 720 hours.    Cardiac EnzymesNo results for input(s): TROPONINI in the last 168 hours. No results for input(s): TROPIPOC in the last 168 hours.   Chemistry Recent Labs  Lab 11/02/18 0932 11/04/18 1335 11/05/18 0354  NA 139 140 141  K 4.2 4.6 4.2  CL 107 106 107  CO2 21* 24 26  GLUCOSE 98 91 93  BUN 11 13 13   CREATININE 0.95 1.16 1.06  CALCIUM 8.8* 9.2 9.1  PROT  --  7.2  --   ALBUMIN   --  3.9  --   AST  --  17  --   ALT  --  22  --   ALKPHOS  --  58  --   BILITOT  --  0.5  --   GFRNONAA >60 >60 >60  GFRAA >60 >60 >60  ANIONGAP 11 10 8      Hematology Recent Labs  Lab 11/04/18 1335 11/05/18 0354 11/06/18 0249  WBC 8.3 6.0 6.7  RBC 4.93 4.79 4.86  HGB 15.5 15.0 15.1  HCT 48.2 45.4 45.7  MCV 97.8 94.8 94.0  MCH 31.4 31.3 31.1  MCHC 32.2 33.0 33.0  RDW 11.8 11.7 11.6  PLT 155 179 192    BNPNo results for input(s): BNP, PROBNP in the last 168 hours.   DDimer No results for input(s): DDIMER in the last 168 hours.   Radiology    No results found.  Cardiac Studies    Patient Profile     38 y.o. male with PAD and CAD with CABG 10/2011 with ostial LAD plaque rupture, extensive thrombus & possible dissection, tobacco use, HLD now admitted with severe claudication and CTA of lower ext with acute occlusion of the left  common iliac artery stent with occlusive thrombus extending 2.6 cmcaudal to the stent. The dorsalis pedis could not be well visualized on both sides.  Vascular has seen yesterday.    Assessment & Plan    1. PAD - admitted with acute occlusion of left common iliac artery stent seen by CTA - on IV heparin for large thrombus - vascular following, plans for surgery aortibifemoral bypass grafting Monday - continue anticoag over the weekend. He is on ASA/statin, postop could consider low dose ACE-I pending hemodynamics.  - no current leg pains  2. CAD - prior CABG in 2016 - nuclear stress 2016 without ischemia -  Pt without recent ischemic events and good functional capacity per Dr. Kirke CorinArida no further ischemic work up is needed before surgery. - continue ASA/statin.    Given early onset CAD/PAD without significant dyslipidemia and signs of thrombosis, would check LP(a) as possible additional risk factor.   For questions or updates, please contact [facility] Please consult [email or web address] for contact info under   Chrystie NoseKenneth C. Hilty, MD, Milagros LollFACC,  FACP    [facility]  Medical Director of the [facility]  [facility] Diplomate of the American Board of Clinical Lipidology Attending Cardiologist  Direct Dial: [phone]  Fax: [phone]  Website:  [email or web address]  Chrystie NoseKenneth C Hilty, MD  11/07/2018, 9:23 AM

## 2018-11-07 NOTE — Progress Notes (Signed)
[place] for Heparin Indication: arterial occlusion of the left common iliac artery stent  No Known Allergies  Patient Measurements: Height: 6\' 2"  (188 cm) Weight: 225 lb 11.2 oz (102.4 kg)(scale A) IBW/kg (Calculated) : 82.2 Heparin Dosing Weight: 103.2  Vital Signs: Temp: 98.4 F (36.9 C) (07/19 0506) Temp Source: Oral (07/19 0506) BP: 96/64 (07/19 0506) Pulse Rate: 61 (07/19 0506)  Labs: Recent Labs    11/04/18 1335  11/05/18 0354 11/06/18 0249 11/07/18 0809  HGB 15.5  --  15.0 15.1 15.4  HCT 48.2  --  45.4 45.7 46.9  PLT 155  --  179 192 205  LABPROT 13.7  --   --   --   --   INR 1.1  --   --   --   --   HEPARINUNFRC  --    < > 0.30 0.35 0.43  CREATININE 1.16  --  1.06  --   --    < > = values in this interval not displayed.    Estimated Creatinine Clearance: 120.7 mL/min (by C-G formula based on SCr of 1.06 mg/dL).   Medical History: Past Medical History:  Diagnosis Date  . CAD (coronary artery disease)   . Carotid artery occlusion   . GERD (gastroesophageal reflux disease)   . MI (myocardial infarction) ([place]) 2013  . NSTEMI (non-ST elevated myocardial infarction) ([place])   . PAD (peripheral artery disease) ([place])   . Pneumothorax on left   . Tendinitis    bilateral    Medications:  Infusions:  . heparin 1,800 Units/hr (11/06/18 1900)    Assessment: Pt is a 38YOM w/ c/o left leg pain and numbness. Pt has hx of PAD and is s/p multiple stent placements in both legs. Currently pt has acute occlusion of the left common iliac artery stent and is holding Plavix for surgery planned on Mon, 7/20. Pharmacy consulted to dose Heparin for anticoagulation.   Heparin level is therapeutic at 0.43. CBC stable - no bleeding noted.   Goal of Therapy:  Heparin level 0.3-0.7 units/ml Monitor platelets by anticoagulation protocol: Yes   Plan:  - Continue Heparin 1800 units/hr  - Daily HL and CBC - Monitor for s/s of  bleeding  Thank you for allowing pharmacy to be a part of this patient's care.  Lorel Monaco, PharmD PGY1 Ambulatory Care Resident [employer] # [phone]

## 2018-11-07 NOTE — Anesthesia Preprocedure Evaluation (Addendum)
Anesthesia Evaluation  Patient identified by MRN, date of birth, ID band Patient awake    Reviewed: Allergy & Precautions, NPO status , Patient's Chart, lab work & pertinent test results  Airway Mallampati: II  TM Distance: >3 FB Neck ROM: Full    Dental no notable dental hx.    Pulmonary Current Smoker,    Pulmonary exam normal breath sounds clear to auscultation       Cardiovascular hypertension, + CAD, + Past MI, + CABG and + Peripheral Vascular Disease  Normal cardiovascular exam Rhythm:Regular Rate:Normal     Neuro/Psych negative neurological ROS  negative psych ROS   GI/Hepatic negative GI ROS, Neg liver ROS,   Endo/Other  negative endocrine ROS  Renal/GU negative Renal ROS     Musculoskeletal negative musculoskeletal ROS (+)   Abdominal   Peds  Hematology HLD   Anesthesia Other Findings PERIPHERAL VASCULAR DISEASE WITH CLAUDICATION  Reproductive/Obstetrics                            Anesthesia Physical Anesthesia Plan  ASA: IV  Anesthesia Plan: General   Post-op Pain Management:    Induction: Intravenous  PONV Risk Score and Plan: 1 and Ondansetron, Dexamethasone, Midazolam and Treatment may vary due to age or medical condition  Airway Management Planned: Oral ETT  Additional Equipment: Arterial line, CVP and Ultrasound Guidance Line Placement  Intra-op Plan:   Post-operative Plan: Extubation in OR  Informed Consent: I have reviewed the patients History and Physical, chart, labs and discussed the procedure including the risks, benefits and alternatives for the proposed anesthesia with the patient or authorized representative who has indicated his/her understanding and acceptance.     Dental advisory given  Plan Discussed with: CRNA  Anesthesia Plan Comments:        Anesthesia Quick Evaluation

## 2018-11-07 NOTE — H&P (View-Only) (Signed)
[facility]  Subjective  - No complaints.     Objective 96/64 61 98.4 F (36.9 C) (Oral) 16 98%  Intake/Output Summary (Last 24 hours) at 11/07/2018 1019 Last data filed at 11/06/2018 1900 Gross per 24 hour  Intake 612.11 ml  Output -  Net 612.11 ml    Feet warm, no tissue loss  Laboratory Lab Results: Recent Labs    11/06/18 0249 11/07/18 0809  WBC 6.7 5.5  HGB 15.1 15.4  HCT 45.7 46.9  PLT 192 205   BMET Recent Labs    11/04/18 1335 11/05/18 0354  NA 140 141  K 4.6 4.2  CL 106 107  CO2 24 26  GLUCOSE 91 93  BUN 13 13  CREATININE 1.16 1.06  CALCIUM 9.2 9.1    COAG Lab Results  Component Value Date   INR 1.1 11/04/2018   INR 1.0 06/20/2016   INR 1.0 02/26/2015   No results found for: PTT  Assessment/Planning:  Plan for aortobifemoral bypass with Dr. Donnetta Hutching tomorrow.  Needs to be NPO after midnight tonight.  Answered all his remaining questions.  Discussed the importance of smoking cessation.  Consent order placed in chart.  Cleared by cardiology.  Lifestyle limiting claudication with occluded iliac stents.  Brandon Parks 11/07/2018 10:19 AM --

## 2018-11-08 ENCOUNTER — Inpatient Hospital Stay (HOSPITAL_COMMUNITY): Payer: 59

## 2018-11-08 ENCOUNTER — Encounter (HOSPITAL_COMMUNITY): Payer: Self-pay | Admitting: Certified Registered Nurse Anesthetist

## 2018-11-08 ENCOUNTER — Inpatient Hospital Stay (HOSPITAL_COMMUNITY): Payer: 59 | Admitting: Anesthesiology

## 2018-11-08 ENCOUNTER — Encounter (HOSPITAL_COMMUNITY)
Admission: EM | Disposition: A | Discharge status: Home / Self Care | Payer: Self-pay | Source: Home / Self Care | Attending: Vascular Surgery

## 2018-11-08 DIAGNOSIS — I70212 Atherosclerosis of native arteries of extremities with intermittent claudication, left leg: Secondary | ICD-10-CM

## 2018-11-08 HISTORY — PX: AORTA - BILATERAL FEMORAL ARTERY BYPASS GRAFT: SHX1175

## 2018-11-08 LAB — POCT I-STAT 7, (LYTES, BLD GAS, ICA,H+H)
Acid-base deficit: 1 mmol/L (ref 0.0–2.0)
Bicarbonate: 24.9 mmol/L (ref 20.0–28.0)
Bicarbonate: 24.6 mmol/L (ref 20.0–28.0)
Calcium, Ion: 1.23 mmol/L (ref 1.15–1.40)
Calcium, Ion: 1.23 mmol/L (ref 1.15–1.40)
HCT: 39 % (ref 39.0–52.0)
HCT: 39 % (ref 39.0–52.0)
Hemoglobin: 13.3 g/dL (ref 13.0–17.0)
Hemoglobin: 13.3 g/dL (ref 13.0–17.0)
O2 Saturation: 100 %
O2 Saturation: 99 %
Patient temperature: 35.7
Patient temperature: 36.6
Potassium: 4.7 mmol/L (ref 3.5–5.1)
Potassium: 5.2 mmol/L — ABNORMAL HIGH (ref 3.5–5.1)
Sodium: 140 mmol/L (ref 135–145)
Sodium: 137 mmol/L (ref 135–145)
TCO2: 26 mmol/L (ref 22–32)
TCO2: 26 mmol/L (ref 22–32)
pCO2 arterial: 39.9 mmHg (ref 32.0–48.0)
pCO2 arterial: 44.7 mmHg (ref 32.0–48.0)
pH, Arterial: 7.397 (ref 7.350–7.450)
pH, Arterial: 7.348 — ABNORMAL LOW (ref 7.350–7.450)
pO2, Arterial: 210 mmHg — ABNORMAL HIGH (ref 83.0–108.0)
pO2, Arterial: 160 mmHg — ABNORMAL HIGH (ref 83.0–108.0)

## 2018-11-08 LAB — BASIC METABOLIC PANEL
Anion gap: 8 (ref 5–15)
BUN: 10 mg/dL (ref 6–20)
CO2: 22 mmol/L (ref 22–32)
Calcium: 8.7 mg/dL — ABNORMAL LOW (ref 8.9–10.3)
Chloride: 107 mmol/L (ref 98–111)
Creatinine, Ser: 1.04 mg/dL (ref 0.61–1.24)
GFR calc Af Amer: >60 mL/min (ref >60)
GFR calc non Af Amer: >60 mL/min (ref >60)
Glucose, Bld: 146 mg/dL — ABNORMAL HIGH (ref 70–99)
Potassium: 4.1 mmol/L (ref 3.5–5.1)
Sodium: 137 mmol/L (ref 135–145)

## 2018-11-08 LAB — BPAM RBC
Blood Product Expiration Date: 202008062359
Blood Product Expiration Date: 202008062359
Blood Product Expiration Date: 202008082359
Blood Product Expiration Date: 202008112359
ISSUE DATE / TIME: 202007201048
ISSUE DATE / TIME: 202007201048
ISSUE DATE / TIME: 202007201048
ISSUE DATE / TIME: 202007201048
Unit Type and Rh: 6200
Unit Type and Rh: 6200
Unit Type and Rh: 6200
Unit Type and Rh: 6200

## 2018-11-08 LAB — BLOOD GAS, ARTERIAL
Acid-base deficit: 1.5 mmol/L (ref 0.0–2.0)
Bicarbonate: 22.9 mmol/L (ref 20.0–28.0)
FIO2: 28
O2 Saturation: 98.3 %
Patient temperature: 97
pCO2 arterial: 37.8 mmHg (ref 32.0–48.0)
pH, Arterial: 7.394 (ref 7.350–7.450)
pO2, Arterial: 121 mmHg — ABNORMAL HIGH (ref 83.0–108.0)

## 2018-11-08 LAB — TYPE AND SCREEN
ABO/RH(D): A POS
Antibody Screen: NEGATIVE
Unit division: 0
Unit division: 0
Unit division: 0
Unit division: 0

## 2018-11-08 LAB — CBC
HCT: 44.3 % (ref 39.0–52.0)
HCT: 40.7 % (ref 39.0–52.0)
Hemoglobin: 15.1 g/dL (ref 13.0–17.0)
Hemoglobin: 13.6 g/dL (ref 13.0–17.0)
MCH: 31.1 pg (ref 26.0–34.0)
MCH: 31.2 pg (ref 26.0–34.0)
MCHC: 34.1 g/dL (ref 30.0–36.0)
MCHC: 33.4 g/dL (ref 30.0–36.0)
MCV: 91.3 fL (ref 80.0–100.0)
MCV: 93.3 fL (ref 80.0–100.0)
Platelets: 206 10*3/uL (ref 150–400)
Platelets: 125 10*3/uL — ABNORMAL LOW (ref 150–400)
RBC: 4.85 MIL/uL (ref 4.22–5.81)
RBC: 4.36 MIL/uL (ref 4.22–5.81)
RDW: 11.4 % — ABNORMAL LOW (ref 11.5–15.5)
RDW: 11.4 % — ABNORMAL LOW (ref 11.5–15.5)
WBC: 5.7 10*3/uL (ref 4.0–10.5)
WBC: 11.6 10*3/uL — ABNORMAL HIGH (ref 4.0–10.5)
nRBC: 0 % (ref 0.0–0.2)
nRBC: 0 % (ref 0.0–0.2)

## 2018-11-08 LAB — ABO/RH: ABO/RH(D): A POS

## 2018-11-08 LAB — PREPARE RBC (CROSSMATCH)

## 2018-11-08 LAB — APTT: aPTT: 24 seconds (ref 24–36)

## 2018-11-08 LAB — PROTIME-INR
INR: 1.2 (ref 0.8–1.2)
Prothrombin Time: 14.6 seconds (ref 11.4–15.2)

## 2018-11-08 LAB — HEPARIN LEVEL (UNFRACTIONATED): Heparin Unfractionated: 0.47 IU/mL (ref 0.30–0.70)

## 2018-11-08 LAB — MAGNESIUM: Magnesium: 1.5 mg/dL — ABNORMAL LOW (ref 1.7–2.4)

## 2018-11-08 SURGERY — CREATION, BYPASS, ARTERIAL, AORTA TO FEMORAL, BILATERAL, USING GRAFT
Anesthesia: General | Laterality: Bilateral

## 2018-11-08 MED ORDER — HEPARIN SODIUM (PORCINE) 1000 UNIT/ML IJ SOLN
INTRAMUSCULAR | Status: DC | PRN
  Administered 2018-11-08: 9000 [IU] via INTRAVENOUS

## 2018-11-08 MED ORDER — NALOXONE HCL 0.4 MG/ML IJ SOLN: 0.4000 mg | INTRAMUSCULAR | Status: DC | PRN

## 2018-11-08 MED ORDER — SODIUM CHLORIDE 0.9% FLUSH: 9.0000 mL | INTRAVENOUS | Status: DC | PRN

## 2018-11-08 MED ORDER — HYDROMORPHONE HCL 1 MG/ML IJ SOLN
0.2500 mg | INTRAMUSCULAR | Status: DC | PRN
  Administered 2018-11-08 (×2): 0.5 mg via INTRAVENOUS

## 2018-11-08 MED ORDER — SUCCINYLCHOLINE CHLORIDE 200 MG/10ML IV SOSY
PREFILLED_SYRINGE | INTRAVENOUS | Status: AC
  Filled 2018-11-08: qty 10

## 2018-11-08 MED ORDER — CEFAZOLIN SODIUM-DEXTROSE 2-3 GM-%(50ML) IV SOLR
INTRAVENOUS | Status: DC | PRN
  Administered 2018-11-08: 2 g via INTRAVENOUS

## 2018-11-08 MED ORDER — SODIUM CHLORIDE 0.9 % IV SOLN
INTRAVENOUS | Status: DC
  Administered 2018-11-08 – 2018-11-10 (×4): via INTRAVENOUS

## 2018-11-08 MED ORDER — PROPOFOL 10 MG/ML IV BOLUS
INTRAVENOUS | Status: DC | PRN
  Administered 2018-11-08: 200 mg via INTRAVENOUS
  Administered 2018-11-08: 30 mg via INTRAVENOUS

## 2018-11-08 MED ORDER — HYDROMORPHONE HCL 1 MG/ML IJ SOLN
1.0000 mg | Freq: Once | INTRAMUSCULAR | Status: AC
  Administered 2018-11-08: 18:00:00 1 mg via INTRAVENOUS

## 2018-11-08 MED ORDER — 0.9 % SODIUM CHLORIDE (POUR BTL) OPTIME
TOPICAL | Status: DC | PRN
  Administered 2018-11-08: 09:00:00 2000 mL

## 2018-11-08 MED ORDER — HYDROMORPHONE HCL 1 MG/ML IJ SOLN
INTRAMUSCULAR | Status: AC
  Filled 2018-11-08: qty 1

## 2018-11-08 MED ORDER — HYDROMORPHONE HCL 1 MG/ML IJ SOLN
INTRAMUSCULAR | Status: AC
  Administered 2018-11-08: 18:00:00 1 mg via INTRAVENOUS
  Filled 2018-11-08: qty 1

## 2018-11-08 MED ORDER — PROMETHAZINE HCL 25 MG/ML IJ SOLN: 6.2500 mg | INTRAMUSCULAR | Status: DC | PRN

## 2018-11-08 MED ORDER — SUGAMMADEX SODIUM 200 MG/2ML IV SOLN
INTRAVENOUS | Status: DC | PRN
  Administered 2018-11-08: 200 mg via INTRAVENOUS

## 2018-11-08 MED ORDER — PROPOFOL 10 MG/ML IV BOLUS
INTRAVENOUS | Status: AC
  Filled 2018-11-08: qty 20

## 2018-11-08 MED ORDER — MIDAZOLAM HCL 2 MG/2ML IJ SOLN
2.0000 mg | Freq: Once | INTRAMUSCULAR | Status: AC
  Administered 2018-11-08: 2 mg via INTRAVENOUS

## 2018-11-08 MED ORDER — SODIUM CHLORIDE 0.9 % IV SOLN
INTRAVENOUS | Status: DC | PRN
  Administered 2018-11-08: 09:00:00 500 mL

## 2018-11-08 MED ORDER — HYDRALAZINE HCL 20 MG/ML IJ SOLN: 5.0000 mg | INTRAMUSCULAR | Status: DC | PRN

## 2018-11-08 MED ORDER — MANNITOL 25 % IV SOLN
INTRAVENOUS | Status: DC | PRN
  Administered 2018-11-08: 25 g via INTRAVENOUS

## 2018-11-08 MED ORDER — GUAIFENESIN-DM 100-10 MG/5ML PO SYRP: 15.0000 mL | ORAL_SOLUTION | ORAL | Status: DC | PRN

## 2018-11-08 MED ORDER — HEPARIN SODIUM (PORCINE) 1000 UNIT/ML IJ SOLN
INTRAMUSCULAR | Status: AC
  Filled 2018-11-08: qty 1

## 2018-11-08 MED ORDER — LIDOCAINE 2% (20 MG/ML) 5 ML SYRINGE
INTRAMUSCULAR | Status: AC
  Filled 2018-11-08: qty 5

## 2018-11-08 MED ORDER — MIDAZOLAM HCL 2 MG/2ML IJ SOLN
INTRAMUSCULAR | Status: AC
  Administered 2018-11-08: 09:00:00 2 mg via INTRAVENOUS
  Filled 2018-11-08: qty 2

## 2018-11-08 MED ORDER — MORPHINE SULFATE (PF) 4 MG/ML IV SOLN
INTRAVENOUS | Status: AC
  Administered 2018-11-08: 17:00:00 4 mg
  Filled 2018-11-08: qty 1

## 2018-11-08 MED ORDER — CHLORHEXIDINE GLUCONATE CLOTH 2 % EX PADS
6.0000 | MEDICATED_PAD | Freq: Every day | CUTANEOUS | Status: DC
  Administered 2018-11-11: 6 via TOPICAL

## 2018-11-08 MED ORDER — PROTAMINE SULFATE 10 MG/ML IV SOLN
INTRAVENOUS | Status: DC | PRN
  Administered 2018-11-08: 10 mg via INTRAVENOUS
  Administered 2018-11-08 (×2): 20 mg via INTRAVENOUS

## 2018-11-08 MED ORDER — LABETALOL HCL 5 MG/ML IV SOLN: 10.0000 mg | INTRAVENOUS | Status: DC | PRN

## 2018-11-08 MED ORDER — FENTANYL CITRATE (PF) 250 MCG/5ML IJ SOLN
INTRAMUSCULAR | Status: DC | PRN
  Administered 2018-11-08 (×2): 50 ug via INTRAVENOUS
  Administered 2018-11-08: 100 ug via INTRAVENOUS
  Administered 2018-11-08 (×9): 50 ug via INTRAVENOUS

## 2018-11-08 MED ORDER — SODIUM CHLORIDE 0.9 % IV SOLN: 500.0000 mL | Freq: Once | INTRAVENOUS | Status: DC | PRN

## 2018-11-08 MED ORDER — CEFAZOLIN SODIUM-DEXTROSE 2-4 GM/100ML-% IV SOLN
2.0000 g | Freq: Three times a day (TID) | INTRAVENOUS | Status: AC
  Administered 2018-11-08 – 2018-11-09 (×2): 2 g via INTRAVENOUS
  Filled 2018-11-08 (×3): qty 100

## 2018-11-08 MED ORDER — CHLORHEXIDINE GLUCONATE CLOTH 2 % EX PADS
6.0000 | MEDICATED_PAD | Freq: Every day | CUTANEOUS | Status: DC
  Administered 2018-11-09 – 2018-11-10 (×2): 6 via TOPICAL

## 2018-11-08 MED ORDER — MUPIROCIN 2 % EX OINT
1.0000 "application " | TOPICAL_OINTMENT | Freq: Two times a day (BID) | CUTANEOUS | Status: DC
  Administered 2018-11-08 – 2018-11-11 (×7): 1 via NASAL
  Filled 2018-11-08: qty 22

## 2018-11-08 MED ORDER — DEXAMETHASONE SODIUM PHOSPHATE 10 MG/ML IJ SOLN
INTRAMUSCULAR | Status: AC
  Filled 2018-11-08: qty 1

## 2018-11-08 MED ORDER — SODIUM CHLORIDE 0.9 % IV SOLN
INTRAVENOUS | Status: DC | PRN
  Administered 2018-11-08: 10:00:00 20 ug/min via INTRAVENOUS

## 2018-11-08 MED ORDER — ROCURONIUM BROMIDE 10 MG/ML (PF) SYRINGE
PREFILLED_SYRINGE | INTRAVENOUS | Status: AC
  Filled 2018-11-08: qty 10

## 2018-11-08 MED ORDER — LACTATED RINGERS IV SOLN
INTRAVENOUS | Status: DC | PRN
  Administered 2018-11-08: 10:00:00 via INTRAVENOUS

## 2018-11-08 MED ORDER — MAGNESIUM SULFATE 2 GM/50ML IV SOLN: 2.0000 g | Freq: Every day | INTRAVENOUS | Status: DC | PRN

## 2018-11-08 MED ORDER — METOPROLOL TARTRATE 5 MG/5ML IV SOLN: 2.0000 mg | INTRAVENOUS | Status: DC | PRN

## 2018-11-08 MED ORDER — SODIUM CHLORIDE 0.9 % IV SOLN
INTRAVENOUS | Status: AC
  Filled 2018-11-08: qty 1.2

## 2018-11-08 MED ORDER — DIPHENHYDRAMINE HCL 12.5 MG/5ML PO ELIX
12.5000 mg | ORAL_SOLUTION | Freq: Four times a day (QID) | ORAL | Status: DC | PRN
  Filled 2018-11-08: qty 5

## 2018-11-08 MED ORDER — FENTANYL CITRATE (PF) 250 MCG/5ML IJ SOLN
INTRAMUSCULAR | Status: AC
  Filled 2018-11-08: qty 5

## 2018-11-08 MED ORDER — LIDOCAINE 2% (20 MG/ML) 5 ML SYRINGE
INTRAMUSCULAR | Status: DC | PRN
  Administered 2018-11-08: 60 mg via INTRAVENOUS

## 2018-11-08 MED ORDER — DIPHENHYDRAMINE HCL 50 MG/ML IJ SOLN
12.5000 mg | Freq: Four times a day (QID) | INTRAMUSCULAR | Status: DC | PRN
  Administered 2018-11-09 – 2018-11-10 (×2): 12.5 mg via INTRAVENOUS
  Filled 2018-11-08 (×2): qty 1

## 2018-11-08 MED ORDER — LACTATED RINGERS IV SOLN
INTRAVENOUS | Status: DC | PRN
  Administered 2018-11-08 (×2): via INTRAVENOUS

## 2018-11-08 MED ORDER — ONDANSETRON HCL 4 MG/2ML IJ SOLN
INTRAMUSCULAR | Status: AC
  Filled 2018-11-08: qty 2

## 2018-11-08 MED ORDER — SUCCINYLCHOLINE CHLORIDE 200 MG/10ML IV SOSY
PREFILLED_SYRINGE | INTRAVENOUS | Status: DC | PRN
  Administered 2018-11-08: 120 mg via INTRAVENOUS

## 2018-11-08 MED ORDER — ONDANSETRON HCL 4 MG/2ML IJ SOLN
INTRAMUSCULAR | Status: DC | PRN
  Administered 2018-11-08: 4 mg via INTRAVENOUS

## 2018-11-08 MED ORDER — SODIUM CHLORIDE 0.9% IV SOLUTION: Freq: Once | INTRAVENOUS | Status: DC

## 2018-11-08 MED ORDER — PHENOL 1.4 % MT LIQD: 1.0000 | OROMUCOSAL | Status: DC | PRN

## 2018-11-08 MED ORDER — FENTANYL CITRATE (PF) 100 MCG/2ML IJ SOLN
INTRAMUSCULAR | Status: AC
  Filled 2018-11-08: qty 2

## 2018-11-08 MED ORDER — HYDROMORPHONE HCL 1 MG/ML IJ SOLN
0.2500 mg | INTRAMUSCULAR | Status: DC | PRN
  Administered 2018-11-08 (×4): 0.5 mg via INTRAVENOUS

## 2018-11-08 MED ORDER — HYDROMORPHONE 1 MG/ML IV SOLN
INTRAVENOUS | Status: DC
  Administered 2018-11-08: 1 mL via INTRAVENOUS
  Administered 2018-11-09: 0.3 mg via INTRAVENOUS
  Administered 2018-11-09: 3 mg via INTRAVENOUS
  Administered 2018-11-09: 1.8 mg via INTRAVENOUS
  Administered 2018-11-09: 2.1 mg via INTRAVENOUS
  Administered 2018-11-09: 1.8 mg via INTRAVENOUS
  Administered 2018-11-09 (×2): 2.4 mg via INTRAVENOUS
  Administered 2018-11-10: 30 mg via INTRAVENOUS
  Administered 2018-11-10: 2.4 mg via INTRAVENOUS
  Administered 2018-11-10: 3.9 mg via INTRAVENOUS
  Administered 2018-11-11: 0.3 mg via INTRAVENOUS
  Filled 2018-11-08 (×2): qty 30

## 2018-11-08 MED ORDER — MIDAZOLAM HCL 2 MG/2ML IJ SOLN
INTRAMUSCULAR | Status: AC
  Filled 2018-11-08: qty 2

## 2018-11-08 MED ORDER — DEXAMETHASONE SODIUM PHOSPHATE 10 MG/ML IJ SOLN
INTRAMUSCULAR | Status: DC | PRN
  Administered 2018-11-08: 10 mg via INTRAVENOUS

## 2018-11-08 MED ORDER — CEFAZOLIN SODIUM 1 G IJ SOLR
INTRAMUSCULAR | Status: AC
  Filled 2018-11-08: qty 20

## 2018-11-08 MED ORDER — MIDAZOLAM HCL 5 MG/5ML IJ SOLN
INTRAMUSCULAR | Status: DC | PRN
  Administered 2018-11-08: 2 mg via INTRAVENOUS

## 2018-11-08 MED ORDER — OXYCODONE HCL 5 MG PO TABS: 5.0000 mg | ORAL_TABLET | Freq: Once | ORAL | Status: DC | PRN

## 2018-11-08 MED ORDER — LACTATED RINGERS IV SOLN
INTRAVENOUS | Status: DC | PRN
  Administered 2018-11-08 (×2): via INTRAVENOUS

## 2018-11-08 MED ORDER — ARTIFICIAL TEARS OPHTHALMIC OINT
TOPICAL_OINTMENT | OPHTHALMIC | Status: DC | PRN
  Administered 2018-11-08: 1 via OPHTHALMIC

## 2018-11-08 MED ORDER — ONDANSETRON HCL 4 MG/2ML IJ SOLN: 4.0000 mg | Freq: Four times a day (QID) | INTRAMUSCULAR | Status: DC | PRN

## 2018-11-08 MED ORDER — ALBUMIN HUMAN 5 % IV SOLN
INTRAVENOUS | Status: DC | PRN
  Administered 2018-11-08 (×2): via INTRAVENOUS

## 2018-11-08 MED ORDER — OXYCODONE HCL 5 MG/5ML PO SOLN: 5.0000 mg | Freq: Once | ORAL | Status: DC | PRN

## 2018-11-08 MED ORDER — PROTAMINE SULFATE 10 MG/ML IV SOLN
INTRAVENOUS | Status: AC
  Filled 2018-11-08: qty 5

## 2018-11-08 MED ORDER — ACETAMINOPHEN 10 MG/ML IV SOLN
INTRAVENOUS | Status: AC
  Filled 2018-11-08: qty 100

## 2018-11-08 MED ORDER — ARTIFICIAL TEARS OPHTHALMIC OINT
TOPICAL_OINTMENT | OPHTHALMIC | Status: AC
  Filled 2018-11-08: qty 3.5

## 2018-11-08 MED ORDER — MORPHINE SULFATE (PF) 2 MG/ML IV SOLN
2.0000 mg | INTRAVENOUS | Status: DC | PRN
  Administered 2018-11-08: 4 mg via INTRAVENOUS

## 2018-11-08 MED ORDER — ROCURONIUM BROMIDE 10 MG/ML (PF) SYRINGE
PREFILLED_SYRINGE | INTRAVENOUS | Status: DC | PRN
  Administered 2018-11-08 (×2): 50 mg via INTRAVENOUS
  Administered 2018-11-08: 100 mg via INTRAVENOUS
  Administered 2018-11-08: 10 mg via INTRAVENOUS

## 2018-11-08 MED ORDER — ACETAMINOPHEN 10 MG/ML IV SOLN
1000.0000 mg | Freq: Four times a day (QID) | INTRAVENOUS | Status: DC
  Administered 2018-11-08: 16:00:00 1000 mg via INTRAVENOUS

## 2018-11-08 MED ORDER — LACTATED RINGERS IV SOLN
INTRAVENOUS | Status: DC
  Administered 2018-11-08: 09:00:00 via INTRAVENOUS

## 2018-11-08 SURGICAL SUPPLY — 58 items
CANISTER SUCT 3000ML PPV (MISCELLANEOUS) ×3 IMPLANT
CANNULA VESSEL 3MM 2 BLNT TIP (CANNULA) ×6 IMPLANT
CLIP LIGATING EXTRA MED SLVR (CLIP) ×3 IMPLANT
CLIP LIGATING EXTRA SM BLUE (MISCELLANEOUS) ×3 IMPLANT
COVER BACK TABLE 60X90IN (DRAPES) ×3 IMPLANT
COVER WAND RF STERILE (DRAPES) ×3 IMPLANT
DERMABOND ADVANCED (GAUZE/BANDAGES/DRESSINGS) ×10
DERMABOND ADVANCED .7 DNX12 (GAUZE/BANDAGES/DRESSINGS) ×2 IMPLANT
DRAPE BILATERAL SPLIT (DRAPES) IMPLANT
DRAPE CV SPLIT W-CLR ANES SCRN (DRAPES) IMPLANT
ELECT BLADE 4.0 EZ CLEAN MEGAD (MISCELLANEOUS)
ELECT REM PT RETURN 9FT ADLT (ELECTROSURGICAL) ×3
ELECTRODE BLDE 4.0 EZ CLN MEGD (MISCELLANEOUS) IMPLANT
ELECTRODE REM PT RTRN 9FT ADLT (ELECTROSURGICAL) ×1 IMPLANT
FELT TEFLON 1X6 (MISCELLANEOUS) ×2 IMPLANT
GLOVE BIO SURGEON STRL SZ 6.5 (GLOVE) ×1 IMPLANT
GLOVE BIO SURGEON STRL SZ7.5 (GLOVE) ×2 IMPLANT
GLOVE BIO SURGEONS STRL SZ 6.5 (GLOVE) ×1
GLOVE BIOGEL M 6.5 STRL (GLOVE) ×2 IMPLANT
GLOVE BIOGEL M STRL SZ7.5 (GLOVE) ×4 IMPLANT
GLOVE BIOGEL PI IND STRL 7.5 (GLOVE) IMPLANT
GLOVE BIOGEL PI IND STRL 8 (GLOVE) IMPLANT
GLOVE BIOGEL PI INDICATOR 7.5 (GLOVE) ×4
GLOVE BIOGEL PI INDICATOR 8 (GLOVE) ×8
GLOVE ECLIPSE 7.5 STRL STRAW (GLOVE) ×4 IMPLANT
GLOVE SS BIOGEL STRL SZ 7.5 (GLOVE) ×1 IMPLANT
GLOVE SUPERSENSE BIOGEL SZ 7.5 (GLOVE) ×2
GOWN STRL REUS W/ TWL LRG LVL3 (GOWN DISPOSABLE) ×3 IMPLANT
GOWN STRL REUS W/ TWL XL LVL3 (GOWN DISPOSABLE) IMPLANT
GOWN STRL REUS W/TWL LRG LVL3 (GOWN DISPOSABLE) ×6
GOWN STRL REUS W/TWL XL LVL3 (GOWN DISPOSABLE) ×14 IMPLANT
GRAFT HEMASHIELD 14X8MM (Vascular Products) ×2 IMPLANT
INSERT FOGARTY 61MM (MISCELLANEOUS) ×3 IMPLANT
INSERT FOGARTY SM (MISCELLANEOUS) ×8 IMPLANT
KIT BASIN OR (CUSTOM PROCEDURE TRAY) ×3 IMPLANT
KIT TURNOVER KIT B (KITS) ×3 IMPLANT
NS IRRIG 1000ML POUR BTL (IV SOLUTION) ×6 IMPLANT
PACK AORTA (CUSTOM PROCEDURE TRAY) ×3 IMPLANT
PAD ARMBOARD 7.5X6 YLW CONV (MISCELLANEOUS) ×6 IMPLANT
SPONGE LAP 18X18 RF (DISPOSABLE) IMPLANT
SUT ETHIBOND 5 LR DA (SUTURE) IMPLANT
SUT PDS AB 1 TP1 54 (SUTURE) ×6 IMPLANT
SUT PROLENE 3 0 SH1 36 (SUTURE) ×5 IMPLANT
SUT PROLENE 5 0 C 1 24 (SUTURE) ×6 IMPLANT
SUT PROLENE 5 0 C 1 36 (SUTURE) IMPLANT
SUT SILK 2 0 (SUTURE) ×2
SUT SILK 2 0 SH CR/8 (SUTURE) ×3 IMPLANT
SUT SILK 2-0 18XBRD TIE 12 (SUTURE) ×1 IMPLANT
SUT SILK 3 0 (SUTURE) ×2
SUT SILK 3-0 18XBRD TIE 12 (SUTURE) ×1 IMPLANT
SUT VIC AB 2-0 CT1 36 (SUTURE) ×8 IMPLANT
SUT VIC AB 3-0 SH 27 (SUTURE) ×8
SUT VIC AB 3-0 SH 27X BRD (SUTURE) ×4 IMPLANT
SUT VIC AB 4-0 PS2 18 (SUTURE) ×2 IMPLANT
TOWEL GREEN STERILE (TOWEL DISPOSABLE) ×3 IMPLANT
TOWEL SURG RFD BLUE STRL DISP (DISPOSABLE) ×8 IMPLANT
TRAY FOLEY MTR SLVR 16FR STAT (SET/KITS/TRAYS/PACK) ×3 IMPLANT
WATER STERILE IRR 1000ML POUR (IV SOLUTION) ×6 IMPLANT

## 2018-11-08 NOTE — Interval H&P Note (Signed)
History and Physical Interval Note:  11/08/2018 7:27 AM  Brandon Parks  has presented today for surgery, with the diagnosis of PERIPHERAL VASCULAR DISEASE WITH CLAUDICATION.  The various methods of treatment have been discussed with the patient and family. After consideration of risks, benefits and other options for treatment, the patient has consented to  Procedure(s): AORTA BIFEMORAL BYPASS GRAFT (Bilateral) as a surgical intervention.  The patient's history has been reviewed, patient examined, no change in status, stable for surgery.  I have reviewed the patient's chart and labs.  Questions were answered to the patient's satisfaction.     Curt Jews

## 2018-11-08 NOTE — Anesthesia Procedure Notes (Signed)
Procedure Name: Intubation Date/Time: 11/08/2018 10:01 AM Performed by: Harden Mo, CRNA Pre-anesthesia Checklist: Patient identified, Emergency Drugs available, Suction available and Patient being monitored Patient Re-evaluated:Patient Re-evaluated prior to induction Oxygen Delivery Method: Circle System Utilized Preoxygenation: Pre-oxygenation with 100% oxygen Induction Type: IV induction, Rapid sequence and Cricoid Pressure applied Ventilation: Mask ventilation without difficulty Laryngoscope Size: Miller and 2 Grade View: Grade I Tube type: Oral Tube size: 7.5 mm Number of attempts: 1 Airway Equipment and Method: Stylet and Oral airway Placement Confirmation: ETT inserted through vocal cords under direct vision,  positive ETCO2 and breath sounds checked- equal and bilateral Secured at: 23 cm Tube secured with: Tape Dental Injury: Teeth and Oropharynx as per pre-operative assessment

## 2018-11-08 NOTE — Op Note (Signed)
OPERATIVE REPORT  DATE OF SURGERY: 11/08/2018  PATIENT: Brandon AdamRobert Nicholas Parks, 38 y.o. male MRN: [id number]  DOB: 04/24/1980  PRE-OPERATIVE DIAGNOSIS: Aortoiliac occlusive disease  POST-OPERATIVE DIAGNOSIS:  Same  PROCEDURE: Aortobifemoral bypass with 14 x 8 Hemashield Dacron graft  SURGEON:  Gretta Beganodd Markelle Asaro, M.D.  PHYSICIAN ASSISTANT: Dr. Cari Carawayhris Dickson, Aggie MoatsMatt Eveland, PA-C  ANESTHESIA: General  EBL: per anesthesia record  Total I/O In: 3900 [I.V.:3400; IV Piggyback:500] Out: 975 [Urine:775; Blood:200]  BLOOD ADMINISTERED: none  DRAINS: none  SPECIMEN: none  COUNTS CORRECT:  YES  PATIENT DISPOSITION:  PACU - hemodynamically stable  PROCEDURE DETAILS: Patient was taken the operating placed supine position where the area of the abdomen both groins are prepped draped usual sterile fashion.  Vision was made over each groin and carried down through the subcutaneous fat level of the femoral arteries bilaterally.  Common femoral arteries had minimal atherosclerotic change.  There was scarring from prior arteriograms.  The common femoral arteries identified bilaterally at the level of the inguinal ligament.  The artery was mobilized and encircled with a vessel loop.  Crossing vein at the inguinal ligament was ligated and divided bilaterally. Incision was made from the level of the xiphoid to just below the umbilicus and carried down through the midline fat to the level of the linea alba.  The linea alba was opened with electrocautery.  The peritoneum was entered.  The Omni-Tract retractor was used for exposure.  The transverse colon and omentum were reflected superiorly and the small bowel was reflected to the right.  The duodenum was mobilized off the aorta.  The aorta had minimal atherosclerotic change.  The aorta was encircled below the level of the renal arteries.  Dissection was continued down to the level of the aortic bifurcation.  Tunnels were created from the aortic bifurcation to  the groins bilaterally taking care to pass behind the level of the the ureters.  Umbilical tapes were passed through these tunnels for later tunneling of the graft.  The patient was given 25 g of mannitol and 9000 units of intravenous heparin.  After adequate circulation time the aorta was occluded with a Harken clamp below the level of the renal arteries and distal with an aortic clamp.  The aorta was transected and an ellipse of aorta was removed below the proximal clamp.  The distal aorta was oversewn with 2 layers of 3-0 Prolene suture.  This anastomosis was tested and found to be adequate.  Next the 14 x 7 Hemashield graft was brought onto the field and was cut to the appropriate length.  Using a felt strip for reinforcement the graft was sewn into into the aorta with a running 3-0 Prolene suture.  This anastomosis was tested and found to be adequate.  The right and left limbs of the graft were then brought to the respective groins through the prior created tunnel.  The common femoral arteries were occluded bilaterally proximally and distally and were opened with 11 blade and sent longitudinally with Potts scissors.  The right and left groins were sent to their respective common femoral artery with a running 5-0 Prolene sutures.  Prior to completion of each anastomosis the usual flushing maneuvers were undertaken.  After completion of the anastomosis flows restored first to the right leg and then to the left leg.  Excellent flow characteristics were noted with hand-held Doppler in the femoral artery.  The patient was given 50 mg of protamine to reverse the heparin.  The wounds were  irrigated with saline.  Hemostasis talus cautery.  Wounds were closed with 2-0 Vicryl to close the retroperitoneum over the aortic graft in a running fashion.  The Omni-Tract retractor was removed.  The small bowel was run in its entirety and found to be without injury was placed in the pelvis.  Transverse colon omentum were placed  over this.  The midline fascia was closed with a #1 PDS suture beginning proximally distally and tying in the middle.  Skin was closed with 3-0 subcuticular Vicryl stitch.  The groins were then closed with 2-0 Vicryl in the several layers in the subcutaneous fat.  Skin was closed with 3-0 subcuticular Vicryl stitch.  Patient was transferred to the recovery room in stable condition   Brandon Parks, M.D., [facility] 11/08/2018 2:49 PM

## 2018-11-08 NOTE — Plan of Care (Signed)
Pain main issue post op started PCA to assist with control of pain

## 2018-11-08 NOTE — Anesthesia Postprocedure Evaluation (Signed)
Anesthesia Post Note  Patient: Brandon Parks  Procedure(s) Performed: AORTA BIFEMORAL BYPASS GRAFT USING HEMASHIELD GOLD 14X8MM VASCULAR GRAFT (Bilateral )     Patient location during evaluation: PACU Anesthesia Type: General Level of consciousness: awake and alert Pain management: pain level controlled Vital Signs Assessment: post-procedure vital signs reviewed and stable Respiratory status: spontaneous breathing, nonlabored ventilation, respiratory function stable and patient connected to nasal cannula oxygen Cardiovascular status: blood pressure returned to baseline and stable Postop Assessment: no apparent nausea or vomiting Anesthetic complications: no    Last Vitals:  Vitals:   11/08/18 1607 11/08/18 1615  BP: (!) 142/83   Pulse: 62 65  Resp: (!) 21 16  Temp:  36.9 C  SpO2: 100% 100%    Last Pain:  Vitals:   11/08/18 1635  TempSrc: Oral  PainSc: 6                  Ryan P Ellender

## 2018-11-08 NOTE — Transfer of Care (Signed)
Immediate Anesthesia Transfer of Care Note  Patient: Brandon Parks  Procedure(s) Performed: AORTA BIFEMORAL BYPASS GRAFT USING HEMASHIELD GOLD 14X8MM VASCULAR GRAFT (Bilateral )  Patient Location: PACU  Anesthesia Type:General  Level of Consciousness: awake, alert  and oriented  Airway & Oxygen Therapy: Patient Spontanous Breathing and Patient connected to nasal cannula oxygen  Post-op Assessment: Report given to RN, Post -op Vital signs reviewed and stable and Patient moving all extremities X 4  Post vital signs: Reviewed and stable  Last Vitals:  Vitals Value Taken Time  BP 129/68 11/08/18 1422  Temp    Pulse 67 11/08/18 1425  Resp 12 11/08/18 1425  SpO2 97 % 11/08/18 1425  Vitals shown include unvalidated device data.  Last Pain:  Vitals:   11/08/18 0436  TempSrc: Oral  PainSc: 0-No pain      Patients Stated Pain Goal: 0 (83/38/25 0539)  Complications: No apparent anesthesia complications

## 2018-11-08 NOTE — Anesthesia Procedure Notes (Signed)
Arterial Line Insertion Start/End7/20/2020 9:00 AM, 11/08/2018 9:30 AM Performed by: Leonor Liv, CRNA, CRNA  Patient location: Pre-op. Preanesthetic checklist: patient identified, IV checked, site marked, risks and benefits discussed, surgical consent, monitors and equipment checked, pre-op evaluation, timeout performed and anesthesia consent Lidocaine 1% used for infiltration and patient sedated Left, radial was placed Catheter size: 20 G Hand hygiene performed  and maximum sterile barriers used  Allen's test indicative of satisfactory collateral circulation Attempts: 2 Procedure performed without using ultrasound guided technique. Ultrasound Notes:anatomy identified, needle tip was noted to be adjacent to the nerve/plexus identified and no ultrasound evidence of intravascular and/or intraneural injection Following insertion, dressing applied and Biopatch. Post procedure assessment: normal  Patient tolerated the procedure well with no immediate complications.

## 2018-11-08 NOTE — Progress Notes (Signed)
Admitted to 2 H post operative. C/o pain mostly in abdomen. Medicated with morphine. Pulses dopplerable in feet PT and DP.

## 2018-11-08 NOTE — OR Nursing (Signed)
Contacted patient's wife to update on start of surgery.  Keri Heydt [phone]

## 2018-11-08 NOTE — Anesthesia Procedure Notes (Signed)
Central Venous Catheter Insertion Performed by: Murvin Natal, MD, anesthesiologist Start/End7/20/2020 10:10 AM, 11/08/2018 10:20 AM Patient location: OR. Preanesthetic checklist: patient identified, IV checked, site marked, risks and benefits discussed, surgical consent, monitors and equipment checked, pre-op evaluation, timeout performed and anesthesia consent Position: Trendelenburg Lidocaine 1% used for infiltration and patient sedated Hand hygiene performed , maximum sterile barriers used  and Seldinger technique used Catheter size: 8 Fr Total catheter length 16. Central line was placed.Double lumen Procedure performed using ultrasound guided technique. Ultrasound Notes:anatomy identified, needle tip was noted to be adjacent to the nerve/plexus identified, no ultrasound evidence of intravascular and/or intraneural injection and image(s) printed for medical record Attempts: 1 Following insertion, dressing applied, line sutured and Biopatch. Post procedure assessment: blood return through all ports, free fluid flow and no air  Patient tolerated the procedure well with no immediate complications.

## 2018-11-08 NOTE — Progress Notes (Signed)
Patient feet cool pulses dopplerable. , refusing to turn until pain under better control.  Has wet washcloths, sweating due to pain, not hot, not cold.sheet changed. PCA started ET CO2 40 explained to patient about  PCA and use.

## 2018-11-08 NOTE — Progress Notes (Signed)
     Patient A&O Moving all 4 ext. Doppler DP/PT B Abdominal incision healing well, groins soft and healing well  S/P Aortobifemoral bypass with 14 x 8 Hemashield Dacron graft  Stable post op disposition with patent arterial flow to B LE.  Roxy Horseman PA-C

## 2018-11-08 NOTE — Progress Notes (Signed)
Report called to nurse at short stay.

## 2018-11-09 ENCOUNTER — Inpatient Hospital Stay (HOSPITAL_COMMUNITY): Payer: 59

## 2018-11-09 ENCOUNTER — Encounter (HOSPITAL_COMMUNITY): Payer: Self-pay | Admitting: Vascular Surgery

## 2018-11-09 LAB — CBC
HCT: 44.2 % (ref 39.0–52.0)
Hemoglobin: 14.7 g/dL (ref 13.0–17.0)
MCH: 31.1 pg (ref 26.0–34.0)
MCHC: 33.3 g/dL (ref 30.0–36.0)
MCV: 93.4 fL (ref 80.0–100.0)
Platelets: 146 10*3/uL — ABNORMAL LOW (ref 150–400)
RBC: 4.73 MIL/uL (ref 4.22–5.81)
RDW: 11.4 % — ABNORMAL LOW (ref 11.5–15.5)
WBC: 8 10*3/uL (ref 4.0–10.5)
nRBC: 0 % (ref 0.0–0.2)

## 2018-11-09 LAB — COMPREHENSIVE METABOLIC PANEL
ALT: 57 U/L — ABNORMAL HIGH (ref 0–44)
AST: 46 U/L — ABNORMAL HIGH (ref 15–41)
Albumin: 3.8 g/dL (ref 3.5–5.0)
Alkaline Phosphatase: 60 U/L (ref 38–126)
Anion gap: 10 (ref 5–15)
BUN: 10 mg/dL (ref 6–20)
CO2: 26 mmol/L (ref 22–32)
Calcium: 9.1 mg/dL (ref 8.9–10.3)
Chloride: 104 mmol/L (ref 98–111)
Creatinine, Ser: 1 mg/dL (ref 0.61–1.24)
GFR calc Af Amer: >60 mL/min (ref >60)
GFR calc non Af Amer: >60 mL/min (ref >60)
Glucose, Bld: 89 mg/dL (ref 70–99)
Potassium: 4.1 mmol/L (ref 3.5–5.1)
Sodium: 140 mmol/L (ref 135–145)
Total Bilirubin: 0.6 mg/dL (ref 0.3–1.2)
Total Protein: 6.8 g/dL (ref 6.5–8.1)

## 2018-11-09 LAB — MAGNESIUM: Magnesium: 1.8 mg/dL (ref 1.7–2.4)

## 2018-11-09 LAB — AMYLASE: Amylase: 32 U/L (ref 28–100)

## 2018-11-09 LAB — LIPOPROTEIN A (LPA): Lipoprotein (a): 237.3 nmol/L — ABNORMAL HIGH (ref <75.0)

## 2018-11-09 MED ORDER — CHLORHEXIDINE GLUCONATE 0.12 % MT SOLN
15.0000 mL | Freq: Two times a day (BID) | OROMUCOSAL | Status: DC
  Administered 2018-11-09 – 2018-11-11 (×5): 15 mL via OROMUCOSAL
  Filled 2018-11-09 (×5): qty 15

## 2018-11-09 MED ORDER — ORAL CARE MOUTH RINSE
15.0000 mL | Freq: Two times a day (BID) | OROMUCOSAL | Status: DC
  Administered 2018-11-09 – 2018-11-10 (×3): 15 mL via OROMUCOSAL

## 2018-11-09 NOTE — Evaluation (Signed)
Physical Therapy Evaluation Patient Details Name: Brandon Parks MRN: [id number] DOB: [birth year] Today's Date: 11/09/2018   History of Present Illness  Pt is a 38 y/o male presenting with L leg pain and found with acute occulsion of L iliac artery stent. S/P aortobifemoral bypass 11/08/18. PMH: CABG, B iliac stenting, CAD, MI, NSTEMI, PAD.   Clinical Impression  Pt admitted with/for L leg pain and BPG'ing described above.  Pt is mobilizing at a min guard level on evaluation.  Pt currently limited functionally due to the problems listed below.  (see problems list.)  Pt will benefit from PT to maximize function and safety to be able to get home safely with available assist.     Follow Up Recommendations No PT follow up    Equipment Recommendations  Other (comment)(TBA)    Recommendations for Other Services       Precautions / Restrictions Precautions Precautions: None Restrictions Weight Bearing Restrictions: No      Mobility  Bed Mobility               General bed mobility comments: OOB in recliner upon entry   Transfers Overall transfer level: Needs assistance Equipment used: Rolling walker (2 wheeled) Transfers: Sit to/from Stand Sit to Stand: Min guard         General transfer comment: min guard for safety with cueing for hand placement, increased time and effort due to pain   Ambulation/Gait Ambulation/Gait assistance: Min guard Gait Distance (Feet): 110 Feet Assistive device: Rolling walker (2 wheeled) Gait Pattern/deviations: Step-through pattern Gait velocity: slower Gait velocity interpretation: 1.31 - 2.62 ft/sec, indicative of limited community ambulator General Gait Details: steady, but guarded, stiff and slower with RW assisting with pain.  Stairs            Wheelchair Mobility    Modified Rankin (Stroke Patients Only)       Balance Overall balance assessment: Needs assistance Sitting-balance support: No upper extremity  supported;Feet supported Sitting balance-Leahy Scale: Fair Sitting balance - Comments: limited dynamic balance due to pain    Standing balance support: Bilateral upper extremity supported;During functional activity;No upper extremity supported Standing balance-Leahy Scale: Fair Standing balance comment: able to stand statically without UE support, but relaint on B UE support dynamically                             Pertinent Vitals/Pain Pain Assessment: 0-10 Pain Score: 8  Pain Location: abdominal/groin  Pain Descriptors / Indicators: Burning Pain Intervention(s): PCA encouraged;Monitored during session    Home Living Family/patient expects to be discharged to:: Private residence Living Arrangements: Children;Spouse/significant other Available Help at Discharge: Family;Available 24 hours/day Type of Home: House Home Access: Stairs to enter   [employer] of Steps: 1 Home Layout: One level Home Equipment: None      Prior Function Level of Independence: Independent         Comments: apartment maintance      Hand Dominance        Extremity/Trunk Assessment   Upper Extremity Assessment Upper Extremity Assessment: Overall WFL for tasks assessed    Lower Extremity Assessment Lower Extremity Assessment: Overall WFL for tasks assessed(stiff, sore and guarded.)       Communication   Communication: No difficulties  Cognition Arousal/Alertness: Awake/alert Behavior During Therapy: WFL for tasks assessed/performed Overall Cognitive Status: Within Functional Limits for tasks assessed  General Comments General comments (skin integrity, edema, etc.): VSS, PCA pump x 2 during session for pain control    Exercises     Assessment/Plan    PT Assessment Patient needs continued PT services  PT Problem List Decreased activity tolerance;Decreased mobility;Decreased coordination;Pain       PT  Treatment Interventions DME instruction;Gait training;Stair training;Functional mobility training;Therapeutic activities;Patient/family education    PT Goals (Current goals can be found in the Care Plan section)  Acute Rehab PT Goals Patient Stated Goal: to have less pain  Potential to Achieve Goals: Good    Frequency Min 3X/week   Barriers to discharge        Co-evaluation PT/OT/SLP Co-Evaluation/Treatment: Yes Reason for Co-Treatment: To address functional/ADL transfers PT goals addressed during session: Mobility/safety with mobility OT goals addressed during session: ADL's and self-care;Other (comment)(pain tolerance)       AM-PAC PT "6 Clicks" Mobility  Outcome Measure Help needed turning from your back to your side while in a flat bed without using bedrails?: A Little Help needed moving from lying on your back to sitting on the side of a flat bed without using bedrails?: A Little Help needed moving to and from a bed to a chair (including a wheelchair)?: A Little Help needed standing up from a chair using your arms (e.g., wheelchair or bedside chair)?: A Little Help needed to walk in hospital room?: A Little Help needed climbing 3-5 steps with a railing? : A Little 6 Click Score: 18    End of Session   Activity Tolerance: Patient tolerated treatment well Patient left: in chair   PT Visit Diagnosis: Other abnormalities of gait and mobility (R26.89);Pain Pain - part of body: (abdomen and groin)    Time: [id number] PT Time Calculation (min) (ACUTE ONLY): 19 min   Charges:   PT Evaluation $PT Eval Low Complexity: 1 Low          11/09/2018  Brandon Parks, PT Acute Rehabilitation Services [phone]  (pager) [phone]  (office)  Brandon Parks 11/09/2018, 2:21 PM

## 2018-11-09 NOTE — Evaluation (Signed)
Occupational Therapy Evaluation Patient Details Name: Brandon AdamRobert Nicholas Parks MRN: [id number] DOB: 12/28/1980 Today's Date: 11/09/2018    History of Present Illness Pt is a 38 y/o male presenting with L leg pain and found with acute occulsion of L iliac artery stent. S/P aortobifemoral bypass 11/08/18. PMH: CABG, B iliac stenting, CAD, MI, NSTEMI, PAD.    Clinical Impression   PTA patient independent and working. Admitted for above and limited by problem list below, including abdominal pain, impaired dynamic balance, and decreased activity tolerance. Patient requires min-mod assist for LB ADLs, min guard for transfers and mobility (+2 safety) using RW.  He was educated on safety and benefits of progressive mobility.  Plans to dc home with spouses support (she is working from home) and therefore he will have 24/7 support as needed.  Anticipate he will progress well as his pain gets better controlled. Will follow acutely in order to maximize independence with ADLs,mobility but anticipate he will not need further OT services after dc.     Follow Up Recommendations  No OT follow up;Supervision - Intermittent    Equipment Recommendations  3 in 1 bedside commode    Recommendations for Other Services       Precautions / Restrictions Restrictions Weight Bearing Restrictions: No      Mobility Bed Mobility               General bed mobility comments: OOB in recliner upon entry   Transfers Overall transfer level: Needs assistance Equipment used: Rolling walker (2 wheeled) Transfers: Sit to/from Stand Sit to Stand: Min guard         General transfer comment: min guard for safety with cueing for hand placement, increased time and effort due to pain     Balance Overall balance assessment: Needs assistance Sitting-balance support: No upper extremity supported;Feet supported Sitting balance-Leahy Scale: Fair Sitting balance - Comments: limited dynamic balance due to pain    Standing  balance support: Bilateral upper extremity supported;During functional activity;No upper extremity supported Standing balance-Leahy Scale: Fair Standing balance comment: able to stand statically without UE support, but relaint on B UE support dynamically                           ADL either performed or assessed with clinical judgement   ADL Overall ADL's : Needs assistance/impaired     Grooming: Min guard;Standing   Upper Body Bathing: Set up;Sitting   Lower Body Bathing: Minimal assistance;Sit to/from stand Lower Body Bathing Details (indicate cue type and reason): decreased reach to B feet, min guard sit<>stand  Upper Body Dressing : Set up;Sitting   Lower Body Dressing: Moderate assistance;Sit to/from stand Lower Body Dressing Details (indicate cue type and reason): decreased reach and requires assist with socks, min guard sit<>Stand  Toilet Transfer: Min guard;Ambulation;RW Toilet Transfer Details (indicate cue type and reason): relaint on UE support to transition to stand          Functional mobility during ADLs: Min guard;Supervision/safety;Rolling walker(min guard fading to supervision) General ADL Comments: pt limited by pain in abdomen/groin      Vision         Perception     Praxis      Pertinent Vitals/Pain Pain Assessment: 0-10 Pain Score: 8 (up to 9 with mobility) Pain Location: abdominal/groin  Pain Descriptors / Indicators: Burning Pain Intervention(s): PCA encouraged;Monitored during session;Limited activity within patient's tolerance     Hand Dominance  Extremity/Trunk Assessment Upper Extremity Assessment Upper Extremity Assessment: Overall WFL for tasks assessed   Lower Extremity Assessment Lower Extremity Assessment: Defer to PT evaluation       Communication Communication Communication: No difficulties   Cognition Arousal/Alertness: Awake/alert Behavior During Therapy: WFL for tasks assessed/performed Overall Cognitive  Status: Within Functional Limits for tasks assessed                                     General Comments  VSS, PCA pump x 2 during session for pain control    Exercises     Shoulder Instructions      Home Living Family/patient expects to be discharged to:: Private residence Living Arrangements: Children;Spouse/significant other Available Help at Discharge: Family;Available 24 hours/day Type of Home: House Home Access: Stairs to enter [employer] of Steps: 1   Home Layout: One level     Bathroom Shower/Tub: Teacher, early years/pre: Standard     Home Equipment: None          Prior Functioning/Environment Level of Independence: Independent        Comments: apartment maintance         OT Problem List: Decreased strength;Decreased activity tolerance;Impaired balance (sitting and/or standing);Decreased safety awareness;Pain;Decreased knowledge of use of DME or AE;Decreased knowledge of precautions      OT Treatment/Interventions: Self-care/ADL training;DME and/or AE instruction;Therapeutic activities;Patient/family education;Balance training    OT Goals(Current goals can be found in the care plan section) Acute Rehab OT Goals Patient Stated Goal: to have less pain  OT Goal Formulation: With patient Time For Goal Achievement: 11/23/18 Potential to Achieve Goals: Good  OT Frequency: Min 2X/week   Barriers to D/C:            Co-evaluation PT/OT/SLP Co-Evaluation/Treatment: Yes Reason for Co-Treatment: To address functional/ADL transfers   OT goals addressed during session: ADL's and self-care;Other (comment)(pain tolerance)      AM-PAC OT "6 Clicks" Daily Activity     Outcome Measure Help from another person eating meals?: None Help from another person taking care of personal grooming?: A Little Help from another person toileting, which includes using toliet, bedpan, or urinal?: A Little Help from another person bathing  (including washing, rinsing, drying)?: A Little Help from another person to put on and taking off regular upper body clothing?: None Help from another person to put on and taking off regular lower body clothing?: A Little 6 Click Score: 20   End of Session Equipment Utilized During Treatment: Rolling walker Nurse Communication: Mobility status  Activity Tolerance: Patient tolerated treatment well Patient left: in chair;with call bell/phone within reach  OT Visit Diagnosis: Other abnormalities of gait and mobility (R26.89);Pain Pain - part of body: (incisional- abdomen)                Time: 7616-0737 OT Time Calculation (min): 19 min Charges:  OT General Charges $OT Visit: 1 Visit OT Evaluation $OT Eval Moderate Complexity: [place], OT Acute Rehabilitation Services Pager [phone] Office [phone]   Delight Stare 11/09/2018, 1:36 PM

## 2018-11-09 NOTE — Progress Notes (Signed)
Patient ID: Brandon Parks, male   DOB: 10-31-1980, 38 y.o.   MRN: [id number]  Progress Note    11/09/2018 7:19 AM 1 Day Post-Op  Subjective: Significant abdominal soreness.  Better with PCA pump.  No lower extremity complaints   Vitals:   11/09/18 0404 11/09/18 0500  BP:  120/75  Pulse:  (!) 55  Resp:  17  Temp: 98 F (36.7 C)   SpO2:  98%   Physical Exam: Abdominal and groin incision was without hematoma.  2+ femoral pulses.  2+ left popliteal pulse.  I do not palpate pedal pulses on the left.  1-2+ right posterior tibial pulse  CBC    Component Value Date/Time   WBC 11.6 (H) 11/08/2018 1426   RBC 4.36 11/08/2018 1426   HGB 13.6 11/08/2018 1426   HGB 16.8 06/20/2016 1429   HCT 40.7 11/08/2018 1426   HCT 47.0 06/20/2016 1429   PLT 125 (L) 11/08/2018 1426   PLT 167 06/20/2016 1429   MCV 93.3 11/08/2018 1426   MCV 87 06/20/2016 1429   MCV 92 11/20/2011 1404   MCH 31.2 11/08/2018 1426   MCHC 33.4 11/08/2018 1426   RDW 11.4 (L) 11/08/2018 1426   RDW 12.2 (L) 06/20/2016 1429   RDW 12.5 11/20/2011 1404   LYMPHSABS 1.7 11/04/2018 1335   MONOABS 0.9 11/04/2018 1335   EOSABS 0.2 11/04/2018 1335   BASOSABS 0.0 11/04/2018 1335    BMET    Component Value Date/Time   NA 137 11/08/2018 1426   NA 143 06/20/2016 1429   NA 140 11/20/2011 1404   K 4.1 11/08/2018 1426   K 4.0 11/20/2011 1404   CL 107 11/08/2018 1426   CL 104 11/20/2011 1404   CO2 22 11/08/2018 1426   CO2 29 11/20/2011 1404   GLUCOSE 146 (H) 11/08/2018 1426   GLUCOSE 142 (H) 11/20/2011 1404   BUN 10 11/08/2018 1426   BUN 13 06/20/2016 1429   BUN 14 11/20/2011 1404   CREATININE 1.04 11/08/2018 1426   CREATININE 1.40 (H) 11/20/2011 1404   CALCIUM 8.7 (L) 11/08/2018 1426   CALCIUM 9.2 11/20/2011 1404   GFRNONAA >60 11/08/2018 1426   GFRNONAA >60 11/20/2011 1404   GFRAA >60 11/08/2018 1426   GFRAA >60 11/20/2011 1404    INR    Component Value Date/Time   INR 1.2 11/08/2018 1426   INR 1.0  11/20/2011 1404     Intake/Output Summary (Last 24 hours) at 11/09/2018 0719 Last data filed at 11/09/2018 0600 Gross per 24 hour  Intake 7229.38 ml  Output 975 ml  Net 6254.38 ml     Assessment/Plan:  38 y.o. male doing well.  No nausea or vomiting.  Will transfer to 4 E. progressive care.  DC A-line, Foley and central line.  Ice chips only until tomorrow.  Will advance if no nausea     Rosetta Posner, MD [facility] [phone] 11/09/2018 7:19 AM

## 2018-11-10 DIAGNOSIS — E7841 Elevated Lipoprotein(a): Secondary | ICD-10-CM

## 2018-11-10 LAB — CBC
HCT: 43.3 % (ref 39.0–52.0)
Hemoglobin: 14.1 g/dL (ref 13.0–17.0)
MCH: 30.6 pg (ref 26.0–34.0)
MCHC: 32.6 g/dL (ref 30.0–36.0)
MCV: 93.9 fL (ref 80.0–100.0)
Platelets: 155 10*3/uL (ref 150–400)
RBC: 4.61 MIL/uL (ref 4.22–5.81)
RDW: 11.4 % — ABNORMAL LOW (ref 11.5–15.5)
WBC: 8.6 10*3/uL (ref 4.0–10.5)
nRBC: 0 % (ref 0.0–0.2)

## 2018-11-10 MED ORDER — HYPROMELLOSE (GONIOSCOPIC) 2.5 % OP SOLN
1.0000 [drp] | OPHTHALMIC | Status: DC | PRN
  Filled 2018-11-10: qty 15

## 2018-11-10 MED FILL — Sodium Chloride Irrigation Soln 0.9%: Qty: 3000 | Status: AC

## 2018-11-10 MED FILL — Heparin Sodium (Porcine) Inj 1000 Unit/ML: INTRAMUSCULAR | Qty: 30 | Status: AC

## 2018-11-10 NOTE — Plan of Care (Signed)

## 2018-11-10 NOTE — Progress Notes (Signed)
Physical Therapy Treatment Patient Details Name: Brandon AdamRobert Nicholas Tonkinson MRN: [id number] DOB: 10/06/1980 Today's Date: 11/10/2018    History of Present Illness Pt is a 38 y/o male presenting with L leg pain and found with acute occulsion of L iliac artery stent. S/P aortobifemoral bypass 11/08/18. PMH: CABG, B iliac stenting, CAD, MI, NSTEMI, PAD.     PT Comments    Pt continues with 8/10 pain but is able to perform mobility and gait with supervision with RW this session.   Follow Up Recommendations  No PT follow up     Equipment Recommendations  Rolling walker with 5" wheels    Recommendations for Other Services       Precautions / Restrictions Precautions Precautions: None Restrictions Weight Bearing Restrictions: No    Mobility  Bed Mobility Overal bed mobility: Modified Independent             General bed mobility comments: increased time due to pain  Transfers   Equipment used: Rolling walker (2 wheeled) Transfers: Sit to/from Stand Sit to Stand: Supervision         General transfer comment: cues for hand placement, increased time due to pain  Ambulation/Gait Ambulation/Gait assistance: Supervision Gait Distance (Feet): 110 Feet Assistive device: Rolling walker (2 wheeled)       General Gait Details: pt with slow steady cadence, heavy reliance on UEs due to LE pain   Stairs             Wheelchair Mobility    Modified Rankin (Stroke Patients Only)       Balance                                            Cognition Arousal/Alertness: Awake/alert Behavior During Therapy: WFL for tasks assessed/performed Overall Cognitive Status: Within Functional Limits for tasks assessed                                        Exercises      General Comments        Pertinent Vitals/Pain Pain Score: 8  Pain Location: abdominal/groin  Pain Descriptors / Indicators: Burning Pain Intervention(s): PCA  encouraged;Monitored during session    Home Living                      Prior Function            PT Goals (current goals can now be found in the care plan section) Progress towards PT goals: Progressing toward goals    Frequency    Min 3X/week      PT Plan Current plan remains appropriate    Co-evaluation              AM-PAC PT "6 Clicks" Mobility   Outcome Measure  Help needed turning from your back to your side while in a flat bed without using bedrails?: A Little Help needed moving from lying on your back to sitting on the side of a flat bed without using bedrails?: A Little Help needed moving to and from a bed to a chair (including a wheelchair)?: None Help needed standing up from a chair using your arms (e.g., wheelchair or bedside chair)?: A Little Help needed to walk in hospital room?: A Little Help needed  climbing 3-5 steps with a railing? : A Little 6 Click Score: 19    End of Session   Activity Tolerance: Patient tolerated treatment well Patient left: in bed;with call bell/phone within reach Nurse Communication: Mobility status PT Visit Diagnosis: Other abnormalities of gait and mobility (R26.89);Pain     Time: 9458-5929 PT Time Calculation (min) (ACUTE ONLY): 13 min  Charges:  $Therapeutic Activity: 8-22 mins                     Isabelle Course, PT, DPT   Isabelle Course 11/10/2018, 9:46 AM

## 2018-11-10 NOTE — Progress Notes (Addendum)
   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 The level was 237 (>75 is considered pathologic). 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 Based on this and the fact that he has had recurrent thrombotic events, I would consider long-term anticoagulation with DOAC. Additionally, I would recommend pursuing PCSK9i therapy - which has been associated with 20-30% reduction in LP(a) levels. Specific LP(a) anti-sense therapy is in Phase 3 clinical trials.  I'm happy to see in follow-up in the lipid clinic for management if requested.  Pixie Casino, MD, [facility], [place] Director of the Advanced Lipid Disorders &  Cardiovascular Risk Reduction Clinic Diplomate of the American Board of Clinical Lipidology  Attending Cardiologist  Direct Dial: [phone]  Fax: [phone]  Website:  [email or web address]

## 2018-11-10 NOTE — Progress Notes (Signed)
Wasted 6 cc Dilaudid in stericycle.  Witnessed by [employer] and [employer]

## 2018-11-10 NOTE — Progress Notes (Addendum)
Progress Note    11/10/2018 8:02 AM 2 Days Post-Op  Subjective:  C/o pain  Afebrile HR 60's-80's NSR 130's systolic 94% RA   Vitals:   11/10/18 0356 11/10/18 0400  BP:  135/69  Pulse:  75  Resp: 20 13  Temp:  98.8 F (37.1 C)  SpO2: 93% 93%    Physical Exam: Cardiac:  regular Lungs:  Non labored Incisions:  Laparotomy incision clean and dry as well as bilateral groins Extremities:  Brisk doppler signals DP/PT/peroneal Abdomen:  Soft, mild tenderness to palpation; -flatus; -N/V  CBC    Component Value Date/Time   WBC 8.6 11/10/2018 0314   RBC 4.61 11/10/2018 0314   HGB 14.1 11/10/2018 0314   HGB 16.8 06/20/2016 1429   HCT 43.3 11/10/2018 0314   HCT 47.0 06/20/2016 1429   PLT 155 11/10/2018 0314   PLT 167 06/20/2016 1429   MCV 93.9 11/10/2018 0314   MCV 87 06/20/2016 1429   MCV 92 11/20/2011 1404   MCH 30.6 11/10/2018 0314   MCHC 32.6 11/10/2018 0314   RDW 11.4 (L) 11/10/2018 0314   RDW 12.2 (L) 06/20/2016 1429   RDW 12.5 11/20/2011 1404   LYMPHSABS 1.7 11/04/2018 1335   MONOABS 0.9 11/04/2018 1335   EOSABS 0.2 11/04/2018 1335   BASOSABS 0.0 11/04/2018 1335    BMET    Component Value Date/Time   NA 140 11/09/2018 1750   NA 143 06/20/2016 1429   NA 140 11/20/2011 1404   K 4.1 11/09/2018 1750   K 4.0 11/20/2011 1404   CL 104 11/09/2018 1750   CL 104 11/20/2011 1404   CO2 26 11/09/2018 1750   CO2 29 11/20/2011 1404   GLUCOSE 89 11/09/2018 1750   GLUCOSE 142 (H) 11/20/2011 1404   BUN 10 11/09/2018 1750   BUN 13 06/20/2016 1429   BUN 14 11/20/2011 1404   CREATININE 1.00 11/09/2018 1750   CREATININE 1.40 (H) 11/20/2011 1404   CALCIUM 9.1 11/09/2018 1750   CALCIUM 9.2 11/20/2011 1404   GFRNONAA >60 11/09/2018 1750   GFRNONAA >60 11/20/2011 1404   GFRAA >60 11/09/2018 1750   GFRAA >60 11/20/2011 1404    INR    Component Value Date/Time   INR 1.2 11/08/2018 1426   INR 1.0 11/20/2011 1404     Intake/Output Summary (Last 24 hours) at  11/10/2018 0802 Last data filed at 11/10/2018 [id number] Gross per 24 hour  Intake 2216.4 ml  Output 1175 ml  Net 1041.4 ml     Assessment:  38 y.o. male is s/p:  Aortobifemoral bypass grafting  2 Days Post-Op  Plan: -Cardiac:  Hemodynamically stable -Pulmonary:  O2 sats 89-96% on RA.  Continue to use IS and mobilize -GI:  Denies any N/V or flatus.  Will d/w Dr. Arbie CookeyEarly about advancing to clears.  Suppository this morning.  -Neuro:  In tact.   -Renal:  Good uop & creatinine looked good yesterday.  If diet advanced, will cut back on IVF.  May need gentle diuresis as he is 11k positive.  -Heme/ID:  Acute surgical blood loss anemia-stable and tolerating.  Incisions look good and healing nicely.   -General:  Walked about 5275ft yesterday.  Continue to mobilize today.  Discussed with pt the importance of this.  Continue PCA today until taking po pain medication -smoking cessation:  Discussed importance of stopping smoking and he expressed desire to quit as he has head start on quitting cold [place]. -DVT prophylaxis:  Okay to restart Lovenox.  Leontine Locket, PA-C [facility] [phone] 11/10/2018 8:02 AM  I have examined the patient, reviewed and agree with above.  Comfortable this evening.  Has been up walking.  No nausea or vomiting.  Will start clear liquids  Curt Jews, MD 11/10/2018 5:49 PM

## 2018-11-11 LAB — CBC
HCT: 40.8 % (ref 39.0–52.0)
Hemoglobin: 13.9 g/dL (ref 13.0–17.0)
MCH: 31.4 pg (ref 26.0–34.0)
MCHC: 34.1 g/dL (ref 30.0–36.0)
MCV: 92.1 fL (ref 80.0–100.0)
Platelets: 147 10*3/uL — ABNORMAL LOW (ref 150–400)
RBC: 4.43 MIL/uL (ref 4.22–5.81)
RDW: 11.1 % — ABNORMAL LOW (ref 11.5–15.5)
WBC: 6.3 10*3/uL (ref 4.0–10.5)
nRBC: 0 % (ref 0.0–0.2)

## 2018-11-11 MED ORDER — BISACODYL 10 MG RE SUPP: 10.0000 mg | Freq: Once | RECTAL | Status: DC

## 2018-11-11 MED ORDER — OXYCODONE-ACETAMINOPHEN 5-325 MG PO TABS: 1.0000 | ORAL_TABLET | Freq: Four times a day (QID) | ORAL | 0 refills | Status: DC | PRN

## 2018-11-11 MED ORDER — OXYCODONE-ACETAMINOPHEN 5-325 MG PO TABS
1.0000 | ORAL_TABLET | Freq: Four times a day (QID) | ORAL | Status: DC | PRN
  Administered 2018-11-11 – 2018-11-12 (×4): 2 via ORAL
  Administered 2018-11-12: 1 via ORAL
  Filled 2018-11-11 (×5): qty 2

## 2018-11-11 NOTE — Progress Notes (Addendum)
  Progress Note    11/11/2018 7:47 AM 3 Days Post-Op  Subjective:  Still having pain.  Passing gas.  Walked yesterday.  Afebrile   Vitals:   11/11/18 0400 11/11/18 0454  BP:    Pulse:    Resp: 20 20  Temp:    SpO2: 97% 97%    Physical Exam: Cardiac:  regular Lungs:  Non labored Incisions:  Laparotomy incision and bilateral groin incisions are clean and dry Extremities:  Palpable PT pulses bilaterally Abdomen:  Soft, NT/ND; +flatus; -N/V; -BM  CBC    Component Value Date/Time   WBC 6.3 11/11/2018 0305   RBC 4.43 11/11/2018 0305   HGB 13.9 11/11/2018 0305   HGB 16.8 06/20/2016 1429   HCT 40.8 11/11/2018 0305   HCT 47.0 06/20/2016 1429   PLT 147 (L) 11/11/2018 0305   PLT 167 06/20/2016 1429   MCV 92.1 11/11/2018 0305   MCV 87 06/20/2016 1429   MCV 92 11/20/2011 1404   MCH 31.4 11/11/2018 0305   MCHC 34.1 11/11/2018 0305   RDW 11.1 (L) 11/11/2018 0305   RDW 12.2 (L) 06/20/2016 1429   RDW 12.5 11/20/2011 1404   LYMPHSABS 1.7 11/04/2018 1335   MONOABS 0.9 11/04/2018 1335   EOSABS 0.2 11/04/2018 1335   BASOSABS 0.0 11/04/2018 1335    BMET    Component Value Date/Time   NA 140 11/09/2018 1750   NA 143 06/20/2016 1429   NA 140 11/20/2011 1404   K 4.1 11/09/2018 1750   K 4.0 11/20/2011 1404   CL 104 11/09/2018 1750   CL 104 11/20/2011 1404   CO2 26 11/09/2018 1750   CO2 29 11/20/2011 1404   GLUCOSE 89 11/09/2018 1750   GLUCOSE 142 (H) 11/20/2011 1404   BUN 10 11/09/2018 1750   BUN 13 06/20/2016 1429   BUN 14 11/20/2011 1404   CREATININE 1.00 11/09/2018 1750   CREATININE 1.40 (H) 11/20/2011 1404   CALCIUM 9.1 11/09/2018 1750   CALCIUM 9.2 11/20/2011 1404   GFRNONAA >60 11/09/2018 1750   GFRNONAA >60 11/20/2011 1404   GFRAA >60 11/09/2018 1750   GFRAA >60 11/20/2011 1404    INR    Component Value Date/Time   INR 1.2 11/08/2018 1426   INR 1.0 11/20/2011 1404     Intake/Output Summary (Last 24 hours) at 11/11/2018 0747 Last data filed at  11/11/2018 0600 Gross per 24 hour  Intake 2696.01 ml  Output 2500 ml  Net 196.01 ml     Assessment:  38 y.o. male is s/p:  Aortobifemoral bypass grafting  3 Days Post-Op  Plan: -pt doing well this am-passing flatus.  Tolerated liquid diet last evening without N/V.  Will advance diet this morning.  Will give suppository.   -possibly home tomorrow -DVT prophylaxis:  Sq heparin.   Leontine Locket, PA-C [facility] [phone] 11/11/2018 7:47 AM   I have examined the patient, reviewed and agree with above.  Curt Jews, MD 11/11/2018 10:13 AM

## 2018-11-11 NOTE — Progress Notes (Signed)
Dilaudid pca syringe wasted with 22 mg in stericycle. Witnessed by [employer].  Jerald Kief, RN

## 2018-11-11 NOTE — Progress Notes (Signed)
Occupational Therapy Treatment Patient Details Name: Brandon Parks MRN: [id number] DOB: 1981-02-24 Today's Date: 11/11/2018    History of present illness Pt is a 38 y/o male presenting with L leg pain and found with acute occulsion of L iliac artery stent. S/P aortobifemoral bypass 11/08/18. PMH: CABG, B iliac stenting, CAD, MI, NSTEMI, PAD.    OT comments  Pt ambulating from bathroom with no AD upon arrival. Pt reports that he did not call NT for assist to bathroom. This session, pt educated on safety and havingless pain when sitting and standing  from higher surfaces during transfers to toilet, etc... Pt also educated on compensatory techniques for LB ADLs and min A level he will need to bathe below  knees and to initiate donning LB clothing. Pt verbalized understanding. OT will continue to follow acutely  Follow Up Recommendations  No OT follow up;Supervision - Intermittent    Equipment Recommendations  3 in 1 bedside commode;Other (comment)    Recommendations for Other Services      Precautions / Restrictions Precautions Precautions: None Restrictions Weight Bearing Restrictions: No       Mobility Bed Mobility               General bed mobility comments: pt walking from bathroom upon arrival  Transfers   Equipment used: None Transfers: (stand - sit) Sit to Stand: Supervision         General transfer comment: cues for hand placement, increased time due to pain. Pt sat in chair after ambulating from bathroom with no AD and stated that he did not call for assist    Balance Overall balance assessment: Needs assistance Sitting-balance support: No upper extremity supported;Feet supported Sitting balance-Leahy Scale: Fair Sitting balance - Comments: limited dynamic balance due to pain    Standing balance support: Bilateral upper extremity supported;During functional activity;No upper extremity supported Standing balance-Leahy Scale: Fair                              ADL either performed or assessed with clinical judgement   ADL Overall ADL's : Needs assistance/impaired                                       General ADL Comments: pt educated on safety and havingless pain when sitting and standing  from higher surfaces during transfers to toilet, etc... Pt also educate on compensatory techniques for LB ADLs and min A level he will need to bathe below  knees and to initiate donning LB clothing. Pt verbalized understanding     Vision Patient Visual Report: No change from baseline     Perception     Praxis      Cognition Arousal/Alertness: Awake/alert Behavior During Therapy: WFL for tasks assessed/performed Overall Cognitive Status: Within Functional Limits for tasks assessed                                          Exercises     Shoulder Instructions       General Comments      Pertinent Vitals/ Pain       Pain Score: 7  Pain Location: abdominal/groin  Pain Descriptors / Indicators: Burning;Sore Pain Intervention(s): Monitored during session;Repositioned  Home Living  Prior Functioning/Environment              Frequency  Min 2X/week        Progress Toward Goals  OT Goals(current goals can now be found in the care plan section)  Progress towards OT goals: Progressing toward goals     Plan Discharge plan remains appropriate    Co-evaluation                 AM-PAC OT "6 Clicks" Daily Activity     Outcome Measure   Help from another person eating meals?: None Help from another person taking care of personal grooming?: A Little Help from another person toileting, which includes using toliet, bedpan, or urinal?: None Help from another person bathing (including washing, rinsing, drying)?: A Little Help from another person to put on and taking off regular upper body clothing?: None Help from another  person to put on and taking off regular lower body clothing?: A Little 6 Click Score: 21    End of Session    OT Visit Diagnosis: Other abnormalities of gait and mobility (R26.89);Pain Pain - part of body: (abdominal incision)   Activity Tolerance Patient tolerated treatment well   Patient Left in chair;with call bell/phone within reach   Nurse Communication          Time: [id number] OT Time Calculation (min): 17 min  Charges: OT General Charges $OT Visit: 1 Visit OT Treatments $Self Care/Home Management : 8-22 mins     Galen ManilaSpencer, Axel Frisk Jeanette 11/11/2018, 12:42 PM

## 2018-11-11 NOTE — Plan of Care (Signed)
  Problem: Education: Goal: Knowledge of General Education information will improve Description Including pain rating scale, medication(s)/side effects and non-pharmacologic comfort measures Outcome: Progressing   Problem: Health Behavior/Discharge Planning: Goal: Ability to manage health-related needs will improve Outcome: Progressing   

## 2018-11-12 LAB — CBC
HCT: 39.7 % (ref 39.0–52.0)
Hemoglobin: 13.5 g/dL (ref 13.0–17.0)
MCH: 30.9 pg (ref 26.0–34.0)
MCHC: 34 g/dL (ref 30.0–36.0)
MCV: 90.8 fL (ref 80.0–100.0)
Platelets: 157 10*3/uL (ref 150–400)
RBC: 4.37 MIL/uL (ref 4.22–5.81)
RDW: 11.3 % — ABNORMAL LOW (ref 11.5–15.5)
WBC: 5.1 10*3/uL (ref 4.0–10.5)
nRBC: 0 % (ref 0.0–0.2)

## 2018-11-12 NOTE — Progress Notes (Addendum)
[facility] [place]  Subjective  - Doing better each day.  Tolerated solids PO and ambulated.  Ready to go home.   Objective 93/74 72 99.2 F (37.3 C) (Oral) 18 94%  Intake/Output Summary (Last 24 hours) at 11/12/2018 0744 Last data filed at 11/11/2018 1500 Gross per 24 hour  Intake 177.91 ml  Output -  Net 177.91 ml    Palpable PT pulses B LE Abdomin soft with healing incision, B groins soft with healing incisions. Lungs non labored breathing Gen NAD  Assessment/Planning: POD # 4 Aortobifemoral bypass grafting   Plan to discharge home today Discharge on Crestor,Asa and Plavix  No f/u PT or OT recommended F/U with Dr. Donnetta Hutching in 3- 4 weeks    Roxy Horseman 11/12/2018 7:44 AM --  Laboratory Lab Results: Recent Labs    11/11/18 0305 11/12/18 0410  WBC 6.3 5.1  HGB 13.9 13.5  HCT 40.8 39.7  PLT 147* 157   BMET Recent Labs    11/09/18 1750  NA 140  K 4.1  CL 104  CO2 26  GLUCOSE 89  BUN 10  CREATININE 1.00  CALCIUM 9.1    COAG Lab Results  Component Value Date   INR 1.2 11/08/2018   INR 1.1 11/04/2018   INR 1.0 06/20/2016   No results found for: PTT

## 2018-11-12 NOTE — Progress Notes (Signed)
Delivered belongings from security back to patient (black wallet with [phone] and a [employer] bank card) in the unopened clear packaging. Yellow sheet placed back in chart.

## 2018-11-12 NOTE — Progress Notes (Signed)
Discharge AVS meds take and those due reviewed with pt. Follow up appointments and when to call MD reviewed. All questions and concerns addressed. No further questions at this time. Belongings returned to pt from security. D/c IV and TELE, CCMD notified. D/C home per orders. Brought down via wheelchair with staff.  Amanda Cockayne, RN

## 2018-11-25 NOTE — Discharge Summary (Signed)
[facility] Discharge Summary   Patient ID:  Brandon Parks MRN: [id number] DOB/AGE: 38-Jun-1982 38 y.o.  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 CAD (coronary artery disease)   . Carotid artery occlusion   . GERD (gastroesophageal reflux disease)   . MI (myocardial infarction) ([place]) 2013  . NSTEMI (non-ST elevated myocardial infarction) ([place])   . PAD (peripheral artery disease) ([place])   . Pneumothorax on left   . Tendinitis    bilateral    Procedure(s): AORTA BIFEMORAL BYPASS GRAFT USING HEMASHIELD GOLD 14X8MM VASCULAR GRAFT  Discharged Condition: stable  HPI: Seen in consult 11/04/2018: This is a 38 y.o. male who is a pt of Dr. Fletcher Anon and has undergone bilateral iliac stenting in the past.  This past Saturday, the developed pain in the left leg.  He states he is only able to walk about 20-30 feet before he develops pain.  He states if he tries to walk through it, his leg locks up. He does not have any rest pain at this time.  He does not have pain in the right leg.  His plavix has been discontinued and plan for heparin gtt for worry of thrombosis of stent and embolization.  He was scheduled for aortobifemoral bypass graft.   Hospital Course:  Brandon Parks is a 38 y.o. male is S/P  Procedure(s): AORTA BIFEMORAL BYPASS GRAFT USING HEMASHIELD GOLD 14X8MM VASCULAR GRAFT   Consults:  Treatment Team:  Serafina Mitchell, MD Early, Arvilla Meres, MD   Post op he had palpable pulses B LE, incision healing well.  Pain control issues requiring PCA.  Tolerated liquid diet last evening without N/V. On POD 3.  He  was discharged on day 4 with pain well controlled on PO medications, Crestor, ASA and Plavix.    Significant Diagnostic Studies: CBC Lab Results  Component Value Date   WBC 5.1 11/12/2018   HGB 13.5 11/12/2018   HCT 39.7 11/12/2018   MCV 90.8 11/12/2018   PLT 157 11/12/2018    BMET    Component Value Date/Time   NA 140 11/09/2018 1750   NA 143 06/20/2016 1429   NA 140 11/20/2011 1404   K 4.1 11/09/2018 1750   K 4.0 11/20/2011 1404   CL 104 11/09/2018 1750   CL 104 11/20/2011 1404   CO2 26 11/09/2018 1750   CO2 29 11/20/2011 1404   GLUCOSE 89 11/09/2018 1750   GLUCOSE 142 (H) 11/20/2011 1404   BUN 10 11/09/2018 1750   BUN 13 06/20/2016 1429   BUN 14 11/20/2011 1404   CREATININE 1.00 11/09/2018 1750   CREATININE 1.40 (H) 11/20/2011 1404   CALCIUM 9.1 11/09/2018 1750   CALCIUM 9.2 11/20/2011 1404   GFRNONAA >60 11/09/2018 1750   GFRNONAA >60 11/20/2011 1404   GFRAA >60 11/09/2018 1750   GFRAA >60 11/20/2011 1404   COAG Lab Results  Component Value Date   INR 1.2 11/08/2018   INR 1.1 11/04/2018   INR 1.0 06/20/2016     Disposition:  Discharge to :Home Discharge Instructions    Call MD for:  redness, tenderness, or signs of infection (pain, swelling, bleeding, redness, odor or green/yellow discharge around  incision site)   Complete by: As directed    Call MD for:  severe or increased pain, loss or decreased feeling  in affected limb(s)   Complete by: As directed    Call MD for:  temperature >100.5   Complete by: As directed    Resume previous diet   Complete by: As directed      Allergies as of 11/12/2018   No Known Allergies     Medication List    TAKE these medications   aspirin EC 81 MG tablet Take 1 tablet (81 mg total) by mouth daily.   clopidogrel 75 MG tablet Commonly known as: PLAVIX TAKE 1 TABLET BY MOUTH  DAILY   nitroGLYCERIN 0.4 MG SL tablet Commonly known as: NITROSTAT Place 1 tablet (0.4 mg total) under the tongue every 5 (five)  minutes as needed.   oxyCODONE-acetaminophen 5-325 MG tablet Commonly known as: PERCOCET/ROXICET Take 1-2 tablets by mouth every 6 (six) hours as needed for moderate pain.   rosuvastatin 40 MG tablet Commonly known as: CRESTOR Take 1 tablet (40 mg total) by mouth daily.      Verbal and written Discharge instructions given to the patient. Wound care per Discharge AVS Follow-up Information    Early, Kristen Loaderodd F, MD In 3 weeks.   Specialties: Vascular Surgery, Cardiology Why: Office will call you to arrange your appt (sent) Contact information: [address] [place] [address] [phone]           Signed: Mosetta Pigeonmma Maureen Elder Davidian 11/25/2018, 9:34 AM

## 2018-11-28 ENCOUNTER — Other Ambulatory Visit: Payer: Self-pay | Admitting: Cardiovascular Disease

## 2018-12-07 ENCOUNTER — Ambulatory Visit (INDEPENDENT_AMBULATORY_CARE_PROVIDER_SITE_OTHER): Payer: Self-pay | Admitting: Vascular Surgery

## 2018-12-07 ENCOUNTER — Other Ambulatory Visit: Payer: Self-pay

## 2018-12-07 ENCOUNTER — Encounter: Payer: Self-pay | Admitting: Vascular Surgery

## 2018-12-07 VITALS — BP 113/78 | HR 74 | Temp 96.6°F | Resp 16 | Ht 74.0 in | Wt 224.0 lb

## 2018-12-07 DIAGNOSIS — I739 Peripheral vascular disease, unspecified: Secondary | ICD-10-CM

## 2018-12-07 NOTE — Progress Notes (Signed)
   Patient name: Brandon Parks MRN: [id number] DOB: 09/06/1980 Sex: male  REASON FOR VISIT: Follow-up aortobifemoral bypass for severe lower extremity occlusive disease  HPI: Brandon Parks is a 38 y.o. male here today for follow-up.  Underwent uneventful aortobifemoral bypass on 11/08/2018.  Did well in the hospital and was discharged.  He looks quite good today.  Reports that he is returned to his usual eating.  Reports that he is gained a 15 pounds that he lost around the time of surgery.  Is had no difficulty with bowel function.  He reports that over the last 2 weeks has regained his stamina as well.  He has had complete resolution of his preoperative claudication.  Current Outpatient Medications  Medication Sig Dispense Refill  . aspirin EC 81 MG tablet Take 1 tablet (81 mg total) by mouth daily. 90 tablet 3  . clopidogrel (PLAVIX) 75 MG tablet TAKE 1 TABLET BY MOUTH  DAILY 90 tablet 1  . nitroGLYCERIN (NITROSTAT) 0.4 MG SL tablet Place 1 tablet (0.4 mg total) under the tongue every 5 (five) minutes as needed. 25 tablet 6  . oxyCODONE-acetaminophen (PERCOCET/ROXICET) 5-325 MG tablet Take 1-2 tablets by mouth every 6 (six) hours as needed for moderate pain. (Patient not taking: Reported on 12/07/2018) 30 tablet 0  . rosuvastatin (CRESTOR) 40 MG tablet Take 1 tablet (40 mg total) by mouth daily. 90 tablet 3   No current facility-administered medications for this visit.      PHYSICAL EXAM: Vitals:   12/07/18 1532  BP: 113/78  Pulse: 74  Resp: 16  Temp: (!) 96.6 F (35.9 C)  TempSrc: Temporal  SpO2: 100%  Weight: 224 lb (101.6 kg)  Height: 6\' 2"  (1.88 m)    GENERAL: The patient is a well-nourished male, in no acute distress. The vital signs are documented above. Abdominal and groin incisions are well-healed with easily palpable femoral and dorsalis pedis pulses  MEDICAL ISSUES: Stable overall.  Will return to full activity other than  heavy lifting of over 20 to 30 pounds for total of 3 months following surgery.  He again had a discussion of the critical importance of smoking cessation.  He will discussed with Dr. Fletcher Anon of the possibility of adding a prescription drug for aiding this.  We will see him again in 9 months with repeat ankle arm index   Rosetta Posner, MD [facility] Tel [phone] Pager [phone]

## 2018-12-20 ENCOUNTER — Telehealth: Payer: Self-pay | Admitting: Cardiovascular Disease

## 2018-12-20 NOTE — Telephone Encounter (Signed)
I recommend Chantix.

## 2018-12-20 NOTE — Telephone Encounter (Signed)
Patient calling in to inquire if Dr. Fletcher Anon would be able to prescribe him medication to help patient quit smoking. Please advise when able

## 2018-12-21 NOTE — Telephone Encounter (Signed)
Called the patient to see which pharmacy he would like the Rx for Chantix sent. [email or web address].

## 2018-12-22 MED ORDER — VARENICLINE TARTRATE 0.5 MG PO TABS: 0.5000 mg | ORAL_TABLET | Freq: Two times a day (BID) | ORAL | 1 refills | Status: DC

## 2018-12-22 MED ORDER — CHANTIX STARTING MONTH PAK 0.5 MG X 11 & 1 MG X 42 PO TABS: ORAL_TABLET | ORAL | 0 refills | Status: DC

## 2018-12-22 NOTE — Telephone Encounter (Signed)
30 day starter pack for Chantix sent to the patient's pharmacy. Maintenance Rx sent in for 0.5mg  bid #60 R-1.

## 2018-12-22 NOTE — Telephone Encounter (Signed)
Patient called and stated he would like it to be sent to:  [facility] in [place]

## 2019-05-09 ENCOUNTER — Other Ambulatory Visit: Payer: Self-pay | Admitting: Cardiovascular Disease

## 2019-07-29 ENCOUNTER — Other Ambulatory Visit: Payer: Self-pay | Admitting: Cardiovascular Disease

## 2019-08-15 ENCOUNTER — Other Ambulatory Visit: Payer: Self-pay

## 2019-08-15 ENCOUNTER — Emergency Department
Admission: EM | Admit: 2019-08-15 | Discharge: 2019-08-15 | Disposition: A | Discharge status: Home / Self Care | Payer: 59 | Attending: Emergency Medicine | Admitting: Emergency Medicine

## 2019-08-15 DIAGNOSIS — Z7901 Long term (current) use of anticoagulants: Secondary | ICD-10-CM | POA: Diagnosis not present

## 2019-08-15 DIAGNOSIS — L03114 Cellulitis of left upper limb: Secondary | ICD-10-CM | POA: Insufficient documentation

## 2019-08-15 DIAGNOSIS — F17210 Nicotine dependence, cigarettes, uncomplicated: Secondary | ICD-10-CM | POA: Insufficient documentation

## 2019-08-15 DIAGNOSIS — Z79899 Other long term (current) drug therapy: Secondary | ICD-10-CM | POA: Diagnosis not present

## 2019-08-15 DIAGNOSIS — I2510 Atherosclerotic heart disease of native coronary artery without angina pectoris: Secondary | ICD-10-CM | POA: Insufficient documentation

## 2019-08-15 DIAGNOSIS — R2232 Localized swelling, mass and lump, left upper limb: Secondary | ICD-10-CM | POA: Diagnosis present

## 2019-08-15 DIAGNOSIS — Z7982 Long term (current) use of aspirin: Secondary | ICD-10-CM | POA: Insufficient documentation

## 2019-08-15 DIAGNOSIS — Z951 Presence of aortocoronary bypass graft: Secondary | ICD-10-CM | POA: Diagnosis not present

## 2019-08-15 DIAGNOSIS — I10 Essential (primary) hypertension: Secondary | ICD-10-CM | POA: Insufficient documentation

## 2019-08-15 LAB — CBC
HCT: 51.3 % (ref 39.0–52.0)
Hemoglobin: 17 g/dL (ref 13.0–17.0)
MCH: 31.4 pg (ref 26.0–34.0)
MCHC: 33.1 g/dL (ref 30.0–36.0)
MCV: 94.6 fL (ref 80.0–100.0)
Platelets: 148 10*3/uL — ABNORMAL LOW (ref 150–400)
RBC: 5.42 MIL/uL (ref 4.22–5.81)
RDW: 11.8 % (ref 11.5–15.5)
WBC: 7.4 10*3/uL (ref 4.0–10.5)
nRBC: 0 % (ref 0.0–0.2)

## 2019-08-15 LAB — BASIC METABOLIC PANEL
Anion gap: 10 (ref 5–15)
BUN: 15 mg/dL (ref 6–20)
CO2: 24 mmol/L (ref 22–32)
Calcium: 9.3 mg/dL (ref 8.9–10.3)
Chloride: 105 mmol/L (ref 98–111)
Creatinine, Ser: 1.03 mg/dL (ref 0.61–1.24)
GFR calc Af Amer: >60 mL/min (ref >60)
GFR calc non Af Amer: >60 mL/min (ref >60)
Glucose, Bld: 95 mg/dL (ref 70–99)
Potassium: 4.5 mmol/L (ref 3.5–5.1)
Sodium: 139 mmol/L (ref 135–145)

## 2019-08-15 LAB — LACTIC ACID, PLASMA: Lactic Acid, Venous: 0.7 mmol/L (ref 0.5–1.9)

## 2019-08-15 MED ORDER — CEPHALEXIN 500 MG PO CAPS
500.0000 mg | ORAL_CAPSULE | Freq: Three times a day (TID) | ORAL | 0 refills | Status: DC
Start: 2019-08-15 — End: 2019-12-22

## 2019-08-15 MED ORDER — SULFAMETHOXAZOLE-TRIMETHOPRIM 800-160 MG PO TABS
1.0000 | ORAL_TABLET | Freq: Two times a day (BID) | ORAL | 0 refills | Status: DC
Start: 2019-08-15 — End: 2019-12-22

## 2019-08-15 NOTE — ED Triage Notes (Signed)
Pt states noticed L arm swelling and itching. Took childrens bendaryl (didn't have anything else). Wife circled where redness was this AM. Redness has grown since this AM. Pt states L arm is sore. Noted to R upper arm, inside bicep. Unsure what bite pt. Pt states is outdoors a lot and was recently crawling under a house. A&O, ambulatory. VSS.

## 2019-08-15 NOTE — ED Notes (Signed)
Rainbow, lactic on ice, and first set of cultures sent to lab at this time

## 2019-08-15 NOTE — ED Provider Notes (Signed)
[facility] Emergency Department Provider Note  ____________________________________________  Time seen: Approximately 6:30 PM  I have reviewed the triage vital signs and the nursing notes.   HISTORY  Chief Complaint Cellulitis and Insect Bite    HPI Brandon Parks is a 39 y.o. male with a history of CAD, GERD who comes the ED today complaining of left arm swelling and discomfort that started gradually, progressing over last 24 hours.  His wife encircled the lesion with a pen this morning, and is noticed the redness expands beyond that throughout the day.  Denies chest pain shortness of breath fevers or chills.  No vomiting.  Arm discomfort is constant, no aggravating or alleviating factors, nonradiating.      Past Medical History:  Diagnosis Date  . CAD (coronary artery disease)   . Carotid artery occlusion   . GERD (gastroesophageal reflux disease)   . MI (myocardial infarction) ([place]) 2013  . NSTEMI (non-ST elevated myocardial infarction) ([place])   . PAD (peripheral artery disease) ([place])   . Pneumothorax on left   . Tendinitis    bilateral     Patient Active Problem List   Diagnosis Date Noted  . PAD (peripheral artery disease) ([place]) 02/26/2015  . Tobacco use 02/26/2015  . Sternum pain 12/30/2012  . Hyperlipidemia 06/11/2012  . Essential hypertension 06/11/2012  . CAD (coronary artery disease) 11/28/2011  . 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 ABDOMINAL AORTOGRAM N/A 06/25/2016   Procedure: Abdominal Aortogram;  Surgeon: Wellington Hampshire, MD;  Location: [place] [facility];  Service: Cardiovascular;  Laterality: N/A;  . ABDOMINAL AORTOGRAM W/LOWER EXTREMITY N/A 09/01/2018   Procedure: ABDOMINAL AORTOGRAM W/LOWER EXTREMITY;  Surgeon: Wellington Hampshire, MD;  Location: [place] [facility];  Service: Cardiovascular;  Laterality: N/A;  . AORTA - BILATERAL FEMORAL ARTERY BYPASS GRAFT Bilateral  11/08/2018   Procedure: AORTA BIFEMORAL BYPASS GRAFT USING HEMASHIELD GOLD 14X8MM VASCULAR GRAFT;  Surgeon: Rosetta Posner, MD;  Location: [place];  Service: Vascular;  Laterality: Bilateral;  . APPENDECTOMY    . CARDIAC CATHETERIZATION  2013   @ [place]; [place]  . CARDIAC CATHETERIZATION  Aug 2013   @ [place]  . CORONARY ANGIOPLASTY  2013   @[facility]   . CORONARY ARTERY BYPASS GRAFT  2013   @ [place]  . LOWER EXTREMITY ANGIOGRAPHY N/A 06/25/2016   Procedure: Lower Extremity Angiography;  Surgeon: Wellington Hampshire, MD;  Location: [place] [facility];  Service: Cardiovascular;  Laterality: N/A;  Limited study  . PERIPHERAL VASCULAR CATHETERIZATION N/A 02/28/2015   Procedure: Abdominal Aortogram;  Surgeon: Wellington Hampshire, MD;  Location: [place] [facility];  Service: Cardiovascular;  Laterality: N/A;  . PERIPHERAL VASCULAR INTERVENTION Bilateral 06/25/2016   Procedure: Peripheral Vascular Intervention;  Surgeon: Wellington Hampshire, MD;  Location: [place] [facility];  Service: Cardiovascular;  Laterality: Bilateral;  Bilateral Kissing Iliac stents  . PERIPHERAL VASCULAR INTERVENTION Bilateral 09/01/2018   Procedure: PERIPHERAL VASCULAR INTERVENTION;  Surgeon: Wellington Hampshire, MD;  Location: [place] [facility];  Service: Cardiovascular;  Laterality: Bilateral;  bilateral common iliacs     Prior to Admission medications   Medication Sig Start Date End Date Taking? Authorizing Provider  aspirin EC 81 MG tablet Take 1 tablet (81 mg total) by mouth daily. 08/17/18   Wellington Hampshire, MD  cephALEXin (KEFLEX) 500 MG capsule Take 1 capsule (500 mg total) by mouth 3 (three) times daily.  08/15/19   Sharman Cheek, MD  clopidogrel (PLAVIX) 75 MG tablet Take 1 tablet (75 mg total) by mouth daily. PLEASE KEEP UPCOMING APPOINTMENT FOR FUTURE REFILLS. 08/01/19   Iran Ouch, MD  nitroGLYCERIN (NITROSTAT) 0.4 MG SL tablet Place 1 tablet (0.4 mg total) under the tongue every 5 (five) minutes as needed. 02/03/14   Antonieta Iba, MD  oxyCODONE-acetaminophen (PERCOCET/ROXICET) 5-325 MG tablet Take 1-2 tablets by mouth every 6 (six) hours as needed for moderate pain. Patient not taking: Reported on 12/07/2018 11/11/18   Dara Lords, PA-C  rosuvastatin (CRESTOR) 40 MG tablet Take 1 tablet (40 mg total) by mouth daily. 08/17/18 11/15/18  Iran Ouch, MD  sulfamethoxazole-trimethoprim (BACTRIM DS) 800-160 MG tablet Take 1 tablet by mouth 2 (two) times daily. 08/15/19   Sharman Cheek, MD  varenicline (CHANTIX STARTING MONTH PAK) 0.5 MG X 11 & 1 MG X 42 tablet Take one 0.5 mg tablet by mouth once daily for 3 days, then increase to one 0.5 mg tablet twice daily for 4 days, then increase to one 1 mg tablet twice daily. 12/22/18   Iran Ouch, MD  varenicline (CHANTIX) 0.5 MG tablet Take 1 tablet (0.5 mg total) by mouth 2 (two) times daily. 12/22/18   Iran Ouch, MD     Allergies Patient has no known allergies.   Family History  Family history unknown: Yes    Social History Social History   Tobacco Use  . Smoking status: Current Every Day Smoker    Packs/day: 0.25    Years: 7.00    Pack years: 1.75    Types: Cigarettes  . Smokeless tobacco: Never Used  . Tobacco comment: 5-6 a day  Substance Use Topics  . Alcohol use: Yes    Alcohol/week: 2.0 standard drinks    Types: 2 Cans of beer per week    Comment: ocassionally  . Drug use: Not Currently    Types: Marijuana    Comment: past    Review of Systems  Constitutional:   No fever or chills.  ENT:   No sore throat. No rhinorrhea. Cardiovascular:   No chest pain or syncope. Respiratory:   No dyspnea or cough. Gastrointestinal:   Negative for abdominal pain, vomiting and diarrhea.  Musculoskeletal:   Left arm pain and swelling as above All other systems reviewed and are negative except as documented above in ROS and HPI.  ____________________________________________   PHYSICAL EXAM:  VITAL SIGNS: ED Triage Vitals  Enc  Vitals Group     BP 08/15/19 1542 125/90     Pulse Rate 08/15/19 1542 68     Resp 08/15/19 1542 16     Temp 08/15/19 1542 98.4 F (36.9 C)     Temp Source 08/15/19 1542 Oral     SpO2 08/15/19 1542 100 %     Weight 08/15/19 1543 240 lb (108.9 kg)     Height 08/15/19 1543 6\' 2"  (1.88 m)     Head Circumference --      Peak Flow --      Pain Score 08/15/19 1543 4     Pain Loc --      Pain Edu? --      Excl. in GC? --     Vital signs reviewed, nursing assessments reviewed.   Constitutional:   Alert and oriented. Non-toxic appearance. Eyes:   Conjunctivae are normal. EOMI. ENT      Head:   Normocephalic and atraumatic.  Mouth/Throat:   MMM      Neck:   No meningismus. Full ROM. Hematological/Lymphatic/Immunilogical:   No cervical lymphadenopathy. Cardiovascular:   RRR.  Respiratory:   Normal respiratory effort without tachypnea/retractions Musculoskeletal:   Normal range of motion in all extremities.  No edema.  There is a 5 cm irregular erythematous lesion on the distal upper left arm medially with warmth tenderness erythema induration.  No fluctuance or crepitus, no open wound, no drainage. Neurologic:   Normal speech and language.  Motor grossly intact. No acute focal neurologic deficits are appreciated.  Skin:    Skin is warm, dry and intact.  Left arm lesion as noted above, otherwise no rash.  No wounds.  ____________________________________________    LABS (pertinent positives/negatives) (all labs ordered are listed, but only abnormal results are displayed) Labs Reviewed  CBC - Abnormal; Notable for the following components:      Result Value   Platelets 148 (*)    All other components within normal limits  CULTURE, BLOOD (ROUTINE X 2)  CULTURE, BLOOD (ROUTINE X 2)  BASIC METABOLIC PANEL  LACTIC ACID, PLASMA   ____________________________________________   EKG  ____________________________________________    RADIOLOGY  No results  found.  ____________________________________________   PROCEDURES Procedures  ____________________________________________  CLINICAL IMPRESSION / ASSESSMENT AND PLAN / ED COURSE  Pertinent labs & imaging results that were available during my care of the patient were reviewed by me and considered in my medical decision making (see chart for details).  Brandon Parks was evaluated in Emergency Department on 08/15/2019 for the symptoms described in the history of present illness. He was evaluated in the context of the global COVID-19 pandemic, which necessitated consideration that the patient might be at risk for infection with the SARS-CoV-2 virus that causes COVID-19. Institutional protocols and algorithms that pertain to the evaluation of patients at risk for COVID-19 are in a state of rapid change based on information released by regulatory bodies including the CDC and federal and state organizations. These policies and algorithms were followed during the patient's care in the ED.   Patient presents with left arm pain and swelling, clinically apparent cellulitis on exam.  No evidence of abscess or necrotizing fasciitis or deeper space infection.  Compartments are soft.  Vital signs are normal, patient is nontoxic with reassuring exam.  Labs were obtained in triage which are all normal.  Patient is stable for discharge home on Keflex and Bactrim.      ____________________________________________   FINAL CLINICAL IMPRESSION(S) / ED DIAGNOSES    Final diagnoses:  Cellulitis of left upper extremity     ED Discharge Orders         Ordered    cephALEXin (KEFLEX) 500 MG capsule  3 times daily     08/15/19 1830    sulfamethoxazole-trimethoprim (BACTRIM DS) 800-160 MG tablet  2 times daily     08/15/19 1830          Portions of this note were generated with dragon dictation software. Dictation errors may occur despite best attempts at proofreading.   Sharman Cheek,  MD 08/15/19 [phone]

## 2019-08-20 LAB — CULTURE, BLOOD (ROUTINE X 2)
Culture: NO GROWTH
Special Requests: ADEQUATE

## 2019-08-23 ENCOUNTER — Other Ambulatory Visit: Payer: Self-pay | Admitting: Cardiovascular Disease

## 2019-08-23 DIAGNOSIS — I739 Peripheral vascular disease, unspecified: Secondary | ICD-10-CM

## 2019-08-31 ENCOUNTER — Other Ambulatory Visit: Payer: Self-pay

## 2019-08-31 ENCOUNTER — Ambulatory Visit (INDEPENDENT_AMBULATORY_CARE_PROVIDER_SITE_OTHER): Payer: 59

## 2019-08-31 DIAGNOSIS — I739 Peripheral vascular disease, unspecified: Secondary | ICD-10-CM | POA: Diagnosis not present

## 2019-08-31 DIAGNOSIS — Z95820 Peripheral vascular angioplasty status with implants and grafts: Secondary | ICD-10-CM

## 2019-09-01 ENCOUNTER — Other Ambulatory Visit: Payer: Self-pay

## 2019-09-01 DIAGNOSIS — I739 Peripheral vascular disease, unspecified: Secondary | ICD-10-CM

## 2019-09-05 ENCOUNTER — Other Ambulatory Visit: Payer: Self-pay | Admitting: Cardiovascular Disease

## 2019-09-08 ENCOUNTER — Ambulatory Visit: Payer: 59 | Admitting: Cardiovascular Disease

## 2019-10-18 ENCOUNTER — Other Ambulatory Visit: Payer: Self-pay | Admitting: Cardiovascular Disease

## 2019-12-22 ENCOUNTER — Other Ambulatory Visit: Payer: Self-pay

## 2019-12-22 ENCOUNTER — Ambulatory Visit: Payer: 59 | Admitting: Cardiovascular Disease

## 2019-12-22 ENCOUNTER — Encounter: Payer: Self-pay | Admitting: Cardiovascular Disease

## 2019-12-22 VITALS — BP 120/66 | HR 73 | Ht 74.0 in | Wt 251.0 lb

## 2019-12-22 DIAGNOSIS — E785 Hyperlipidemia, unspecified: Secondary | ICD-10-CM | POA: Diagnosis not present

## 2019-12-22 DIAGNOSIS — I10 Essential (primary) hypertension: Secondary | ICD-10-CM

## 2019-12-22 DIAGNOSIS — I739 Peripheral vascular disease, unspecified: Secondary | ICD-10-CM

## 2019-12-22 DIAGNOSIS — I2510 Atherosclerotic heart disease of native coronary artery without angina pectoris: Secondary | ICD-10-CM

## 2019-12-22 DIAGNOSIS — Z720 Tobacco use: Secondary | ICD-10-CM

## 2019-12-22 NOTE — Progress Notes (Signed)
Cardiology Office Note   Date:  12/22/2019   ID:  Brandon Parks, DOB 02-08-1981, MRN [id number]  PCP:  Patient, No Pcp Per  Cardiologist:   Lorine Bears, MD   Chief Complaint  Patient presents with  . Other    6 month follow up. meds reviewed verbally with patient.       History of Present Illness: Brandon Parks is a 39 y.o. male who presents for a follow-up visit of  peripheral arterial disease and coronary artery disease. He has known history of coronary artery disease status post CABG in July 2013 for ostial LAD plaque rupture with extensive thrombus and possible dissection , tobacco use and hyperlipidemia.  He has known history of peripheral arterial disease with multiple endovascular interventions on his iliac arteries and subsequent aortobifemoral bypass in July 2020. Most recent noninvasive vascular studies in May showed normal ABI and toe pressure bilaterally. Aortoiliac duplex showed patent aortobifemoral bypass. He has been doing very well with no recent chest pain, shortness of breath or palpitations.  No lower extremity claudication.  He cut down on tobacco use to 5 cigarettes a day but has not been able to quit completely.  Past Medical History:  Diagnosis Date  . CAD (coronary artery disease)   . Carotid artery occlusion   . GERD (gastroesophageal reflux disease)   . MI (myocardial infarction) ([facility]) 2013  . NSTEMI (non-ST elevated myocardial infarction) ([facility])   . PAD (peripheral artery disease) ([facility])   . Pneumothorax on left   . Tendinitis    bilateral    Past Surgical History:  Procedure Laterality Date  . ABDOMINAL AORTOGRAM N/A 06/25/2016   Procedure: Abdominal Aortogram;  Surgeon: Iran Ouch, MD;  [facility];  Service: Cardiovascular;  Laterality: N/A;  . ABDOMINAL AORTOGRAM W/LOWER EXTREMITY N/A 09/01/2018   Procedure: ABDOMINAL AORTOGRAM W/LOWER EXTREMITY;  Surgeon: Iran Ouch, MD;  [facility];  Service: Cardiovascular;  Laterality: N/A;  . AORTA - BILATERAL FEMORAL ARTERY BYPASS GRAFT Bilateral 11/08/2018   Procedure: AORTA BIFEMORAL BYPASS GRAFT USING HEMASHIELD GOLD 14X8MM VASCULAR GRAFT;  Surgeon: Larina Earthly, MD;  Location: [facility];  Service: Vascular;  Laterality: Bilateral;  . APPENDECTOMY    . CARDIAC CATHETERIZATION  2013   @ [facility]; [place]  . CARDIAC CATHETERIZATION  Aug 2013   @ [facility]  . CORONARY ANGIOPLASTY  2013   @[email or web address]   . CORONARY ARTERY BYPASS GRAFT  2013   @ [facility]  . LOWER EXTREMITY ANGIOGRAPHY N/A 06/25/2016   Procedure: Lower Extremity Angiography;  Surgeon: 08/25/2016, MD;  [facility];  Service: Cardiovascular;  Laterality: N/A;  Limited study  . PERIPHERAL VASCULAR CATHETERIZATION N/A 02/28/2015   Procedure: Abdominal Aortogram;  Surgeon: 13/12/2014, MD;  [facility];  Service: Cardiovascular;  Laterality: N/A;  . PERIPHERAL VASCULAR INTERVENTION Bilateral 06/25/2016   Procedure: Peripheral Vascular Intervention;  Surgeon: 08/25/2016, MD;  [facility];  Service: Cardiovascular;  Laterality: Bilateral;  Bilateral Kissing Iliac stents  . PERIPHERAL VASCULAR INTERVENTION Bilateral 09/01/2018   Procedure: PERIPHERAL VASCULAR INTERVENTION;  Surgeon: 09/03/2018, MD;  [facility]: [facility];  Service: Cardiovascular;  Laterality: Bilateral;  bilateral common iliacs     Current Outpatient Medications  Medication Sig Dispense Refill  . clopidogrel (PLAVIX) 75 MG tablet Take 1 tablet (75 mg total) by mouth daily. PLEASE KEEP UPCOMING APPOINTMENT FOR FUTURE  REFILLS. 90 tablet 3  . nitroGLYCERIN (NITROSTAT) 0.4 MG SL tablet Place 1 tablet (0.4 mg total) under the tongue every 5 (five) minutes as needed. 25 tablet 6  . rosuvastatin (CRESTOR) 40 MG tablet Take 1 tablet (40 mg total) by mouth daily. PLEASE KEEP UPCOMING APPOINTMENT FOR FURTHER REFILLS. 90 tablet 0   No current  facility-administered medications for this visit.    Allergies:   Patient has no known allergies.    Social History:  The patient  reports that he has been smoking cigarettes. He has a 1.75 pack-year smoking history. He has never used smokeless tobacco. He reports current alcohol use of about 2.0 standard drinks of alcohol per week. He reports previous drug use. Drug: Marijuana.   Family History:  The patient's Family history is unknown by patient.    ROS:  Please see the history of present illness.   Otherwise, review of systems are positive for none.   All other systems are reviewed and negative.    PHYSICAL EXAM: VS:  BP 120/66 (BP Location: Left Arm, Patient Position: Sitting, Cuff Size: Normal)   Pulse 73   Ht 6\' 2"  (1.88 m)   Wt 251 lb (113.9 kg)   SpO2 97%   BMI 32.23 kg/m  , BMI Body mass index is 32.23 kg/m. GEN: Well nourished, well developed, in no acute distress  HEENT: normal  Neck: no JVD, carotid bruits, or masses Cardiac: RRR; no murmurs, rubs, or gallops,no edema  Respiratory:  clear to auscultation bilaterally, normal work of breathing GI: soft, nontender, nondistended, + BS MS: no deformity or atrophy  Skin: warm and dry, no rash Neuro:  Strength and sensation are intact Psych: euthymic mood, full affect Vascular: Posterior tibial is palpable and normal bilaterally.  Dorsalis pedis is mildly diminished.  EKG:  EKG is  ordered today. EKG showed normal sinus rhythm with incomplete right bundle branch block.  Recent Labs: 08/15/2019: BUN 15; Creatinine, Ser 1.03; Hemoglobin 17.0; Platelets 148; Potassium 4.5; Sodium 139    Lipid Panel    Component Value Date/Time   CHOL 120 11/05/2018 0354   CHOL 208 (H) 08/12/2016 0808   TRIG 47 11/05/2018 0354   HDL 26 (L) 11/05/2018 0354   HDL 25 (L) 08/12/2016 0808   CHOLHDL 4.6 11/05/2018 0354   VLDL 9 11/05/2018 0354   LDLCALC 85 11/05/2018 0354   LDLCALC 159 (H) 08/12/2016 0808      Wt Readings from Last  3 Encounters:  12/22/19 251 lb (113.9 kg)  08/15/19 240 lb (108.9 kg)  12/07/18 224 lb (101.6 kg)      No flowsheet data found.    ASSESSMENT AND PLAN:  1.  Peripheral arterial disease : Status post aortobifemoral bypass with excellent recovery and resolution of all claudication.  Repeat noninvasive vascular studies in May of next year.  2. Coronary artery disease involving native coronary arteries without angina: No anginal symptoms.  Continue medical therapy.  The plan is to keep him on clopidogrel long-term given his PAD and coronary artery disease.  3. Hyperlipidemia: Continue high-dose rosuvastatin.  I requested a follow-up lipid and liver profile.  4. Tobacco use: I again discussed with him the importance of smoking cessation.  I offered to give him Chantix but he has not made up his mind to quit smoking yet although he did cut down to 5 cigarettes a day.   Disposition: Follow-up with me in 12 months.  Signed,  June, MD  12/22/2019 4:33 PM  [facility]

## 2019-12-22 NOTE — Patient Instructions (Signed)
Medication Instructions:  Your physician recommends that you continue on your current medications as directed. Please refer to the Current Medication list given to you today.  *If you need a refill on your cardiac medications before your next appointment, please call your pharmacy*   Lab Work: Your physician recommends that you return for a FASTING lipid profile and Hepatic   Please have your labs drawn at the medical mall. You do not need an appointment. Lab hours are are Mon-Fri 7am-6pm.  If you have labs (blood work) drawn today and your tests are completely normal, you will receive your results only by:  MyChart Message (if you have MyChart) OR  A paper copy in the mail If you have any lab test that is abnormal or we need to change your treatment, we will call you to review the results.   Testing/Procedures: LE dopplers in May 2022   Follow-Up: At [facility], you and your health needs are our priority.  As part of our continuing mission to provide you with exceptional heart care, we have created designated Provider Care Teams.  These Care Teams include your primary Cardiologist (physician) and Advanced Practice Providers (APPs -  Physician Assistants and Nurse Practitioners) who all work together to provide you with the care you need, when you need it.  We recommend signing up for the patient portal called "MyChart".  Sign up information is provided on this After Visit Summary.  MyChart is used to connect with patients for Virtual Visits (Telemedicine).  Patients are able to view lab/test results, encounter notes, upcoming appointments, etc.  Non-urgent messages can be sent to your provider as well.   To learn more about what you can do with MyChart, go to [email or web address].    Your next appointment:   12 month(s)  The format for your next appointment:   In Person  Provider:    You may see Lorine Bears, MD or one of the following Advanced Practice Providers on  your designated Care Team:    Nicolasa Ducking, NP  Eula Listen, PA-C  Marisue Ivan, PA-C    Other Instructions N/A

## 2020-01-11 ENCOUNTER — Other Ambulatory Visit: Payer: Self-pay | Admitting: Cardiovascular Disease

## 2020-04-12 IMAGING — CR DG SHOULDER 2+V*L*
3 series · 3 of 3 positions shown · non-contrast
Comparison: None.

CLINICAL DATA: Chronic bilateral shoulder pain.

EXAM:
LEFT SHOULDER - 2+ VIEW

[w shoulder grashey left]
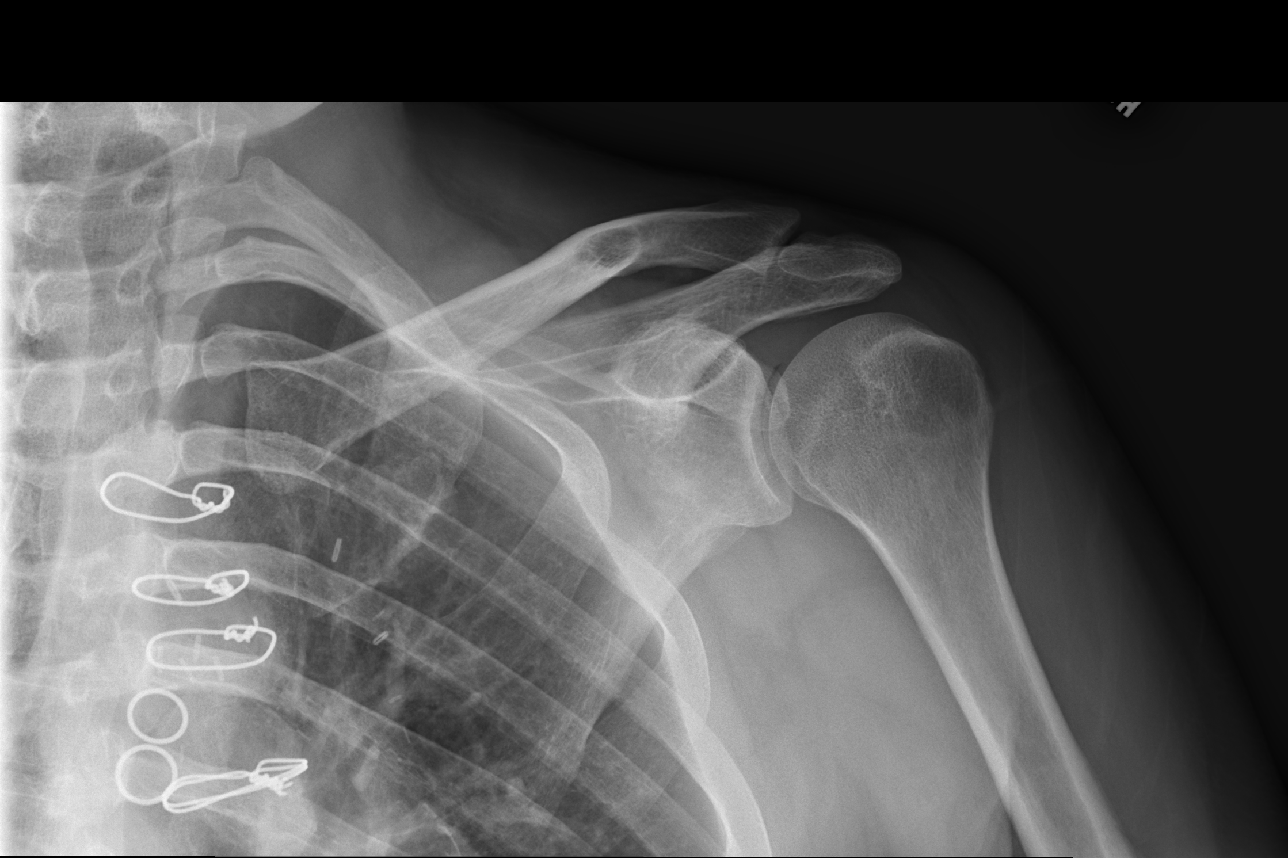

[w shoulder y-view left]
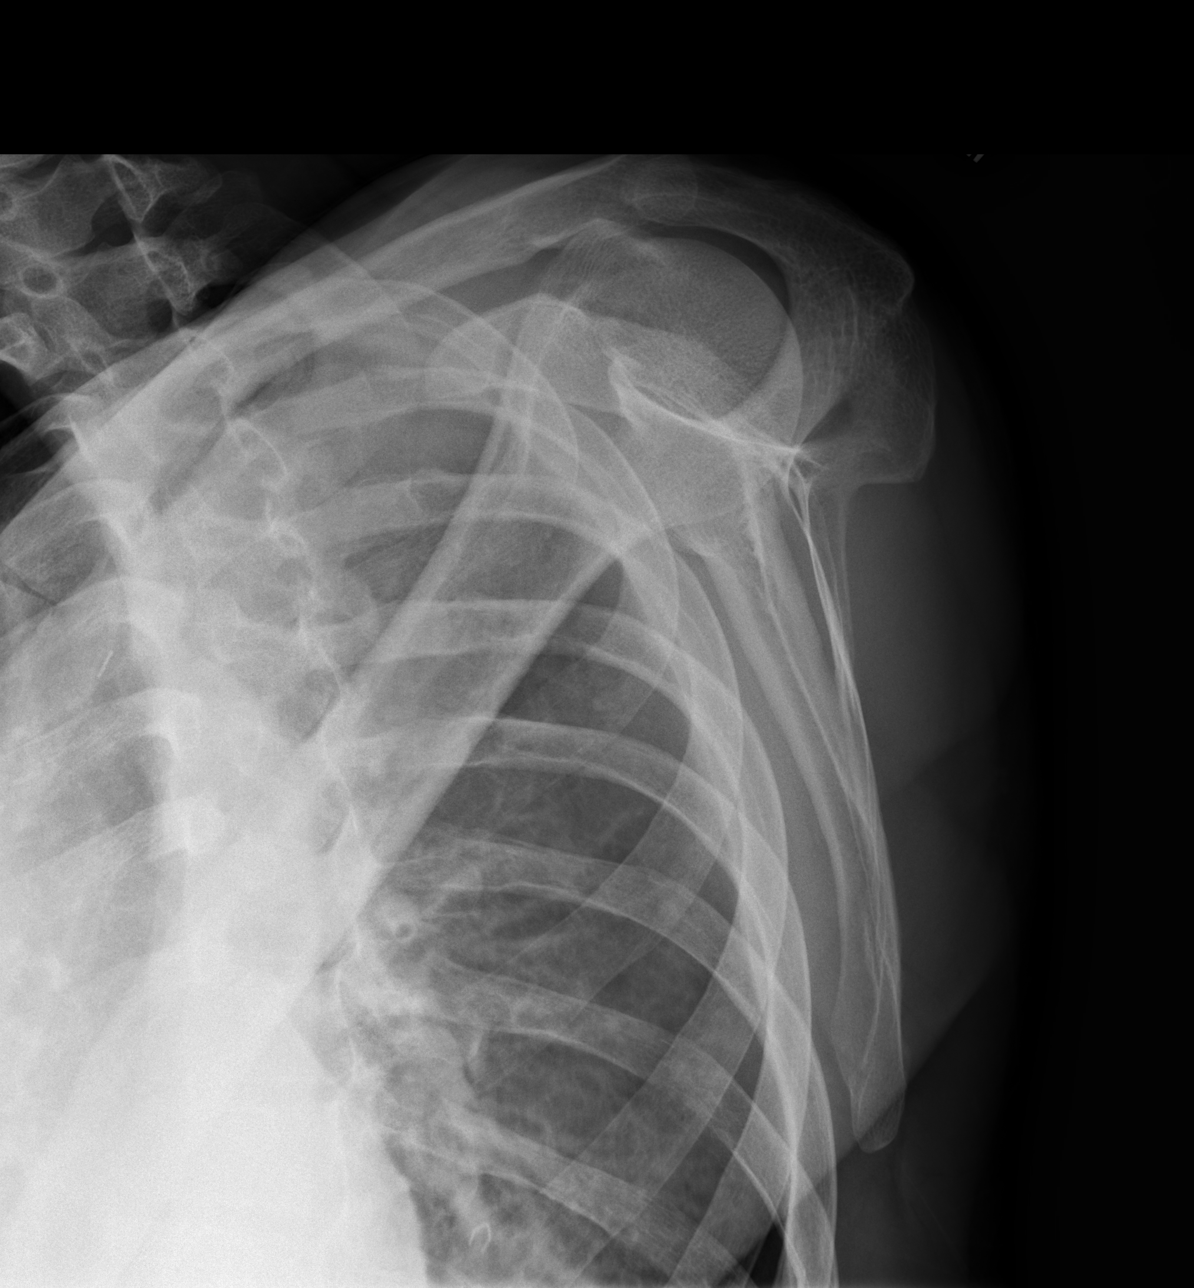

[w shoulder axillary left]
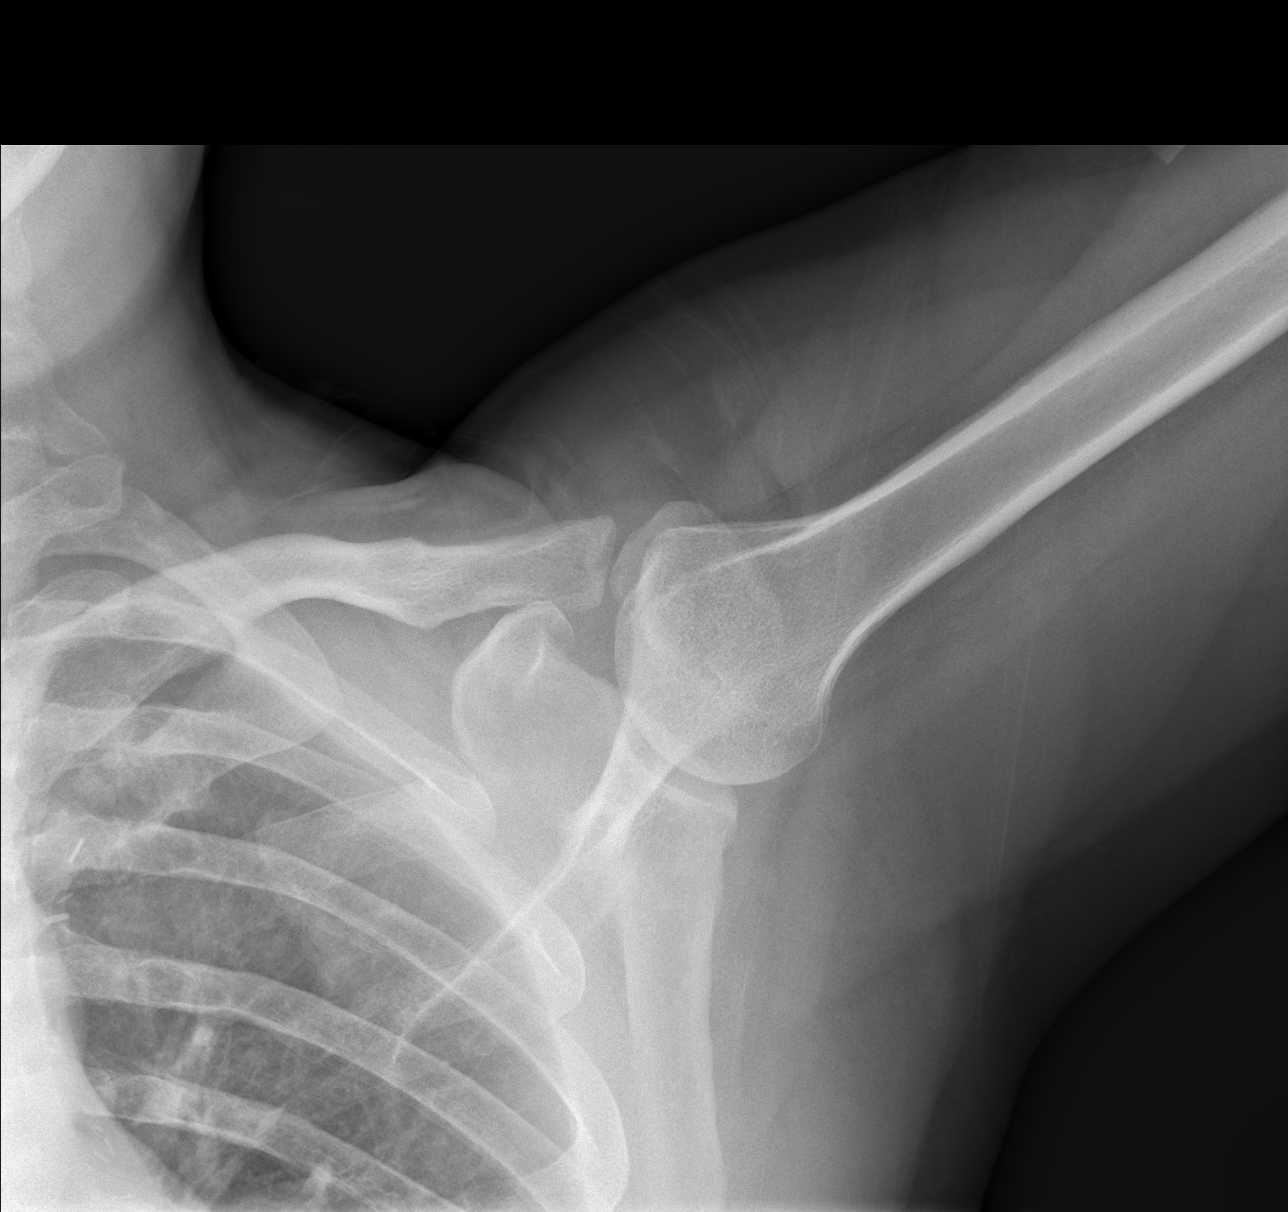

[3 of 3 positions shown; findings below may reference images not displayed]

FINDINGS: There is no evidence of fracture or dislocation. There is no
evidence of arthropathy or other focal bone abnormality. Soft
tissues are unremarkable.
IMPRESSION: Negative.

## 2020-04-12 IMAGING — CR DG CHEST 2V
2 series · 2 of 2 positions shown · non-contrast
Comparison: Radiograph November 20, 2011.  CT scan August 22, 2013.

CLINICAL DATA: Chest pain.

EXAM:
CHEST - 2 VIEW

[w chest pa]
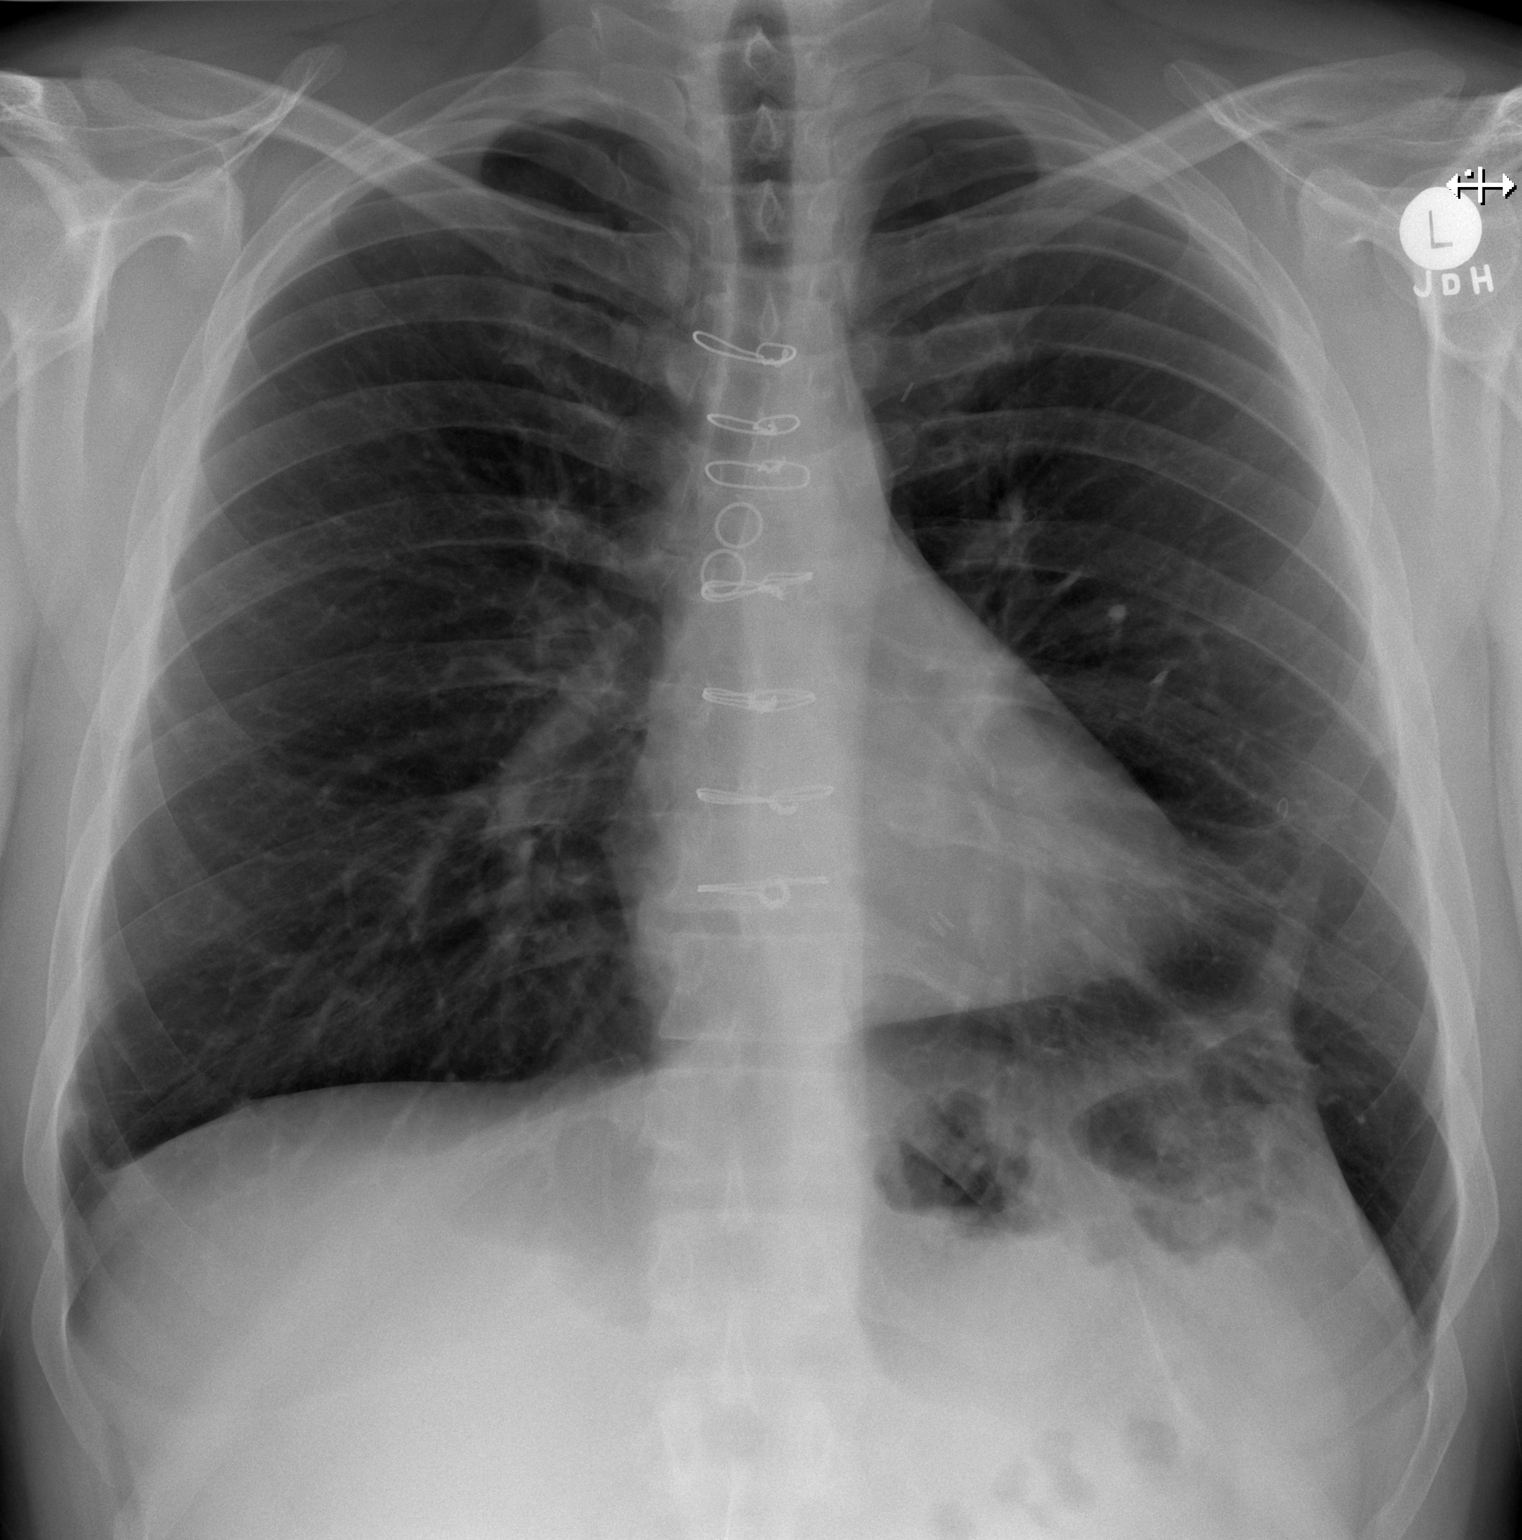

[w chest lat]
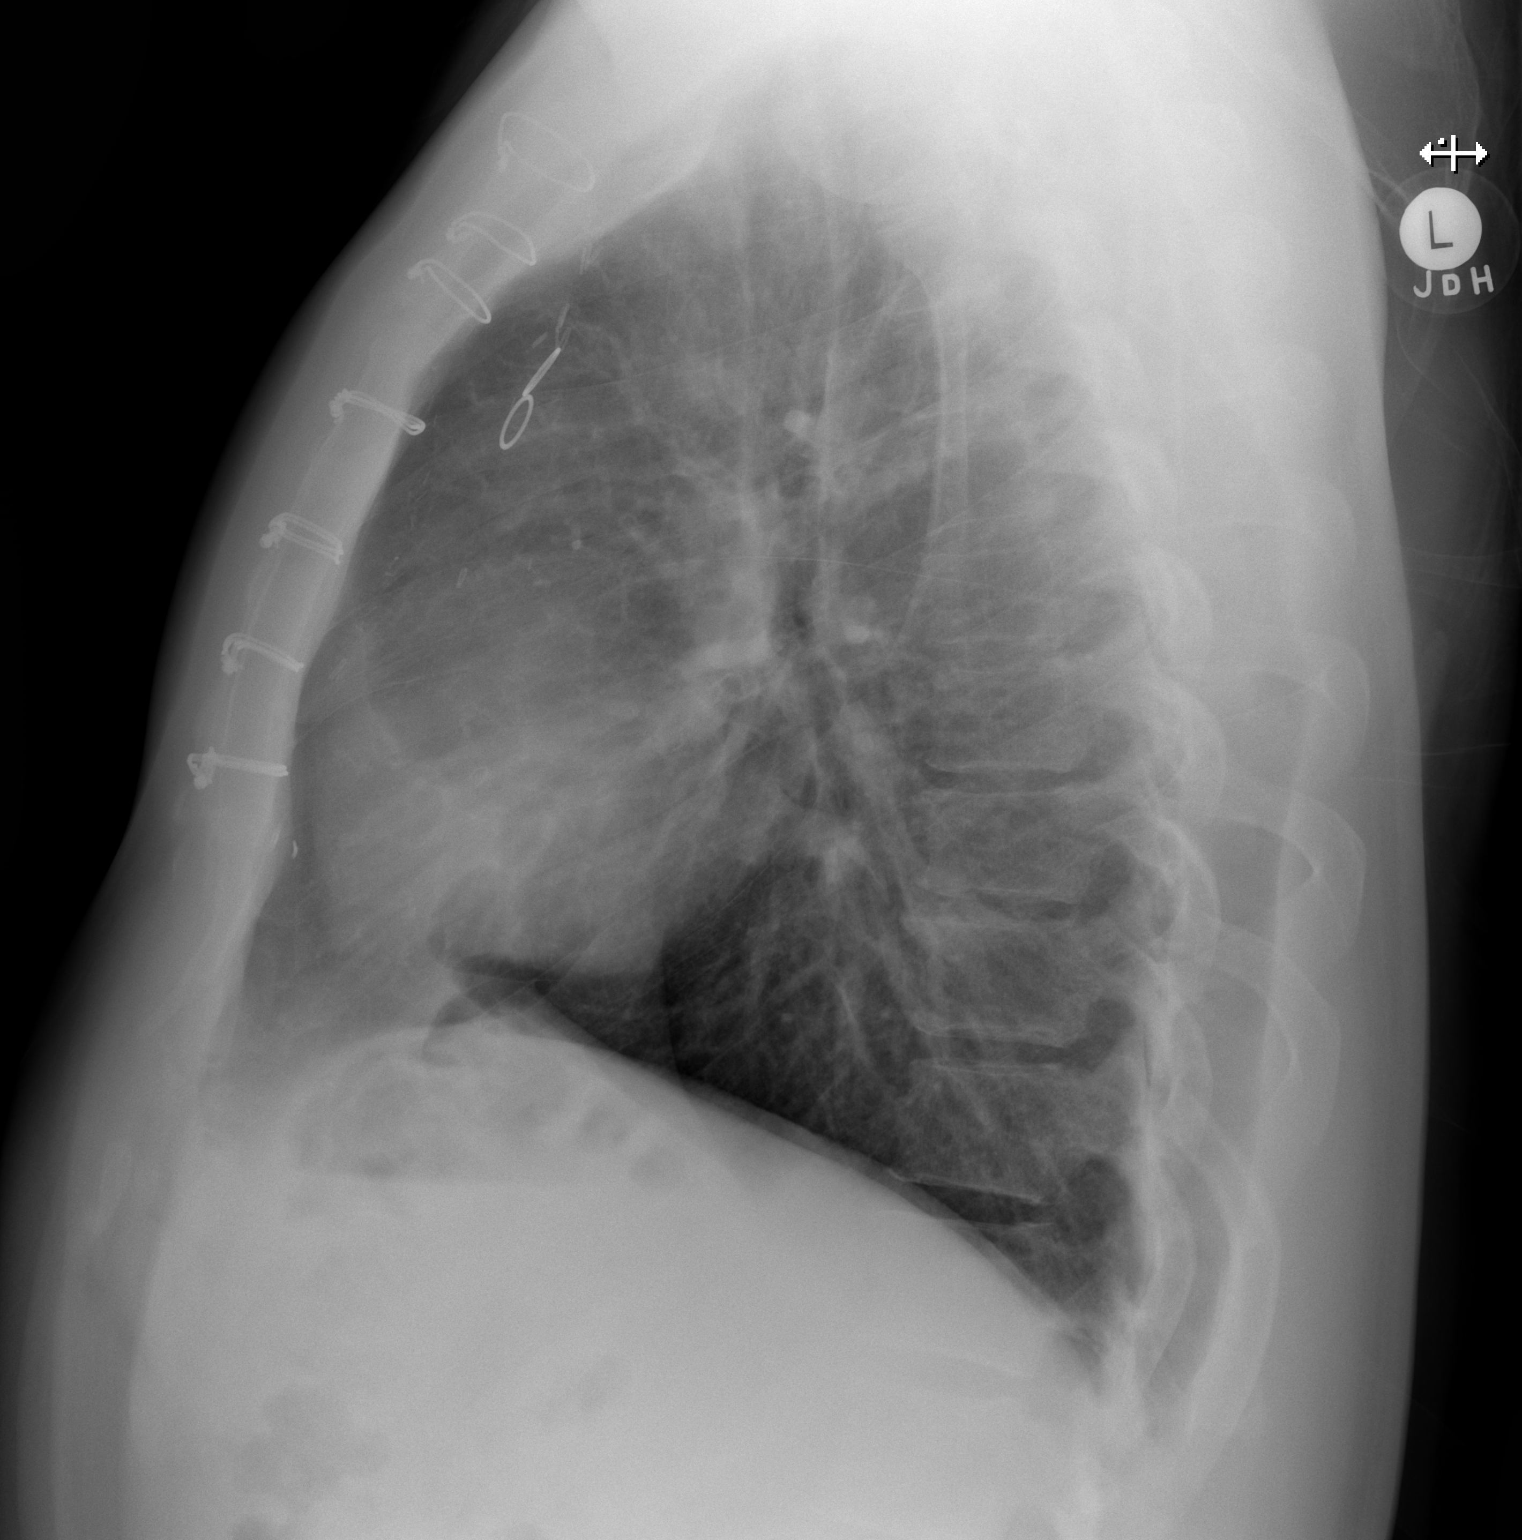

[2 of 2 positions shown; findings below may reference images not displayed]

FINDINGS: The heart size and mediastinal contours are within normal limits.
Status post coronary bypass graft. No pneumothorax or pleural
effusion is noted. Stable left basilar scarring is noted. No acute
pulmonary disease is noted. The visualized skeletal structures are
unremarkable.
IMPRESSION: No active cardiopulmonary disease.

## 2020-06-24 ENCOUNTER — Other Ambulatory Visit: Payer: Self-pay | Admitting: Cardiovascular Disease

## 2020-08-30 ENCOUNTER — Other Ambulatory Visit: Payer: Self-pay | Admitting: Cardiovascular Disease

## 2020-08-30 DIAGNOSIS — I739 Peripheral vascular disease, unspecified: Secondary | ICD-10-CM

## 2020-09-06 ENCOUNTER — Ambulatory Visit (INDEPENDENT_AMBULATORY_CARE_PROVIDER_SITE_OTHER): Payer: 59

## 2020-09-06 ENCOUNTER — Other Ambulatory Visit: Payer: Self-pay

## 2020-09-06 DIAGNOSIS — Z95820 Peripheral vascular angioplasty status with implants and grafts: Secondary | ICD-10-CM | POA: Diagnosis not present

## 2020-09-06 DIAGNOSIS — I739 Peripheral vascular disease, unspecified: Secondary | ICD-10-CM

## 2020-09-13 ENCOUNTER — Other Ambulatory Visit: Payer: Self-pay

## 2020-09-13 DIAGNOSIS — I739 Peripheral vascular disease, unspecified: Secondary | ICD-10-CM

## 2020-12-13 ENCOUNTER — Other Ambulatory Visit: Payer: Self-pay | Admitting: Cardiovascular Disease

## 2021-03-08 ENCOUNTER — Other Ambulatory Visit: Payer: Self-pay | Admitting: Cardiovascular Disease

## 2021-03-11 NOTE — Telephone Encounter (Signed)
Please contact pt for future appointment. Pt overdue for 12 month f/u. 

## 2021-03-12 NOTE — Telephone Encounter (Signed)
Scheduled in January

## 2021-04-21 ENCOUNTER — Other Ambulatory Visit: Payer: Self-pay | Admitting: Cardiovascular Disease

## 2021-05-14 ENCOUNTER — Ambulatory Visit: Payer: 59 | Admitting: Cardiovascular Disease

## 2021-05-19 ENCOUNTER — Other Ambulatory Visit: Payer: Self-pay | Admitting: Cardiovascular Disease

## 2021-07-27 ENCOUNTER — Other Ambulatory Visit: Payer: Self-pay | Admitting: Cardiovascular Disease

## 2021-08-06 ENCOUNTER — Other Ambulatory Visit: Payer: Self-pay | Admitting: Cardiovascular Disease

## 2021-08-06 DIAGNOSIS — I739 Peripheral vascular disease, unspecified: Secondary | ICD-10-CM

## 2021-08-14 ENCOUNTER — Ambulatory Visit: Payer: 59 | Admitting: Cardiovascular Disease

## 2021-08-14 NOTE — Progress Notes (Deleted)
Cardiology Office Note   Date:  08/14/2021   ID:  Brandon Parks, DOB Aug 21, 1980, MRN [id number]  PCP:  Patient, No Pcp Per (Inactive)  Cardiologist:   Lorine Bears, MD   No chief complaint on file.     History of Present Illness: Brandon Parks is a 41 y.o. male who presents for a follow-up visit of  peripheral arterial disease and coronary artery disease. He has known history of coronary artery disease status post CABG in July 2013 for ostial LAD plaque rupture with extensive thrombus and possible dissection , tobacco use and hyperlipidemia.  He has known history of peripheral arterial disease with multiple endovascular interventions on his iliac arteries and subsequent aortobifemoral bypass in July 2020. Most recent noninvasive vascular studies in May showed normal ABI and toe pressure bilaterally. Aortoiliac duplex showed patent aortobifemoral bypass. He has been doing very well with no recent chest pain, shortness of breath or palpitations.  No lower extremity claudication.  He cut down on tobacco use to 5 cigarettes a day but has not been able to quit completely.  Past Medical History:  Diagnosis Date   CAD (coronary artery disease)    Carotid artery occlusion    GERD (gastroesophageal reflux disease)    MI (myocardial infarction) (HCC) 2013   NSTEMI (non-ST elevated myocardial infarction) (HCC)    PAD (peripheral artery disease) (HCC)    Pneumothorax on left    Tendinitis    bilateral    Past Surgical History:  Procedure Laterality Date   ABDOMINAL AORTOGRAM N/A 06/25/2016   Procedure: Abdominal Aortogram;  Surgeon: Iran Ouch, MD;  [facility];  Service: Cardiovascular;  Laterality: N/A;   ABDOMINAL AORTOGRAM W/LOWER EXTREMITY N/A 09/01/2018   Procedure: ABDOMINAL AORTOGRAM W/LOWER EXTREMITY;  Surgeon: Iran Ouch, MD;  [facility]: [facility];  Service: Cardiovascular;  Laterality: N/A;   AORTA - BILATERAL FEMORAL  ARTERY BYPASS GRAFT Bilateral 11/08/2018   Procedure: AORTA BIFEMORAL BYPASS GRAFT USING HEMASHIELD GOLD 14X8MM VASCULAR GRAFT;  Surgeon: Larina Earthly, MD;  Location: [facility];  Service: Vascular;  Laterality: Bilateral;   APPENDECTOMY     CARDIAC CATHETERIZATION  2013   @ [facility]; [place]   CARDIAC CATHETERIZATION  Aug 2013   @ [facility]   CORONARY ANGIOPLASTY  2013   @[facility]    CORONARY ARTERY BYPASS GRAFT  2013   @ [facility]   LOWER EXTREMITY ANGIOGRAPHY N/A 06/25/2016   Procedure: Lower Extremity Angiography;  Surgeon: 08/25/2016, MD;  [facility];  Service: Cardiovascular;  Laterality: N/A;  Limited study   PERIPHERAL VASCULAR CATHETERIZATION N/A 02/28/2015   Procedure: Abdominal Aortogram;  Surgeon: 13/12/2014, MD;  [facility];  Service: Cardiovascular;  Laterality: N/A;   PERIPHERAL VASCULAR INTERVENTION Bilateral 06/25/2016   Procedure: Peripheral Vascular Intervention;  Surgeon: 08/25/2016, MD;  [facility];  Service: Cardiovascular;  Laterality: Bilateral;  Bilateral Kissing Iliac stents   PERIPHERAL VASCULAR INTERVENTION Bilateral 09/01/2018   Procedure: PERIPHERAL VASCULAR INTERVENTION;  Surgeon: 09/03/2018, MD;  [facility];  Service: Cardiovascular;  Laterality: Bilateral;  bilateral common iliacs     Current Outpatient Medications  Medication Sig Dispense Refill   clopidogrel (PLAVIX) 75 MG tablet TAKE 1 TABLET BY MOUTH DAILY 30 tablet 2   nitroGLYCERIN (NITROSTAT) 0.4 MG SL tablet Place 1 tablet (0.4 mg total) under the tongue every 5 (five) minutes as needed.  25 tablet 6   rosuvastatin (CRESTOR) 40 MG tablet TAKE 1 TABLET BY MOUTH DAILY 30 tablet 2   No current facility-administered medications for this visit.    Allergies:   Patient has no known allergies.    Social History:  The patient  reports that he has been smoking cigarettes. He has a 1.75 pack-year smoking history. He has never used  smokeless tobacco. He reports current alcohol use of about 2.0 standard drinks per week. He reports that he does not currently use drugs after having used the following drugs: Marijuana.   Family History:  The patient's Family history is unknown by patient.    ROS:  Please see the history of present illness.   Otherwise, review of systems are positive for none.   All other systems are reviewed and negative.    PHYSICAL EXAM: VS:  There were no vitals taken for this visit. , BMI There is no height or weight on file to calculate BMI. GEN: Well nourished, well developed, in no acute distress  HEENT: normal  Neck: no JVD, carotid bruits, or masses Cardiac: RRR; no murmurs, rubs, or gallops,no edema  Respiratory:  clear to auscultation bilaterally, normal work of breathing GI: soft, nontender, nondistended, + BS MS: no deformity or atrophy  Skin: warm and dry, no rash Neuro:  Strength and sensation are intact Psych: euthymic mood, full affect Vascular: Posterior tibial is palpable and normal bilaterally.  Dorsalis pedis is mildly diminished.  EKG:  EKG is  ordered today. EKG showed normal sinus rhythm with incomplete right bundle branch block.  Recent Labs: No results found for requested labs within last 8760 hours.    Lipid Panel    Component Value Date/Time   CHOL 120 11/05/2018 0354   CHOL 208 (H) 08/12/2016 0808   TRIG 47 11/05/2018 0354   HDL 26 (L) 11/05/2018 0354   HDL 25 (L) 08/12/2016 0808   CHOLHDL 4.6 11/05/2018 0354   VLDL 9 11/05/2018 0354   LDLCALC 85 11/05/2018 0354   LDLCALC 159 (H) 08/12/2016 0808      Wt Readings from Last 3 Encounters:  12/22/19 251 lb (113.9 kg)  08/15/19 240 lb (108.9 kg)  12/07/18 224 lb (101.6 kg)          View : No data to display.            ASSESSMENT AND PLAN:  1.  Peripheral arterial disease : Status post aortobifemoral bypass with excellent recovery and resolution of all claudication.  Repeat noninvasive vascular  studies in May of next year.  2. Coronary artery disease involving native coronary arteries without angina: No anginal symptoms.  Continue medical therapy.  The plan is to keep him on clopidogrel long-term given his PAD and coronary artery disease.  3. Hyperlipidemia: Continue high-dose rosuvastatin.  I requested a follow-up lipid and liver profile.  4. Tobacco use: I again discussed with him the importance of smoking cessation.  I offered to give him Chantix but he has not made up his mind to quit smoking yet although he did cut down to 5 cigarettes a day.   Disposition: Follow-up with me in 12 months.  Signed,  Lorine Bears, MD  08/14/2021 2:24 PM    [facility]

## 2021-08-15 ENCOUNTER — Encounter: Payer: Self-pay | Admitting: Cardiovascular Disease

## 2021-08-21 ENCOUNTER — Ambulatory Visit (INDEPENDENT_AMBULATORY_CARE_PROVIDER_SITE_OTHER): Payer: 59

## 2021-08-21 DIAGNOSIS — Z95820 Peripheral vascular angioplasty status with implants and grafts: Secondary | ICD-10-CM | POA: Diagnosis not present

## 2021-08-21 DIAGNOSIS — I739 Peripheral vascular disease, unspecified: Secondary | ICD-10-CM | POA: Diagnosis not present

## 2021-08-22 ENCOUNTER — Telehealth: Payer: Self-pay

## 2021-08-22 DIAGNOSIS — I739 Peripheral vascular disease, unspecified: Secondary | ICD-10-CM

## 2021-08-22 NOTE — Telephone Encounter (Signed)
Patient made aware of PV test results with verbalized understanding. Order placed for 1 yr repeat testing. ?

## 2021-09-25 DIAGNOSIS — Z006 Encounter for examination for normal comparison and control in clinical research program: Secondary | ICD-10-CM

## 2021-09-25 NOTE — Research (Signed)
Tried to contact patient to discuss the Ocean(a) clinical trial. No answer and unable to leave a message due to mail box being full. No email on file. Will try again at a later date.

## 2021-10-14 ENCOUNTER — Other Ambulatory Visit: Payer: Self-pay | Admitting: Cardiovascular Disease

## 2021-10-14 DIAGNOSIS — I739 Peripheral vascular disease, unspecified: Secondary | ICD-10-CM

## 2021-11-15 DIAGNOSIS — Z006 Encounter for examination for normal comparison and control in clinical research program: Secondary | ICD-10-CM

## 2021-11-15 NOTE — Research (Signed)
Called patient to discuss the Ocean(a) trial. Voice message left asking for a return call.

## 2022-09-08 ENCOUNTER — Other Ambulatory Visit: Payer: Self-pay | Admitting: Cardiovascular Disease

## 2022-09-08 DIAGNOSIS — I739 Peripheral vascular disease, unspecified: Secondary | ICD-10-CM

## 2022-09-24 ENCOUNTER — Ambulatory Visit: Payer: 59

## 2022-09-24 ENCOUNTER — Ambulatory Visit (INDEPENDENT_AMBULATORY_CARE_PROVIDER_SITE_OTHER): Payer: 59

## 2022-09-24 DIAGNOSIS — I739 Peripheral vascular disease, unspecified: Secondary | ICD-10-CM | POA: Diagnosis not present

## 2022-09-24 DIAGNOSIS — Z95820 Peripheral vascular angioplasty status with implants and grafts: Secondary | ICD-10-CM | POA: Diagnosis not present

## 2022-09-24 LAB — VAS US ABI WITH/WO TBI
Left ABI: 0.85
Right ABI: 1.04

## 2022-10-30 ENCOUNTER — Encounter: Payer: Self-pay | Admitting: Cardiovascular Disease

## 2022-10-30 ENCOUNTER — Other Ambulatory Visit
Admission: RE | Admit: 2022-10-30 | Discharge: 2022-10-30 | Disposition: A | Discharge status: Home / Self Care | Payer: 59 | Source: Ambulatory Visit | Attending: Cardiovascular Disease | Admitting: Cardiovascular Disease

## 2022-10-30 ENCOUNTER — Ambulatory Visit: Payer: 59 | Attending: Cardiovascular Disease | Admitting: Cardiovascular Disease

## 2022-10-30 VITALS — BP 100/70 | HR 68 | Ht 74.0 in | Wt 237.1 lb

## 2022-10-30 DIAGNOSIS — E785 Hyperlipidemia, unspecified: Secondary | ICD-10-CM | POA: Insufficient documentation

## 2022-10-30 DIAGNOSIS — I2510 Atherosclerotic heart disease of native coronary artery without angina pectoris: Secondary | ICD-10-CM

## 2022-10-30 DIAGNOSIS — I739 Peripheral vascular disease, unspecified: Secondary | ICD-10-CM

## 2022-10-30 DIAGNOSIS — I10 Essential (primary) hypertension: Secondary | ICD-10-CM | POA: Diagnosis not present

## 2022-10-30 DIAGNOSIS — Z720 Tobacco use: Secondary | ICD-10-CM

## 2022-10-30 DIAGNOSIS — Z951 Presence of aortocoronary bypass graft: Secondary | ICD-10-CM

## 2022-10-30 LAB — CBC
HCT: 46.9 % (ref 39.0–52.0)
Hemoglobin: 15.8 g/dL (ref 13.0–17.0)
MCH: 31 pg (ref 26.0–34.0)
MCHC: 33.7 g/dL (ref 30.0–36.0)
MCV: 92.1 fL (ref 80.0–100.0)
Platelets: 136 10*3/uL — ABNORMAL LOW (ref 150–400)
RBC: 5.09 MIL/uL (ref 4.22–5.81)
RDW: 11.7 % (ref 11.5–15.5)
WBC: 9.7 10*3/uL (ref 4.0–10.5)
nRBC: 0 % (ref 0.0–0.2)

## 2022-10-30 LAB — COMPREHENSIVE METABOLIC PANEL
ALT: 24 U/L (ref 0–44)
AST: 19 U/L (ref 15–41)
Albumin: 4.1 g/dL (ref 3.5–5.0)
Alkaline Phosphatase: 59 U/L (ref 38–126)
Anion gap: 9 (ref 5–15)
BUN: 20 mg/dL (ref 6–20)
CO2: 22 mmol/L (ref 22–32)
Calcium: 9.3 mg/dL (ref 8.9–10.3)
Chloride: 106 mmol/L (ref 98–111)
Creatinine, Ser: 1.03 mg/dL (ref 0.61–1.24)
GFR, Estimated: >60 mL/min (ref >60)
Glucose, Bld: 89 mg/dL (ref 70–99)
Potassium: 4.2 mmol/L (ref 3.5–5.1)
Sodium: 137 mmol/L (ref 135–145)
Total Bilirubin: 1.1 mg/dL (ref 0.3–1.2)
Total Protein: 7.2 g/dL (ref 6.5–8.1)

## 2022-10-30 LAB — LIPID PANEL
Cholesterol: 183 mg/dL (ref 0–200)
HDL: 24 mg/dL — ABNORMAL LOW (ref >40)
LDL Cholesterol: 135 mg/dL — ABNORMAL HIGH (ref 0–99)
Total CHOL/HDL Ratio: 7.6 RATIO
Triglycerides: 120 mg/dL (ref <150)
VLDL: 24 mg/dL (ref 0–40)

## 2022-10-30 MED ORDER — ASPIRIN 81 MG PO TBEC: 81.0000 mg | DELAYED_RELEASE_TABLET | Freq: Every day | ORAL | Status: AC

## 2022-10-30 MED ORDER — ROSUVASTATIN CALCIUM 40 MG PO TABS: 40.0000 mg | ORAL_TABLET | Freq: Every day | ORAL | 3 refills | Status: DC

## 2022-10-30 NOTE — Progress Notes (Signed)
Cardiology Office Note   Date:  10/30/2022   ID:  Brandon Parks, DOB [birth year], MRN [id number]  PCP:  Patient, No Pcp Per  Cardiologist:   Lorine Bears, MD   Chief Complaint  Patient presents with   Follow-up    F/u ABN Doppler c/o feeling like his "left leg has slowed down a bit." Meds reviewed verbally with pt.      History of Present Illness: Brandon Parks is a 42 y.o. male who presents for a follow-up visit of  peripheral arterial disease and coronary artery disease. He has known history of coronary artery disease status post CABG in July 2013 for ostial LAD plaque rupture with extensive thrombus and possible dissection , tobacco use and hyperlipidemia.  He has known history of peripheral arterial disease with multiple endovascular interventions on his iliac arteries and subsequent aortobifemoral bypass in July 2020.  He has not been seen in more than 2 years and stopped taking all his medications.  He continues to smoke half a pack per day.  He had recent noninvasive Doppler studies which showed mildly decreased ABI on the left side with moderately elevated velocity at the left distal anastomosis of the aortobifemoral bypass.  He does complain of mild left leg claudication but symptoms are currently not lifestyle limiting.  No chest pain or shortness of breath.  He is currently not working but takes care of of his 2 boys.  Past Medical History:  Diagnosis Date   CAD (coronary artery disease)    Carotid artery occlusion    GERD (gastroesophageal reflux disease)    MI (myocardial infarction) (HCC) 2013   NSTEMI (non-ST elevated myocardial infarction) (HCC)    PAD (peripheral artery disease) (HCC)    Pneumothorax on left    Tendinitis    bilateral    Past Surgical History:  Procedure Laterality Date   ABDOMINAL AORTOGRAM N/A 06/25/2016   Procedure: Abdominal Aortogram;  Surgeon: Iran Ouch, MD;  [facility];  Service:  Cardiovascular;  Laterality: N/A;   ABDOMINAL AORTOGRAM W/LOWER EXTREMITY N/A 09/01/2018   Procedure: ABDOMINAL AORTOGRAM W/LOWER EXTREMITY;  Surgeon: Iran Ouch, MD;  [facility];  Service: Cardiovascular;  Laterality: N/A;   AORTA - BILATERAL FEMORAL ARTERY BYPASS GRAFT Bilateral 11/08/2018   Procedure: AORTA BIFEMORAL BYPASS GRAFT USING HEMASHIELD GOLD 14X8MM VASCULAR GRAFT;  Surgeon: Larina Earthly, MD;  [facility];  Service: Vascular;  Laterality: Bilateral;   APPENDECTOMY     CARDIAC CATHETERIZATION  2013   @ [facility]; [place]   CARDIAC CATHETERIZATION  Aug 2013   @ [facility]   CORONARY ANGIOPLASTY  2013   @[facility]    CORONARY ARTERY BYPASS GRAFT  2013   @ [facility]   LOWER EXTREMITY ANGIOGRAPHY N/A 06/25/2016   Procedure: Lower Extremity Angiography;  Surgeon: Iran Ouch, MD;  [facility];  Service: Cardiovascular;  Laterality: N/A;  Limited study   PERIPHERAL VASCULAR CATHETERIZATION N/A 02/28/2015   Procedure: Abdominal Aortogram;  Surgeon: Iran Ouch, MD;  [facility];  Service: Cardiovascular;  Laterality: N/A;   PERIPHERAL VASCULAR INTERVENTION Bilateral 06/25/2016   Procedure: Peripheral Vascular Intervention;  Surgeon: Iran Ouch, MD;  [facility];  Service: Cardiovascular;  Laterality: Bilateral;  Bilateral Kissing Iliac stents   PERIPHERAL VASCULAR INTERVENTION Bilateral 09/01/2018   Procedure: PERIPHERAL VASCULAR INTERVENTION;  Surgeon: Iran Ouch, MD;  [facility];  Service:  Cardiovascular;  Laterality: Bilateral;  bilateral common iliacs     Current Outpatient Medications  Medication Sig Dispense Refill   aspirin EC 81 MG tablet Take 1 tablet (81 mg total) by mouth daily. Swallow whole.     rosuvastatin (CRESTOR) 40 MG tablet Take 1 tablet (40 mg total) by mouth daily. 90 tablet 3   No current facility-administered medications for this visit.    Allergies:   Patient has  no known allergies.    Social History:  The patient  reports that he has been smoking cigarettes. He has a 1.8 pack-year smoking history. He has never used smokeless tobacco. He reports current alcohol use of about 2.0 standard drinks of alcohol per week. He reports that he does not currently use drugs after having used the following drugs: Marijuana.   Family History:  The patient's Family history is unknown by patient.    ROS:  Please see the history of present illness.   Otherwise, review of systems are positive for none.   All other systems are reviewed and negative.    PHYSICAL EXAM: VS:  BP 100/70 (BP Location: Left Arm, Patient Position: Sitting, Cuff Size: Normal)   Pulse 68   Ht 6\' 2"  (1.88 m)   Wt 237 lb 2 oz (107.6 kg)   SpO2 97%   BMI 30.45 kg/m  , BMI Body mass index is 30.45 kg/m. GEN: Well nourished, well developed, in no acute distress  HEENT: normal  Neck: no JVD, carotid bruits, or masses Cardiac: RRR; no murmurs, rubs, or gallops,no edema  Respiratory:  clear to auscultation bilaterally, normal work of breathing GI: soft, nontender, nondistended, + BS MS: no deformity or atrophy  Skin: warm and dry, no rash Neuro:  Strength and sensation are intact Psych: euthymic mood, full affect    EKG:  EKG is  ordered today. EKG showed: Normal sinus rhythm Rightward axis When compared with ECG of 22-Dec-2019 16:27, Incomplete right bundle branch block is no longer Present   Recent Labs: 10/30/2022: ALT 24; BUN 20; Creatinine, Ser 1.03; Hemoglobin 15.8; Platelets 136; Potassium 4.2; Sodium 137    Lipid Panel    Component Value Date/Time   CHOL 183 10/30/2022 1613   CHOL 208 (H) 08/12/2016 0808   TRIG 120 10/30/2022 1613   HDL 24 (L) 10/30/2022 1613   HDL 25 (L) 08/12/2016 0808   CHOLHDL 7.6 10/30/2022 1613   VLDL 24 10/30/2022 1613   LDLCALC 135 (H) 10/30/2022 1613   LDLCALC 159 (H) 08/12/2016 0808      Wt Readings from Last 3 Encounters:  10/30/22 237  lb 2 oz (107.6 kg)  12/22/19 251 lb (113.9 kg)  08/15/19 240 lb (108.9 kg)          No data to display            ASSESSMENT AND PLAN:  1.  Peripheral arterial disease : Status post aortobifemoral bypass.  There is borderline elevated velocities at the left distal anastomosis of the aortobifemoral bypass.  This is associated with mildly reduced ABI and he does have claudication on that side.  However, his symptoms are currently not lifestyle limiting.  Will continue to monitor. I asked him to start taking aspirin 81 mg once daily.  2. Coronary artery disease involving native coronary arteries without angina: No anginal symptoms.  He stopped taking clopidogrel and we will start him on aspirin 81 mg daily as outlined above.  3. Hyperlipidemia: Unfortunately, he stopped taking rosuvastatin.  I  discussed with him the importance of medications.  I resumed rosuvastatin 20 mg daily.  Will check routine labs today.  4. Tobacco use: I again discussed with him the importance of smoking cessation.     Disposition: Follow-up with me in 6 months.  Signed,  Lorine Bears, MD  10/30/2022 5:02 PM    [facility]

## 2022-10-30 NOTE — Patient Instructions (Signed)
Medication Instructions:  START the Aspirin 81 mg once daily  RESTART the Rosuvastatin 40 mg once daily *If you need a refill on your cardiac medications before your next appointment, please call your pharmacy*   Lab Work: Your provider would like for you to have following labs drawn: CBC, CMET, Lipid.   Please go to the [facility] entrance and check in at the front desk.  You do not need an appointment.  They are open from 7am-6 pm.   If you have labs (blood work) drawn today and your tests are completely normal, you will receive your results only by: MyChart Message (if you have MyChart) OR A paper copy in the mail If you have any lab test that is abnormal or we need to change your treatment, we will call you to review the results.   Testing/Procedures: None ordered   Follow-Up: At [facility], you and your health needs are our priority.  As part of our continuing mission to provide you with exceptional heart care, we have created designated Provider Care Teams.  These Care Teams include your primary Cardiologist (physician) and Advanced Practice Providers (APPs -  Physician Assistants and Nurse Practitioners) who all work together to provide you with the care you need, when you need it.  We recommend signing up for the patient portal called "MyChart".  Sign up information is provided on this After Visit Summary.  MyChart is used to connect with patients for Virtual Visits (Telemedicine).  Patients are able to view lab/test results, encounter notes, upcoming appointments, etc.  Non-urgent messages can be sent to your provider as well.   To learn more about what you can do with MyChart, go to [email or web address].    Your next appointment:   6 month(s)  Provider:   You may see Lorine Bears, MD or one of the following Advanced Practice Providers on your designated Care Team:   Nicolasa Ducking, NP Eula Listen, PA-C Cadence Fransico Michael, PA-C Charlsie Quest, NP     Other Instructions Managing the Challenge of Quitting Smoking Quitting smoking is a physical and mental challenge. You may have cravings, withdrawal symptoms, and temptation to smoke. Before quitting, work with your health care provider to make a plan that can help you manage quitting. Making a plan before you quit may keep you from smoking when you have the urge to smoke while trying to quit. How to manage lifestyle changes Managing stress Stress can make you want to smoke, and wanting to smoke may cause stress. It is important to find ways to manage your stress. You could try some of the following: Practice relaxation techniques. Breathe slowly and deeply, in through your nose and out through your mouth. Listen to music. Soak in a bath or take a shower. Imagine a peaceful place or vacation. Get some support. Talk with family or friends about your stress. Join a support group. Talk with a counselor or therapist. Get some physical activity. Go for a walk, run, or bike ride. Play a favorite sport. Practice yoga.  Medicines Talk with your health care provider about medicines that might help you deal with cravings and make quitting easier for you. Relationships Social situations can be difficult when you are quitting smoking. To manage this, you can: Avoid parties and other social situations where people might be smoking. Avoid alcohol. Leave right away if you have the urge to smoke. Explain to your family and friends that you are quitting smoking. Ask for support and let  them know you might be a bit grumpy. Plan activities where smoking is not an option. General instructions Be aware that many people gain weight after they quit smoking. However, not everyone does. To keep from gaining weight, have a plan in place before you quit, and stick to the plan after you quit. Your plan should include: Eating healthy snacks. When you have a craving, it may help to: Eat popcorn, or try  carrots, celery, or other cut vegetables. Chew sugar-free gum. Changing how you eat. Eat small portion sizes at meals. Eat 4-6 small meals throughout the day instead of 1-2 large meals a day. Be mindful when you eat. You should avoid watching television or doing other things that might distract you as you eat. Exercising regularly. Make time to exercise each day. If you do not have time for a long workout, do short bouts of exercise for 5-10 minutes several times a day. Do some form of strengthening exercise, such as weight lifting. Do some exercise that gets your heart beating and causes you to breathe deeply, such as walking fast, running, swimming, or biking. This is very important. Drinking plenty of water or other low-calorie or no-calorie drinks. Drink enough fluid to keep your urine pale yellow.  How to recognize withdrawal symptoms Your body and mind may experience discomfort as you try to get used to not having nicotine in your system. These effects are called withdrawal symptoms. They may include: Feeling hungrier than normal. Having trouble concentrating. Feeling irritable or restless. Having trouble sleeping. Feeling depressed. Craving a cigarette. These symptoms may surprise you, but they are normal to have when quitting smoking. To manage withdrawal symptoms: Avoid places, people, and activities that trigger your cravings. Remember why you want to quit. Get plenty of sleep. Avoid coffee and other drinks that contain caffeine. These may worsen some of your symptoms. How to manage cravings Come up with a plan for how to deal with your cravings. The plan should include the following: A definition of the specific situation you want to deal with. An activity or action you will take to replace smoking. A clear idea for how this action will help. The name of someone who could help you with this. Cravings usually last for 5-10 minutes. Consider taking the following actions to  help you with your plan to deal with cravings: Keep your mouth busy. Chew sugar-free gum. Suck on hard candies or a straw. Brush your teeth. Keep your hands and body busy. Change to a different activity right away. Squeeze or play with a ball. Do an activity or a hobby, such as making bead jewelry, practicing needlepoint, or working with wood. Mix up your normal routine. Take a short exercise break. Go for a quick walk, or run up and down stairs. Focus on doing something kind or helpful for someone else. Call a friend or family member to talk during a craving. Join a support group. Contact a quitline. Where to find support To get help or find a support group: Call the National Cancer Institute's Smoking Quitline: 1-800-QUIT-NOW 873-145-7809) Text QUIT to SmokefreeTXT: 454098 Where to find more information Visit these websites to find more information on quitting smoking: U.S. Department of Health and Human Services: www.smokefree.gov American Lung Association: www.freedomfromsmoking.org Centers for Disease Control and Prevention (CDC): FootballExhibition.com.br American Heart Association: www.heart.org Contact a health care provider if: You want to change your plan for quitting. The medicines you are taking are not helping. Your eating feels out of control or  you cannot sleep. You feel depressed or become very anxious. Summary Quitting smoking is a physical and mental challenge. You will face cravings, withdrawal symptoms, and temptation to smoke again. Preparation can help you as you go through these challenges. Try different techniques to manage stress, handle social situations, and prevent weight gain. You can deal with cravings by keeping your mouth busy (such as by chewing gum), keeping your hands and body busy, calling family or friends, or contacting a quitline for people who want to quit smoking. You can deal with withdrawal symptoms by avoiding places where people smoke, getting plenty of  rest, and avoiding drinks that contain caffeine. This information is not intended to replace advice given to you by your health care provider. Make sure you discuss any questions you have with your health care provider. Document Revised: 03/29/2021 Document Reviewed: 03/29/2021 Elsevier Patient Education  2024 [employer].

## 2023-02-09 ENCOUNTER — Emergency Department
Admission: EM | Admit: 2023-02-09 | Discharge: 2023-02-09 | Disposition: A | Discharge status: Home / Self Care | Payer: 59 | Attending: Emergency Medicine | Admitting: Emergency Medicine

## 2023-02-09 ENCOUNTER — Encounter: Payer: Self-pay | Admitting: Emergency Medicine

## 2023-02-09 ENCOUNTER — Other Ambulatory Visit: Payer: Self-pay

## 2023-02-09 ENCOUNTER — Emergency Department: Payer: 59

## 2023-02-09 DIAGNOSIS — W5501XA Bitten by cat, initial encounter: Secondary | ICD-10-CM | POA: Diagnosis not present

## 2023-02-09 DIAGNOSIS — S61451A Open bite of right hand, initial encounter: Secondary | ICD-10-CM | POA: Diagnosis present

## 2023-02-09 DIAGNOSIS — S61431A Puncture wound without foreign body of right hand, initial encounter: Secondary | ICD-10-CM | POA: Insufficient documentation

## 2023-02-09 DIAGNOSIS — I2510 Atherosclerotic heart disease of native coronary artery without angina pectoris: Secondary | ICD-10-CM | POA: Diagnosis not present

## 2023-02-09 DIAGNOSIS — Z23 Encounter for immunization: Secondary | ICD-10-CM | POA: Diagnosis not present

## 2023-02-09 MED ORDER — HYDROCODONE-ACETAMINOPHEN 5-325 MG PO TABS: 1.0000 | ORAL_TABLET | ORAL | 0 refills | Status: AC | PRN

## 2023-02-09 MED ORDER — AMOXICILLIN-POT CLAVULANATE 875-125 MG PO TABS
1.0000 | ORAL_TABLET | Freq: Once | ORAL | Status: AC
  Administered 2023-02-09: 1 via ORAL
  Filled 2023-02-09: qty 1

## 2023-02-09 MED ORDER — AMOXICILLIN-POT CLAVULANATE 875-125 MG PO TABS: 1.0000 | ORAL_TABLET | Freq: Two times a day (BID) | ORAL | 0 refills | Status: AC

## 2023-02-09 MED ORDER — TETANUS-DIPHTH-ACELL PERTUSSIS 5-2.5-18.5 LF-MCG/0.5 IM SUSY
0.5000 mL | PREFILLED_SYRINGE | Freq: Once | INTRAMUSCULAR | Status: AC
  Administered 2023-02-09: 0.5 mL via INTRAMUSCULAR
  Filled 2023-02-09: qty 0.5

## 2023-02-09 NOTE — Discharge Instructions (Signed)
As we discussed please take your antibiotics as prescribed for their entire course.  Return to the emergency department for any significant increase in swelling pain redness pus or fever.  Otherwise please take antibiotics as prescribed pain medication as needed.  Do not drive or drink alcohol while taking pain medication.

## 2023-02-09 NOTE — ED Triage Notes (Signed)
Pt present POV with cat bite to right wrist. Pt states new dog in home and the house cat got upset and bit pt in right wrist. Pt states pain is excruciating with any movement of hand or fingers. Pt states he things cat is up to date on shots. Pt states wrist and hand throbbing in pain. Puncture wound to top and side of right wrist.

## 2023-02-09 NOTE — ED Provider Notes (Signed)
[facility] Provider Note    Event Date/Time   First MD Initiated Contact with Patient 02/09/23 1604     (approximate)  History   Chief Complaint: Animal Bite  HPI  Brandon Parks is a 42 y.o. male with a past medical history of gastric reflux, CAD, presents to the emergency department for a cat bite to his right hand.  According to the patient he got a new dog, his cat and dog were getting into a fight he tried to separate them and he got bit on the right hand by his cat.  Patient states his cat has been vaccinated.  States the bite occurred around 11 PM last night noted some redness and swelling to the area today so he came to the emergency department for evaluation.  States moderate pain throughout the hand.  Physical Exam   Triage Vital Signs: ED Triage Vitals  Encounter Vitals Group     BP 02/09/23 1300 114/67     Systolic BP Percentile --      Diastolic BP Percentile --      Pulse Rate 02/09/23 1300 73     Resp 02/09/23 1300 18     Temp 02/09/23 1300 98.4 F (36.9 C)     Temp Source 02/09/23 1300 Oral     SpO2 02/09/23 1300 96 %     Weight 02/09/23 1256 235 lb 14.3 oz (107 kg)     Height 02/09/23 1256 6\' 2"  (1.88 m)     Head Circumference --      Peak Flow --      Pain Score 02/09/23 1256 8     Pain Loc --      Pain Education --      Exclude from Growth Chart --     Most recent vital signs: Vitals:   02/09/23 1300 02/09/23 1648  BP: 114/67 120/70  Pulse: 73 70  Resp: 18 18  Temp: 98.4 F (36.9 C)   SpO2: 96% 98%    General: Awake, no distress.  CV:  Good peripheral perfusion.  Regular rate and rhythm  Resp:  Normal effort.  Equal breath sounds bilaterally.  Abd:  No distention.   Other:  Patient has 2 puncture wounds to the right wrist/dorsal aspect of the hand.  There is mild amount of swelling locally around the bite with mild erythema and moderate tenderness to the area.  Good range of motion in all digits,  neurovascularly intact distally.   ED Results / Procedures / Treatments    RADIOLOGY  X-ray does not appear to show any bony abnormality.  I do not see any foreign bodies. Radiology is read the x-ray as soft tissue edema no fracture or radiopaque foreign body.   MEDICATIONS ORDERED IN ED: Medications  Tdap (BOOSTRIX) injection 0.5 mL (0.5 mLs Intramuscular Given 02/09/23 1642)  amoxicillin-clavulanate (AUGMENTIN) 875-125 MG per tablet 1 tablet (1 tablet Oral Given 02/09/23 1642)     IMPRESSION / MDM / ASSESSMENT AND PLAN / ED COURSE  I reviewed the triage vital signs and the nursing notes.  Patient's presentation is most consistent with acute presentation with potential threat to life or bodily function.  Patient presents to the emergency department after a cat bite to the right hand/wrist.  Patient has mild amount of swelling erythema moderate tenderness to this area.  X-rays negative for bony abnormality or radiopaque foreign body.  Bite occurred last night around 11 PM.  I discussed with the patient  immediately starting antibiotics.  We will start the patient antibiotics in the emergency department and continue them twice daily.  We have updated the patient's tetanus shot in the emergency department as well as it has been approximately 10 years per patient.  Discussed very strict return precautions for any worsening swelling or other signs of infection such as fever.  Patient is agreeable to plan of care.  FINAL CLINICAL IMPRESSION(S) / ED DIAGNOSES   Cat bite  Rx / DC Orders   Augmentin Norco  Note:  This document was prepared using Dragon voice recognition software and may include unintentional dictation errors.   Minna Antis, MD 02/09/23 [id number]

## 2023-02-09 NOTE — ED Notes (Signed)
Pt verbalizes understanding of discharge instructions. Opportunity for questioning and answers were provided. Pt discharged from ED to home.   ? ?

## 2023-03-10 ENCOUNTER — Telehealth: Payer: Self-pay | Admitting: Cardiovascular Disease

## 2023-03-10 NOTE — Telephone Encounter (Signed)
   Name: Brandon Parks [facility]  DOB: [birth year]  MRN: [id number]  Primary Cardiologist: Lorine Bears, MD   Preoperative team, please contact this patient and set up a phone call appointment for further preoperative risk assessment. Please obtain consent and complete medication review. Thank you for your help.  I confirm that guidance regarding antiplatelet and oral anticoagulation therapy has been completed and, if necessary, noted below.  Regarding ASA therapy, we recommend continuation of ASA throughout the perioperative period.  However, if the surgeon feels that cessation of ASA is required in the perioperative period, it may be stopped 5-7 days prior to surgery with a plan to resume it as soon as felt to be feasible from a surgical standpoint in the post-operative period.   I also confirmed the patient resides in the state of [place]. As per [facility] Medical Board telemedicine laws, the patient must reside in the state in which the provider is licensed.   Napoleon Form, Leodis Rains, NP 03/10/2023, 10:22 AM [facility]

## 2023-03-10 NOTE — Telephone Encounter (Signed)
I s/w the pt about tele preop appt. Pt tells me that he saw a cardiologist with [facility] 03/05/23 who provided the cardiac clearance.   I asked the pt if this meant that he is going to continue with [facility]. Pt said no this was just to get the clearance taken care of and that he will keep Dr. Kirke Corin for his yrly appts.   I informed the pt that I will present this back to the pre op APP for review.

## 2023-03-10 NOTE — Telephone Encounter (Signed)
   Pre-operative Risk Assessment    Patient Name: Brandon Parks [facility]  DOB: 09-23-1980 MRN: [id number]      Request for Surgical Clearance    Procedure:   posterior L4/5 hemilaminectomy/discectomy   Date of Surgery:  Clearance 04/20/23                                 Surgeon:  Blair Promise Surgeon's Group or Practice Name:  [facility] Phone number:  [phone] Fax number:  [phone]   Type of Clearance Requested:   - Medical    Type of Anesthesia:  None    Additional requests/questions:    Courtney Heys   03/10/2023, 10:14 AM

## 2023-03-18 ENCOUNTER — Telehealth: Payer: Self-pay | Admitting: Cardiovascular Disease

## 2023-03-18 NOTE — Telephone Encounter (Signed)
   Pre-operative Risk Assessment    Patient Name: Brandon Parks [facility]  DOB: [birth year] MRN: [id number]    Request for Surgical Clearance    Procedure:   POSTERIOR L4/5 HEMILAMINECTOMY/DISCECTOMY  Date of Surgery:  Clearance 04/20/23                                Surgeon:  DR Algernon Huxley Surgeon's Group or Practice Name:  [facility] Phone number:  [phone] Fax number:  [phone]  Type of Clearance Requested:   - Medical    Type of Anesthesia:  Not Indicated   Additional requests/questions:    Signed, Dalia Heading   03/18/2023, 11:26 AM

## 2023-03-18 NOTE — Telephone Encounter (Signed)
Duplicate request.  Preoperative team, please see notes from 03/10/2023.  I will remove her request from preoperative pool.  Thank you.  Thomasene Ripple. Cornisha Zetino NP-C     03/18/2023, 2:42 PM [facility] [address] Office [phone] Fax [phone]

## 2023-03-25 ENCOUNTER — Telehealth: Payer: Self-pay | Admitting: *Deleted

## 2023-03-25 ENCOUNTER — Telehealth: Payer: Self-pay | Admitting: Cardiovascular Disease

## 2023-03-25 NOTE — Telephone Encounter (Signed)
Pt has decided he was to do his pre-op clearance here. Pt would like a c/b to schedule

## 2023-03-25 NOTE — Telephone Encounter (Signed)
[name], Beverly1 hour ago (10:01 AM)   BG Pt has decided he was to do his pre-op clearance here. Pt would like a c/b to schedule      Note   Braylenn, Mohammadi [phone]  Andreas Blower   Pt has been scheduled tele pre op appt 04/10/23. Med rec and consent are done.

## 2023-03-25 NOTE — Telephone Encounter (Signed)
[name], Beverly1 hour ago (10:01 AM)   BG Pt has decided he was to do his pre-op clearance here. Pt would like a c/b to schedule      Note   Marvell, Tollefson [phone]  Andreas Blower   Pt has been scheduled tele pre op appt 04/10/23. Med rec and consent are done.      Patient Consent for Virtual Visit        Ode Gollehon has provided verbal consent on 03/25/2023 for a virtual visit (video or telephone).   CONSENT FOR VIRTUAL VISIT FOR:  Felicity Coyer Kavan  By participating in this virtual visit I agree to the following:  I hereby voluntarily request, consent and authorize [facility] and its employed or contracted physicians, physician assistants, nurse practitioners or other licensed health care professionals (the Practitioner), to provide me with telemedicine health care services (the "Services") as deemed necessary by the treating Practitioner. I acknowledge and consent to receive the Services by the Practitioner via telemedicine. I understand that the telemedicine visit will involve communicating with the Practitioner through live audiovisual communication technology and the disclosure of certain medical information by electronic transmission. I acknowledge that I have been given the opportunity to request an in-person assessment or other available alternative prior to the telemedicine visit and am voluntarily participating in the telemedicine visit.  I understand that I have the right to withhold or withdraw my consent to the use of telemedicine in the course of my care at any time, without affecting my right to future care or treatment, and that the Practitioner or I may terminate the telemedicine visit at any time. I understand that I have the right to inspect all information obtained and/or recorded in the course of the telemedicine visit and may receive copies of available information for a reasonable fee.  I understand that some of the potential risks  of receiving the Services via telemedicine include:  Delay or interruption in medical evaluation due to technological equipment failure or disruption; Information transmitted may not be sufficient (e.g. poor resolution of images) to allow for appropriate medical decision making by the Practitioner; and/or  In rare instances, security protocols could fail, causing a breach of personal health information.  Furthermore, I acknowledge that it is my responsibility to provide information about my medical history, conditions and care that is complete and accurate to the best of my ability. I acknowledge that Practitioner's advice, recommendations, and/or decision may be based on factors not within their control, such as incomplete or inaccurate data provided by me or distortions of diagnostic images or specimens that may result from electronic transmissions. I understand that the practice of medicine is not an exact science and that Practitioner makes no warranties or guarantees regarding treatment outcomes. I acknowledge that a copy of this consent can be made available to me via my patient portal [facility] MyChart), or I can request a printed copy by calling the office of [facility].    I understand that my insurance will be billed for this visit.   I have read or had this consent read to me. I understand the contents of this consent, which adequately explains the benefits and risks of the Services being provided via telemedicine.  I have been provided ample opportunity to ask questions regarding this consent and the Services and have had my questions answered to my satisfaction. I give my informed consent for the services to be provided through the use of telemedicine in  my medical care

## 2023-04-10 ENCOUNTER — Ambulatory Visit: Payer: 59 | Attending: Nurse Practitioner

## 2023-04-10 DIAGNOSIS — Z01810 Encounter for preprocedural cardiovascular examination: Secondary | ICD-10-CM

## 2023-04-10 NOTE — Progress Notes (Signed)
Virtual Visit via Telephone Note   Because of Brandon Parks's co-morbid illnesses, he is at least at moderate risk for complications without adequate follow up.  This format is felt to be most appropriate for this patient at this time.  The patient did not have access to video technology/had technical difficulties with video requiring transitioning to audio format only (telephone).  All issues noted in this document were discussed and addressed.  No physical exam could be performed with this format.  Please refer to the patient's chart for his consent to telehealth for [facility].  Evaluation Performed:  Preoperative cardiovascular risk assessment _____________   Date:  04/10/2023   Patient ID:  Brandon Parks, DOB 1980/09/03, MRN [id number] Patient Location:  Home Provider location:   Office  Primary Care Provider:  Patient, No Pcp Per Primary Cardiologist:  Brandon Bears, MD  Chief Complaint / Patient Profile   42 y.o. 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  History of Present Illness    Brandon Parks is a 42 y.o. male who presents via audio/video conferencing for a telehealth visit today.  Pt was last seen in cardiology clinic on 10/30/2022 by Dr. Kirke Parks.  At that time Brandon Parks was doing well no complaints of chest pain but endorsed mild left leg claudication. The patient is now pending procedure as outlined above. Since his last visit, he has been doing well with no new cardiac complaints.  He is able to complete all ADLs without any difficulty with exception of occasional back pain that is limiting.  He denies chest pain, shortness of breath, lower extremity edema, fatigue, palpitations, melena, hematuria, hemoptysis, diaphoresis, weakness,  presyncope, syncope, orthopnea, and PND.   Past Medical History    Past Medical History:  Diagnosis Date   CAD (coronary artery disease)    Carotid artery occlusion    GERD (gastroesophageal reflux disease)    MI (myocardial infarction) (HCC) 2013   NSTEMI (non-ST elevated myocardial infarction) (HCC)    PAD (peripheral artery disease) (HCC)    Pneumothorax on left    Tendinitis    bilateral   Past Surgical History:  Procedure Laterality Date   ABDOMINAL AORTOGRAM N/A 06/25/2016   Procedure: Abdominal Aortogram;  Surgeon: Iran Ouch, MD;  Location: [facility] INVASIVE CV LAB;  Service: Cardiovascular;  Laterality: N/A;   ABDOMINAL AORTOGRAM W/LOWER EXTREMITY N/A 09/01/2018   Procedure: ABDOMINAL AORTOGRAM W/LOWER EXTREMITY;  Surgeon: Iran Ouch, MD;  Location: [facility] INVASIVE CV LAB;  Service: Cardiovascular;  Laterality: N/A;   AORTA - BILATERAL FEMORAL ARTERY BYPASS GRAFT Bilateral 11/08/2018   Procedure: AORTA BIFEMORAL BYPASS GRAFT USING HEMASHIELD GOLD 14X8MM VASCULAR GRAFT;  Surgeon: Larina Earthly, MD;  Location: [facility] OR;  Service: Vascular;  Laterality: Bilateral;   APPENDECTOMY     CARDIAC CATHETERIZATION  2013   @ [facility]; [place]   CARDIAC CATHETERIZATION  Aug 2013   @ [facility]   CORONARY ANGIOPLASTY  2013   @[facility]    CORONARY ARTERY BYPASS GRAFT  2013   @ [facility]   LOWER EXTREMITY ANGIOGRAPHY N/A 06/25/2016   Procedure: Lower Extremity Angiography;  Surgeon: Iran Ouch, MD;  Location: [facility];  Service: Cardiovascular;  Laterality: N/A;  Limited study   PERIPHERAL VASCULAR CATHETERIZATION N/A 02/28/2015   Procedure: Abdominal Aortogram;  Surgeon: Iran Ouch, MD;  Location: [facility] INVASIVE CV LAB;  Service: Cardiovascular;  Laterality: N/A;   PERIPHERAL VASCULAR INTERVENTION Bilateral 06/25/2016   Procedure: Peripheral Vascular Intervention;  Surgeon: Iran Ouch, MD;  Location: [facility]VAS[facility] CV [facility];  Service: Cardiovascular;  Laterality: Bilateral;  Bilateral  Kissing Iliac stents   PERIPHERAL VASCULAR INTERVENTION Bilateral 09/01/2018   Procedure: PERIPHERAL VASCULAR INTERVENTION;  Surgeon: Iran Ouch, MD;  Location: [facility] INVASIVE CV LAB;  Service: Cardiovascular;  Laterality: Bilateral;  bilateral common iliacs    Allergies  No Known Allergies  Home Medications    Prior to Admission medications   Medication Sig Start Date End Date Taking? Authorizing Provider  amoxicillin-clavulanate (AUGMENTIN) 875-125 MG tablet Take 1 tablet by mouth 2 (two) times daily. 02/09/23   Minna Antis, MD  aspirin EC 81 MG tablet Take 1 tablet (81 mg total) by mouth daily. Swallow whole. 10/30/22   Iran Ouch, MD  HYDROcodone-acetaminophen (NORCO/VICODIN) 5-325 MG tablet Take 1 tablet by mouth every 4 (four) hours as needed. 02/09/23   Minna Antis, MD  rosuvastatin (CRESTOR) 40 MG tablet Take 1 tablet (40 mg total) by mouth daily. 10/30/22   Iran Ouch, MD    Physical Exam    Vital Signs:  Brandon Parks does not have vital signs available for review today.100/70  Given telephonic nature of communication, physical exam is limited. AAOx3. NAD. Normal affect.  Speech and respirations are unlabored.  Accessory Clinical Findings    None  Assessment & Plan    1.  Preoperative Cardiovascular Risk Assessment: -Patient's RCRI score is 6.6%  The patient affirms he has been doing well without any new cardiac symptoms. They are able to achieve 6 METS without cardiac limitations. Therefore, based on ACC/AHA guidelines, the patient would be at acceptable risk for the planned procedure without further cardiovascular testing. The patient was advised that if he develops new symptoms prior to surgery to contact our office to arrange for a follow-up visit, and he verbalized understanding.   The patient was advised that if he develops new symptoms prior to surgery to contact our office to arrange for a follow-up visit, and he verbalized  understanding.  Patient recommended to continue ASA 81 mg through the perioperative period  A copy of this note will be routed to requesting surgeon.  Time:   Today, I have spent 6 minutes with the patient with telehealth technology discussing medical history, symptoms, and management plan.     Brandon Parks, Leodis Rains, NP  04/10/2023, 7:42 AM

## 2023-09-02 ENCOUNTER — Other Ambulatory Visit: Payer: Self-pay | Admitting: Cardiovascular Disease
